# Patient Record
Sex: Female | Born: 1935 | Race: White | Hispanic: No | Smoking: Never smoker
Health system: Southern US, Community
[De-identification: ages and names within clinical notes are randomized; demographics above are authoritative.]

## PROBLEM LIST (undated history)

## (undated) DIAGNOSIS — R112 Nausea with vomiting, unspecified: Secondary | ICD-10-CM

## (undated) DIAGNOSIS — M1990 Unspecified osteoarthritis, unspecified site: Secondary | ICD-10-CM

## (undated) DIAGNOSIS — K219 Gastro-esophageal reflux disease without esophagitis: Secondary | ICD-10-CM

## (undated) DIAGNOSIS — I10 Essential (primary) hypertension: Secondary | ICD-10-CM

## (undated) DIAGNOSIS — Z8489 Family history of other specified conditions: Secondary | ICD-10-CM

## (undated) DIAGNOSIS — I739 Peripheral vascular disease, unspecified: Secondary | ICD-10-CM

## (undated) DIAGNOSIS — D649 Anemia, unspecified: Secondary | ICD-10-CM

## (undated) DIAGNOSIS — C801 Malignant (primary) neoplasm, unspecified: Secondary | ICD-10-CM

## (undated) DIAGNOSIS — Z9889 Other specified postprocedural states: Secondary | ICD-10-CM

## (undated) DIAGNOSIS — K589 Irritable bowel syndrome without diarrhea: Secondary | ICD-10-CM

## (undated) HISTORY — PX: COLONOSCOPY: SHX174

## (undated) HISTORY — PX: TONSILLECTOMY: SUR1361

## (undated) HISTORY — PX: TUMOR REMOVAL: SHX12

## (undated) HISTORY — PX: ROTATOR CUFF REPAIR: SHX139

---

## 2019-05-27 ENCOUNTER — Other Ambulatory Visit: Payer: Self-pay

## 2019-05-27 ENCOUNTER — Other Ambulatory Visit (HOSPITAL_COMMUNITY): Payer: Self-pay | Admitting: Internal Medicine

## 2019-05-27 ENCOUNTER — Encounter (HOSPITAL_BASED_OUTPATIENT_CLINIC_OR_DEPARTMENT_OTHER): Payer: Medicare Other | Attending: Internal Medicine | Admitting: Internal Medicine

## 2019-05-27 ENCOUNTER — Ambulatory Visit (HOSPITAL_COMMUNITY)
Admission: RE | Admit: 2019-05-27 | Discharge: 2019-05-27 | Disposition: A | Discharge status: Home / Self Care | Payer: Medicare Other | Source: Ambulatory Visit | Attending: Internal Medicine | Admitting: Internal Medicine

## 2019-05-27 DIAGNOSIS — E785 Hyperlipidemia, unspecified: Secondary | ICD-10-CM | POA: Diagnosis not present

## 2019-05-27 DIAGNOSIS — I10 Essential (primary) hypertension: Secondary | ICD-10-CM | POA: Diagnosis not present

## 2019-05-27 DIAGNOSIS — L97223 Non-pressure chronic ulcer of left calf with necrosis of muscle: Secondary | ICD-10-CM | POA: Insufficient documentation

## 2019-05-27 DIAGNOSIS — M1990 Unspecified osteoarthritis, unspecified site: Secondary | ICD-10-CM | POA: Diagnosis not present

## 2019-05-27 DIAGNOSIS — Z833 Family history of diabetes mellitus: Secondary | ICD-10-CM | POA: Diagnosis not present

## 2019-05-27 DIAGNOSIS — Z885 Allergy status to narcotic agent status: Secondary | ICD-10-CM | POA: Insufficient documentation

## 2019-05-27 DIAGNOSIS — Z9221 Personal history of antineoplastic chemotherapy: Secondary | ICD-10-CM | POA: Insufficient documentation

## 2019-05-27 DIAGNOSIS — Z923 Personal history of irradiation: Secondary | ICD-10-CM | POA: Diagnosis not present

## 2019-05-27 DIAGNOSIS — Z9289 Personal history of other medical treatment: Secondary | ICD-10-CM

## 2019-05-27 DIAGNOSIS — C8330 Diffuse large B-cell lymphoma, unspecified site: Secondary | ICD-10-CM | POA: Insufficient documentation

## 2019-05-27 DIAGNOSIS — G629 Polyneuropathy, unspecified: Secondary | ICD-10-CM | POA: Diagnosis not present

## 2019-05-27 DIAGNOSIS — Z86718 Personal history of other venous thrombosis and embolism: Secondary | ICD-10-CM | POA: Insufficient documentation

## 2019-05-27 DIAGNOSIS — Z888 Allergy status to other drugs, medicaments and biological substances status: Secondary | ICD-10-CM | POA: Insufficient documentation

## 2019-05-27 DIAGNOSIS — I872 Venous insufficiency (chronic) (peripheral): Secondary | ICD-10-CM | POA: Diagnosis not present

## 2019-05-27 DIAGNOSIS — Z809 Family history of malignant neoplasm, unspecified: Secondary | ICD-10-CM | POA: Insufficient documentation

## 2019-05-27 NOTE — Progress Notes (Signed)
Sara, [name] ([id number]) Visit Report for 05/27/2019 Abuse/Suicide Risk Screen Details Patient Name: Date of Service: [place], [name] 05/27/2019 9:00 A M Medical Record Number: [id number] Patient Account Number: [id number] Date of Birth/Sex: Treating RN: March 25, 1929 (86 y.o. [name] Primary Care Rhilyn Battle: Other Clinician: Referring Daisuke Bailey: Treating Avory Rahimi/Extender: [name] in Treatment: 0 Abuse/Suicide Risk Screen Items Answer ABUSE RISK SCREEN: Has anyone close to you tried to hurt or harm you recentlyo No Do you feel uncomfortable with anyone in your familyo No Has anyone forced you do things that you didnt want to doo No Electronic Signature(s) Signed: 05/27/2019 5:10:43 PM By: Baruch Gouty RN, BSN Entered By: Baruch Gouty on 05/27/2019 09:49:04 -------------------------------------------------------------------------------- Activities of Daily Living Details Patient Name: Date of Service: [place], [name] 05/27/2019 9:00 A M Medical Record Number: [id number] Patient Account Number: [id number] Date of Birth/Sex: Treating RN: [birth year] (86 y.o. [name] Primary Care Jarrod Bodkins: Other Clinician: Referring Tenia Goh: Treating Magdeline Prange/Extender: [name] in Treatment: 0 Activities of Daily Living Items Answer Activities of Daily Living (Please select one for each item) Drive Automobile Completely Able T Medications ake Completely Able Use T elephone Completely Able Care for Appearance Completely Able Use T oilet Completely Able Bath / Shower Completely Able Dress Self Completely Able Feed Self Completely Able Walk Need Assistance Get In / Out Bed Completely Able Housework Need Assistance Prepare Meals Completely [place] for Self Completely Able Notes does not have current driver's Teacher, early years/pre) Signed: 05/27/2019 5:10:43 PM By: Baruch Gouty RN,  BSN Signed: 05/27/2019 5:10:43 PM By: Baruch Gouty RN, BSN Entered By: Baruch Gouty on 05/27/2019 09:49:49 -------------------------------------------------------------------------------- Education Screening Details Patient Name: Date of Service: [name], [name] 05/27/2019 9:00 A M Medical Record Number: [id number] Patient Account Number: [id number] Date of Birth/Sex: Treating RN: 12-03-1929 (86 y.o. [name] Primary Care Bellamarie Pflug: Other Clinician: Referring Madhavi Hamblen: Treating Easter Kennebrew/Extender: [name] in Treatment: 0 Primary Learner Assessed: Patient Learning Preferences/Education Level/Primary Language Learning Preference: Explanation, Demonstration, Printed Material Highest Education Level: College or Above Preferred Language: English Cognitive Barrier Language Barrier: No Translator Needed: No Memory Deficit: No Emotional Barrier: No Cultural/Religious Beliefs Affecting Medical Care: No Physical Barrier Impaired Vision: Yes Glasses, reading Impaired Hearing: No Decreased Hand dexterity: No Knowledge/Comprehension Knowledge Level: High Comprehension Level: High Ability to understand written instructions: High Ability to understand verbal instructions: High Motivation Anxiety Level: Calm Cooperation: Cooperative Education Importance: Acknowledges Need Interest in Health Problems: Asks Questions Perception: Coherent Willingness to Engage in Self-Management High Activities: Readiness to Engage in Self-Management High Activities: Electronic Signature(s) Signed: 05/27/2019 5:10:43 PM By: Baruch Gouty RN, BSN Entered By: Baruch Gouty on 05/27/2019 09:50:27 -------------------------------------------------------------------------------- Fall Risk Assessment Details Patient Name: Date of Service: [name], [name] 05/27/2019 9:00 A M Medical Record Number: [id number] Patient Account Number: [id number] Date of  Birth/Sex: Treating RN: November 03, 1929 (86 y.o. [name] Primary Care Artavia Jeanlouis: Other Clinician: Referring Jessika Rothery: Treating Alexina Niccoli/Extender: [name] in Treatment: 0 Fall Risk Assessment Items Have you had 2 or more falls in the last 12 monthso 0 No Have you had any fall that resulted in injury in the last 12 monthso 0 No FALLS RISK SCREEN History of falling - immediate or within 3 months 0 No Secondary diagnosis (Do you have 2 or more medical diagnoseso) 0 No Ambulatory aid None/bed rest/wheelchair/nurse 0 No Crutches/cane/walker 15 Yes Furniture 0 No Intravenous therapy Access/Saline/Heparin Lock 0 No  Gait/Transferring Normal/ bed rest/ wheelchair 0 Yes Weak (short steps with or without shuffle, stooped but able to lift head while walking, may seek 0 No support from furniture) Impaired (short steps with shuffle, may have difficulty arising from chair, head down, impaired 0 No balance) Mental Status Oriented to own ability 0 Yes Electronic Signature(s) Signed: 05/27/2019 5:10:43 PM By: Baruch Gouty RN, BSN Entered By: Baruch Gouty on 05/27/2019 09:52:06 -------------------------------------------------------------------------------- Foot Assessment Details Patient Name: Date of Service: [name], [name] 05/27/2019 9:00 A M Medical Record Number: [id number] Patient Account Number: [id number] Date of Birth/Sex: Treating RN: Sep 14, 1929 (86 y.o. [name] Primary Care Tsugio Elison: Other Clinician: Referring Samy Ryner: Treating Shyan Scalisi/Extender: [name] in Treatment: 0 Foot Assessment Items Site Locations + = Sensation present, - = Sensation absent, C = Callus, U = Ulcer R = Redness, W = Warmth, M = Maceration, PU = Pre-ulcerative lesion F = Fissure, S = Swelling, D = Dryness Assessment Right: Left: Other Deformity: No No Prior Foot Ulcer: No No Prior Amputation: No No Charcot Joint: No No Ambulatory Status:  Ambulatory With Help Assistance Device: Cane Gait: Steady Electronic Signature(s) Signed: 05/27/2019 5:10:43 PM By: Baruch Gouty RN, BSN Entered By: Baruch Gouty on 05/27/2019 09:56:45 -------------------------------------------------------------------------------- Nutrition Risk Screening Details Patient Name: Date of Service: [name], [name] 05/27/2019 9:00 A M Medical Record Number: [id number] Patient Account Number: [id number] Date of Birth/Sex: Treating RN: [birth year] (86 y.o. [name] Primary Care Drewey Begue: Other Clinician: Referring Tena Linebaugh: Treating Nayla Dias/Extender: [name] in Treatment: 0 Height (in): 65 Weight (lbs): 138 Body Mass Index (BMI): 23 Nutrition Risk Screening Items Score Screening NUTRITION RISK SCREEN: I have an illness or condition that made me change the kind and/or amount of food I eat 0 No I eat fewer than two meals per day 0 No I eat few fruits and vegetables, or milk products 0 No I have three or more drinks of beer, liquor or wine almost every day 0 No I have tooth or mouth problems that make it hard for me to eat 0 No I don't always have enough money to buy the food I need 0 No I eat alone most of the time 1 Yes I take three or more different prescribed or over-the-counter drugs a day 1 Yes Without wanting to, I have lost or gained 10 pounds in the last six months 0 No I am not always physically able to shop, cook and/or feed myself 0 No Nutrition Protocols Good Risk Protocol 0 No interventions needed Moderate Risk Protocol High Risk Proctocol Risk Level: Good Risk Score: 2 Electronic Signature(s) Signed: 05/27/2019 5:10:43 PM By: Baruch Gouty RN, BSN Entered By: Baruch Gouty on 05/27/2019 09:52:39

## 2019-05-27 NOTE — Progress Notes (Signed)
[name], PAGE[place] ([id number]) Visit Report for 05/27/2019 Allergy List Details Patient Name: Date of Service: [name], [name] 05/27/2019 9:00 A M Medical Record Number: [id number] Patient Account Number: [id number] Date of Birth/Sex: Treating RN: 12-14-1929 (86 y.o. [name] Primary Care [name]: Other Clinician: Referring [name]: Treating [name]/Extender: [name] in Treatment: 0 Allergies Active Allergies apixaban Reaction: itching prednisone Reaction: lethargy hydrocodone Reaction: vomiting Allergy Notes Electronic Signature(s) Signed: 05/27/2019 5:10:43 PM By: Baruch Gouty RN, BSN Entered By: Baruch Gouty on 05/27/2019 09:34:53 -------------------------------------------------------------------------------- Arrival Information Details Patient Name: Date of Service: Sara Benne, [name] 05/27/2019 9:00 A M Medical Record Number: [id number] Patient Account Number: [id number] Date of Birth/Sex: Treating RN: [birth year] (86 y.o. [name] Primary Care [name]: Other Clinician: Referring [name]: Treating [name]/Extender: [name] in Treatment: 0 Visit Information Patient Arrived: Wheel Chair Arrival Time: 09:18 Accompanied By: friend Transfer Assistance: None Patient Identification Verified: Yes Secondary Verification Process Completed: Yes Patient Requires Transmission-Based Precautions: No Patient Has Alerts: No Electronic Signature(s) Signed: 05/27/2019 5:10:43 PM By: Baruch Gouty RN, BSN Entered By: Baruch Gouty on 05/27/2019 [id number] -------------------------------------------------------------------------------- Clinic Level of Care Assessment Details Patient Name: Date of Service: [name], [name] 05/27/2019 9:00 A M Medical Record Number: [id number] Patient Account Number: [id number] Date of Birth/Sex: Treating RN: 06-06-1929 (86 y.o. [name] Primary Care [name]: Other  Clinician: Referring [name]: Treating [name]/Extender: [name] in Treatment: 0 Clinic Level of Care Assessment Items TOOL 2 Quantity Score X- 1 0 Use when only an EandM is performed on the INITIAL visit ASSESSMENTS - Nursing Assessment / Reassessment X- 1 20 General Physical Exam (combine w/ comprehensive assessment (listed just below) when performed on new pt. evals) X- 1 25 Comprehensive Assessment (HX, ROS, Risk Assessments, Wounds Hx, etc.) ASSESSMENTS - Wound and Skin A ssessment / Reassessment X - Simple Wound Assessment / Reassessment - one wound 1 5 []  - 0 Complex Wound Assessment / Reassessment - multiple wounds []  - 0 Dermatologic / Skin Assessment (not related to wound area) ASSESSMENTS - Ostomy and/or Continence Assessment and Care []  - 0 Incontinence Assessment and Management []  - 0 Ostomy Care Assessment and Management (repouching, etc.) PROCESS - Coordination of Care X - Simple Patient / Family Education for ongoing care 1 15 []  - 0 Complex (extensive) Patient / Family Education for ongoing care X- 1 10 Staff obtains Programmer, systems, Records, T Results / Process Orders est []  - 0 Staff telephones HHA, Nursing Homes / Clarify orders / etc []  - 0 Routine Transfer to another Facility (non-emergent condition) []  - 0 Routine Hospital Admission (non-emergent condition) X- 1 15 New Admissions / Biomedical engineer / Ordering NPWT Apligraf, etc. , []  - 0 Emergency Hospital Admission (emergent condition) X- 1 10 Simple Discharge Coordination []  - 0 Complex (extensive) Discharge Coordination PROCESS - Special Needs []  - 0 Pediatric / Minor Patient Management []  - 0 Isolation Patient Management []  - 0 Hearing / Language / Visual special needs []  - 0 Assessment of Community assistance (transportation, D/C planning, etc.) []  - 0 Additional assistance / Altered mentation []  - 0 Support Surface(s) Assessment (bed, cushion, seat,  etc.) INTERVENTIONS - Wound Cleansing / Measurement X- 1 5 Wound Imaging (photographs - any number of wounds) []  - 0 Wound Tracing (instead of photographs) X- 1 5 Simple Wound Measurement - one wound []  - 0 Complex Wound Measurement - multiple wounds X- 1 5 Simple Wound Cleansing - one wound []  -  0 Complex Wound Cleansing - multiple wounds INTERVENTIONS - Wound Dressings []  - 0 Small Wound Dressing one or multiple wounds X- 1 15 Medium Wound Dressing one or multiple wounds []  - 0 Large Wound Dressing one or multiple wounds []  - 0 Application of Medications - injection INTERVENTIONS - Miscellaneous []  - 0 External ear exam []  - 0 Specimen Collection (cultures, biopsies, blood, body fluids, etc.) []  - 0 Specimen(s) / Culture(s) sent or taken to Lab for analysis []  - 0 Patient Transfer (multiple staff / Harrel Lemon Lift / Similar devices) []  - 0 Simple Staple / Suture removal (25 or less) []  - 0 Complex Staple / Suture removal (26 or more) []  - 0 Hypo / Hyperglycemic Management (close monitor of Blood Glucose) X- 1 15 Ankle / Brachial Index (ABI) - do not check if billed separately Has the patient been seen at the hospital within the last three years: Yes Total Score: 145 Level Of Care: New/Established - Level 4 Electronic Signature(s) Signed: 05/27/2019 5:53:12 PM By: Sara Coria RN Entered By: [name] on 05/27/2019 17:22:22 -------------------------------------------------------------------------------- Encounter Discharge Information Details Patient Name: Date of Service: Sara Benne, [name] 05/27/2019 9:00 A M Medical Record Number: [id number] Patient Account Number: [id number] Date of Birth/Sex: Treating RN: 1929/08/08 (86 y.o. [name] Primary Care [name]: Other Clinician: Referring [name]: Treating [name]/Extender: [name] in Treatment: 0 Encounter Discharge Information Items Discharge Condition: Stable Ambulatory Status:  Wheelchair Discharge Destination: Home Transportation: Private Auto Accompanied By: friend Schedule Follow-up Appointment: Yes Clinical Summary of Care: Patient Declined Electronic Signature(s) Signed: 05/27/2019 5:14:55 PM By: Kela Millin Entered By: Kela Millin on 05/27/2019 11:07:21 -------------------------------------------------------------------------------- Lower Extremity Assessment Details Patient Name: Date of Service: [place], [name] 05/27/2019 9:00 A M Medical Record Number: [id number] Patient Account Number: [id number] Date of Birth/Sex: Treating RN: 1929-09-26 (86 y.o. [name] Primary Care Gisele Pack: Other Clinician: Referring Morry Veiga: Treating Zidane Renner/Extender: [name] in Treatment: 0 Edema Assessment Assessed: [name]: No] [Right: No] Edema: [Left: Ye] [Right: s] Calf Left: Right: Point of Measurement: cm From Medial Instep 34.8 cm cm Ankle Left: Right: Point of Measurement: cm From Medial Instep 20.1 cm cm Vascular Assessment Pulses: Dorsalis Pedis Palpable: [Left:Yes] Notes ABIs done at [facility] 05/09/19 Electronic Signature(s) Signed: 05/27/2019 5:10:43 PM By: Baruch Gouty RN, BSN Entered By: Baruch Gouty on 05/27/2019 10:01:25 -------------------------------------------------------------------------------- Multi Wound Chart Details Patient Name: Date of Service: Sara Benne, [name] 05/27/2019 9:00 A M Medical Record Number: [id number] Patient Account Number: [id number] Date of Birth/Sex: Treating RN: 04-05-1929 (86 y.o. 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] [N/A:N/A] Primary Etiology: [1:Cataracts, Deep Vein Thrombosis,] [N/A:N/A] Comorbid History: [1:Hypertension, Osteoarthritis, Neuropathy, Received Chemotherapy, Received Radiation, Confinement Anxiety 10/17/2018] [N/A:N/A] Date Acquired: [1:0] [N/A:N/A] Weeks of Treatment: [1:Open] [N/A:N/A] Wound Status: [1:6.8x3.7x0.4] [N/A:N/A] Measurements L x W x D (cm) [1:19.761] [N/A:N/A] A (cm) : rea [1:7.904] [N/A:N/A] Volume (cm) : [1:Full Thickness With Exposed Support] [N/A:N/A] Classification: [1:Structures Medium] [N/A:N/A] Exudate Amount: [1:Purulent] [N/A:N/A] Exudate Type: [1:yellow, brown, green] [N/A:N/A] Exudate Color: [1:Flat and Intact] [N/A:N/A] Wound Margin: [1:Small (1-33%)] [N/A:N/A] Granulation Amount: [1:Pink] [N/A:N/A] Granulation Quality: [1:Large (67-100%)] [N/A:N/A] Necrotic Amount: [1:Fat Layer (Subcutaneous Tissue)] [N/A:N/A] Exposed Structures: [1:Exposed:  Yes Muscle: Yes Fascia: No Tendon: No Joint: No Bone: No None] [N/A:N/A] Treatment Notes Wound #1 (Left, Lateral Lower Leg) 1. Cleanse With Wound Cleanser 2. Periwound Care Skin Prep 3. Primary Dressing Applied [name] Other primary dressing (specifiy in notes) 4. Secondary Dressing ABD Pad Dry Gauze Roll Gauze 5. Secured With Tape Notes saline moistened gauze over santyl. netting Electronic Signature(s) Signed: 05/27/2019 5:53:12 PM By: Sara Coria RN Signed: 05/27/2019 6:13:26 PM By: Linton Ham MD Entered By: Linton Ham on 05/27/2019 13:13:22 -------------------------------------------------------------------------------- Multi-Disciplinary Care Plan Details Patient Name: Date of Service: [place], [name] 05/27/2019 9:00 A M Medical Record Number: [id number] Patient Account Number: [id number] Date of Birth/Sex: Treating RN: 1929/02/08 (86 y.o. [name] Primary Care Fleet Higham: Other  Clinician: Referring Derin Matthes: Treating Jenya Putz/Extender: [name] in Treatment: 0 Active Inactive HBO Nursing Diagnoses: Anxiety related to feelings of confinement associated with the hyperbaric oxygen chamber Anxiety related to knowledge deficit of hyperbaric oxygen therapy and treatment procedures Goals: Patient and/or family will be able to state/discuss factors appropriate to the management of their disease process during treatment Date Initiated: 05/27/2019 Target Resolution Date: 06/27/2019 Goal Status: Active Patient will tolerate the hyperbaric oxygen therapy treatment Date Initiated: 05/27/2019 T arget Resolution Date: 06/27/2019 Goal Status: Active Patient will tolerate the internal climate of the chamber Date Initiated: 05/27/2019 T arget Resolution Date: 06/27/2019 Goal Status: Active Patient/caregiver will verbalize understanding of HBO goals, rationale, procedures and potential hazards Date Initiated: 05/27/2019 T arget Resolution Date: 06/27/2019 Goal Status: Active Interventions: Administer a five (5) minute air break for patient if signs and symptoms of seizure appear and notify the hyperbaric physician Administer decongestants, per physician orders, prior to HBO2 Administer the correct therapeutic gas delivery based on the patients needs and limitations, per physician order Assess and provide for patients comfort related to the hyperbaric environment and equalization of middle ear Assess for signs and symptoms related to adverse events, including but not limited to confinement anxiety, pneumothorax, oxygen toxicity and baurotrauma Assess patient's knowledge and expectations regarding hyperbaric medicine and provide education related to the hyperbaric environment, goals of treatment and prevention of adverse events Notes: Wound/Skin Impairment Nursing Diagnoses: Knowledge deficit related to ulceration/compromised skin integrity Goals: Patient/caregiver  will verbalize understanding of skin care regimen Date Initiated: 05/27/2019 Target Resolution Date: 06/27/2019 Goal Status: Active Ulcer/skin breakdown will have a volume reduction of 30% by week 4 Date Initiated: 05/27/2019 Target Resolution Date: 06/27/2019 Goal Status: Active Interventions: Assess patient/caregiver ability to obtain necessary supplies Assess patient/caregiver ability to perform ulcer/skin care regimen upon admission and as needed Assess ulceration(s) every visit Notes: Electronic Signature(s) Signed: 05/27/2019 5:53:12 PM By: Sara Coria RN Entered By: [name] on 05/27/2019 10:41:02 -------------------------------------------------------------------------------- Pain Assessment Details Patient Name: Date of Service: [name], [name] 05/27/2019 9:00 A M Medical Record Number: [id number] Patient Account Number: [id number] Date of Birth/Sex: Treating RN: Dec 29, 1929 (86 y.o. [name] Primary Care Zahlia Deshazer: Other Clinician: Referring Zackerie Sara: Treating Kathy Wares/Extender: [name] in Treatment: 0 Active Problems Location of Pain Severity and Description of Pain Patient Has Paino Yes Site Locations Pain Location: Pain Location: Pain in Ulcers With Dressing Change: Yes Duration of the Pain. Constant / Intermittento Constant Rate the pain. Current Pain Level: 3 Worst Pain Level: 10 Least Pain Level: 2 Character of Pain Describe the Pain: Sharp, Shooting, Stabbing Pain Management and Medication Current Pain Management: Medication: Yes Is the Current Pain Management Adequate: Adequate Rest: Yes How does your wound impact  your activities of daily livingo Sleep: Yes Bathing: No Appetite: No Relationship With Others: No Bladder Continence: No Emotions: Yes Bowel Continence: No Work: No Toileting: No Drive: No Dressing: No Hobbies: No Electronic Signature(s) Signed: 05/27/2019 5:10:43 PM By: Baruch Gouty RN,  BSN Entered By: Baruch Gouty on 05/27/2019 10:09:07 -------------------------------------------------------------------------------- Patient/Caregiver Education Details Patient Name: Date of Service: Sara Benne, [name] 5/11/2021andnbsp9:00 A M Medical Record Number: [id number] Patient Account Number: [id number] Date of Birth/Gender: Treating RN: [birth year] (86 y.o. [name] Primary Care Physician: Other Clinician: Referring Physician: Treating Physician/Extender: [name] in Treatment: 0 Education Assessment Education Provided To: Patient Education Topics Provided Wound/Skin Impairment: Methods: Explain/Verbal Responses: State content correctly Electronic Signature(s) Signed: 05/27/2019 5:53:12 PM By: Sara Coria RN Entered By: [name] on 05/27/2019 10:41:13 -------------------------------------------------------------------------------- Wound Assessment Details Patient Name: Date of Service: [place], [name] 05/27/2019 9:00 A M Medical Record Number: [id number] Patient Account Number: [id number] Date of Birth/Sex: Treating RN: Sep 08, 1929 (86 y.o. 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 Location: Left, Lateral Lower Leg Wound Open Wounding Event: Bump Status: Date Acquired: 10/17/2018 Comorbid Cataracts, Deep Vein Thrombosis, Hypertension, Osteoarthritis, Weeks Of Treatment: 0 History: Neuropathy, Received Chemotherapy, Received Radiation, Clustered Wound: No Confinement Anxiety Wound Measurements Length: (cm) 6.8 Width: (cm) 3.7 Depth: (cm) 0.4 Area: (cm) 19.761 Volume: (cm) 7.904 % Reduction in Area: % Reduction in Volume: Epithelialization: None Tunneling: No Undermining: No Wound Description Classification: Full Thickness With Exposed  Support Structures Wound Margin: Flat and Intact Exudate Amount: Medium Exudate Type: Purulent Exudate Color: yellow, brown, green Foul Odor After Cleansing: No Slough/Fibrino Yes Wound Bed Granulation Amount: Small (1-33%) Exposed Structure Granulation Quality: Pink Fascia Exposed: No Necrotic Amount: Large (67-100%) Fat Layer (Subcutaneous Tissue) Exposed: Yes Necrotic Quality: Adherent Slough Tendon Exposed: No Muscle Exposed: Yes Necrosis of Muscle: No Joint Exposed: No Bone Exposed: No Treatment Notes Wound #1 (Left, Lateral Lower Leg) 1. Cleanse With Wound Cleanser 2. Periwound Care Skin Prep 3. Primary Dressing Applied [name] Other primary dressing (specifiy in notes) 4. Secondary Dressing ABD Pad Dry Gauze Roll Gauze 5. Secured With Tape Notes saline moistened gauze over santyl. netting Electronic Signature(s) Signed: 05/27/2019 5:10:43 PM By: Baruch Gouty RN, BSN Entered By: Baruch Gouty on 05/27/2019 10:07:47 -------------------------------------------------------------------------------- Vitals Details Patient Name: Date of Service: Sara Benne, [name] 05/27/2019 9:00 A M Medical Record Number: [id number] Patient Account Number: [id number] Date of Birth/Sex: Treating RN: 18-Dec-1929 (86 y.o. [name] Primary Care Arneda Sappington: Other Clinician: Referring Vaidehi Braddy: Treating Ausencio Vaden/Extender: [name] in Treatment: 0 Vital Signs Time Taken: 09:26 Temperature (F): 97.8 Height (in): 65 Pulse (bpm): 62 Source: Stated Respiratory Rate (breaths/min): 18 Weight (lbs): 138 Blood Pressure (mmHg): 115/60 Source: Stated Reference Range: 80 - 120 mg / dl Body Mass Index (BMI): 23 Electronic Signature(s) Signed: 05/27/2019 5:10:43 PM By: Baruch Gouty RN, BSN Entered By: Baruch Gouty on 05/27/2019 09:33:40

## 2019-05-27 NOTE — Progress Notes (Signed)
[name] ([id number]) Visit Report for 05/27/2019 Chief Complaint Document Details Patient Name: Date of Service: [name], [name] 05/27/2019 9:00 A M Medical Record Number: [id number] Patient Account Number: [id number] Date of Birth/Sex: Treating RN: 01/06/1930 (86 y.o. [name] Primary Care Provider: Other Clinician: Referring Provider: Treating Provider/Extender: [name] in Treatment: 0 Information Obtained from: Patient Chief Complaint 05/27/2019; patient is here for review of the wound on her left lateral lower leg Electronic Signature(s) Signed: 05/27/2019 6:13:26 PM By: Linton Ham MD Entered By: Linton Ham on 05/27/2019 13:14:12 -------------------------------------------------------------------------------- HPI Details Patient Name: Date of Service: Sara Benne, [name] 05/27/2019 9:00 A M Medical Record Number: [id number] Patient Account Number: [id number] Date of Birth/Sex: Treating RN: May 31, 1929 (86 y.o. [name] Primary Care Provider: Other Clinician: Referring Provider: Treating Provider/Extender: [name] in Treatment: 0 History of Present Illness HPI Description: ADMISSION 05/27/2019 This is an 86 year old woman who came here for consideration of hyperbaric oxygen. She has a very complicated history. She is been treated for diffuse large B-cell lymphoma since 2006. She has had 2 recurrences of this. Her most recent chemotherapy round was Treanda plus polatuzumab which ended I believe in February. She is not currently on chemotherapy. She came to see [place] with a large wound on the left lateral lower leg. Apparently this started as a lump sometime in October 2020. She received courses of antibiotics which did not help. She then had a biopsy and it actually showed lymphoma. The wound never really healed. And she developed a progressive ulcerated tumor. She underwent a course of radiation in late October into  November 2020. This resulted in some improvement in the mass but she has been left with a deep punched out wound with exposed tendon. She was seen by Dr. Jaclynn Guarneri at the wound care center in [facility]. She considered her for operative debridement and/or consideration of hyperbaric oxygen. She was also seen in April on 05/07/2019 by Dr. Zigmund Daniel at the wound care center in [facility]. Arterial and venous reflux studies were ordered but I am unable to see these results. 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 She has been using [name] for a prolonged period of time for perhaps 5 weeks with some improvement in the wound surface. She has not had the wound rebiopsied but she did have a PET scan on 03/18/2027 that did show residual infiltrating soft tissue density involving the left lower calf but without discrete measurable mass and minimal uptake. 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 At the same time there was no new areas of metabolically active lymphoma identified. She therefore has not been on any ongoing chemotherapy. She also had a CT scan of the left lower extremity on 05/21/2019 that did not show any fracture or dislocation of the left tibia or fibula large soft tissue ulcer overlying the distal tibia no evidence of bony erosion or sclerosis to suggest osteomyelitis. 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 , The patient has had prior recent arterial studies we did not do an ABI in this clinic  today. Electronic Signature(s) Signed: 05/27/2019 6:13:26 PM By: Linton Ham  MD Entered By: Linton Ham on 05/27/2019 18:04:17 -------------------------------------------------------------------------------- Physical Exam Details Patient Name: Date of Service: Sara RER, [name] 05/27/2019 9:00 A M Medical Record Number: [id number] Patient Account Number: [id number] Date of Birth/Sex: Treating RN: October 02, 1929 (86 y.o. [name] Primary Care Provider: Other Clinician: Referring Provider: Treating Provider/Extender: [name] in Treatment: 0 Constitutional Sitting or standing Blood Pressure is within target range for patient.. Pulse regular and within target range for patient.Marland Kitchen Respirations regular, non-labored and within target range.. Temperature is normal and within the target range for the patient.Marland Kitchen Appears in no distress. Respiratory work of breathing is normal. Cardiovascular Harsh systolic murmur 3 out of 6 radiating into the left greater than right carotid. Popliteal pulses palpable.. Dorsalis pedis and posterior tibial pulses are palpable. Integumentary (Hair, Skin) No erythema around the wound. Psychiatric appears at normal baseline. Notes Wound exam; the patient has a large punched-out wound on the left lateral calf. Mid aspect of this has an exposed tendon. 100% of this is covered in tightly adherent yellow debris. This did not wash off with wound cleanser and gauze however elected not to go ahead and do mechanical debridement on this at this point. There is no undermining areas. Electronic Signature(s) Signed: 05/27/2019 6:13:26 PM By: Linton Ham MD Entered By: Linton Ham on 05/27/2019 18:05:52 -------------------------------------------------------------------------------- Physician Orders Details Patient Name: Date of Service: Sara RER, [name] 05/27/2019 9:00 A M Medical Record Number: [id number] Patient Account Number: [id number] Date of Birth/Sex: Treating RN: [birth year] (86 y.o. [name] Primary  Care Provider: Other Clinician: Referring Provider: Treating Provider/Extender: [name] in Treatment: 0 Verbal / Phone Orders: No Diagnosis Coding Follow-up Appointments Return Appointment in 1 week. Dressing Change Frequency Change dressing every day. Wound Cleansing Wound #1 Left,Lateral Lower Leg May shower and wash wound with soap and water. Primary Wound Dressing [name] Ointment Secondary Dressing Kerlix/Rolled Gauze - secure with tape Dry Gauze Hyperbaric Oxygen Therapy Indication: - soft tissue radiation necrosis If appropriate for treatment, begin HBOT per protocol: 2.0 ATA for 90 Minutes without A Breaks ir Total Number of Treatments: - 40 treatments One treatments per day (delivered Monday through Friday unless otherwise specified in Special Instructions below): A ntihistamine 30 minutes prior to HBO Treatment, difficulty clearing ears. Radiology X-ray, Chest - pre HBO [employer] EKG - pre HBO Electronic Signature(s) Signed: 05/27/2019 6:13:26 PM By: Linton Ham MD Previous Signature: 05/27/2019 5:53:12 PM Version By: Carlene Coria RN Entered By: Linton Ham on 05/27/2019 18:05:19 Prescription 05/27/2019 -------------------------------------------------------------------------------- Burback, Deasha A. Linton Ham MD Patient Name: Provider: May 04, 1929 [id number] Date of Birth: NPI#: [id number] Sex: DEA #: [phone] [id number] Phone #: License #: [place] Patient Address: [address] [place], [place] [address] [place], [place] [address] [phone] Allergies Name Reaction Severity apixaban itching prednisone lethargy hydrocodone vomiting Provider's Orders EKG - pre HBO Hand Signature: Date(s): Prescription 05/27/2019 Creque, Jen A. Linton Ham MD Patient Name: Provider: 1929-08-10 [id number] Date of Birth: NPI#: [id number]  [id number] Sex: DEA #: [phone] [id number] Phone #: License #: [place] Patient Address: [address] [place], [place] [address] [place],  [address] [phone] Allergies Name Name Reaction Severity apixaban itching prednisone lethargy hydrocodone vomiting Provider's Orders X-ray, Chest - pre HBO Hand Signature: Date(s): Electronic Signature(s)  Signed: 05/27/2019 6:13:26 PM By: Linton Ham MD Previous Signature: 05/27/2019 5:53:12 PM Version By: Carlene Coria RN Entered By: Linton Ham on 05/27/2019 18:05:20 -------------------------------------------------------------------------------- Problem List Details Patient Name: Date of Service: [facility], [name] 05/27/2019 9:00 A M Medical Record Number: [id number] Patient Account Number: [id number] Date of Birth/Sex: Treating RN: 10/19/1929 (86 y.o. 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 05/27/2019 9:00 A M Medical Record Number: [id number] Patient Account Number: [id number] Date of Birth/Sex: Treating RN: [birth year] (86 y.o. [name] Primary Care Provider: Other  Clinician: Referring Provider: Treating Provider/Extender: [name] in Treatment: 0 Subjective Chief Complaint Information obtained from Patient 05/27/2019; patient is here for review of the wound on her left lateral lower leg History of Present Illness (HPI) ADMISSION 05/27/2019 This is an 86 year old woman who came here for consideration of hyperbaric oxygen. She has a very complicated history. She is been treated for diffuse large B-cell lymphoma since 2006. She has had 2 recurrences of this. Her most recent chemotherapy round was Treanda plus polatuzumab which ended I believe in February. She is not currently on chemotherapy. She came to see [place] with a large wound on the left lateral lower leg. Apparently this started as a lump sometime in October 2020. She received courses of antibiotics which did not help. She then had a biopsy and it actually showed lymphoma. The wound never really healed. And she developed a progressive ulcerated tumor. She underwent a course of radiation in late October into November 2020. This resulted in some improvement in the mass but she has been left with a deep punched out wound with exposed tendon. She was seen by Dr. Jaclynn Guarneri at the wound care center in [facility]. She considered her for operative debridement and/or consideration of hyperbaric oxygen. She was also seen in April on 05/07/2019 by Dr. Zigmund Daniel at the wound care center in [facility]. Arterial and venous reflux studies were ordered but I am unable to see these results. 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 She has been using [name] for a prolonged period of time for perhaps 5 weeks with some improvement in the wound surface. She has not had the wound  rebiopsied but she did have a PET scan on 03/18/2027 that did show residual infiltrating soft tissue density involving the left lower calf but without discrete measurable mass and minimal uptake. 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 At the same time there was no new areas of metabolically active lymphoma identified. She therefore has not been on any ongoing chemotherapy. She also had a CT scan of the left lower extremity on 05/21/2019 that did not show  any fracture or dislocation of the left tibia or fibula large soft tissue ulcer overlying the distal tibia no evidence of bony erosion or sclerosis to suggest osteomyelitis. 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 , The patient has had prior recent arterial studies we did not do an ABI in this clinic today. Patient History Information obtained from Patient. Allergies apixaban (Reaction: itching), prednisone (Reaction: lethargy), hydrocodone (Reaction: vomiting) Family History Cancer - Mother, Diabetes - Child, No family history of Heart Disease, Hereditary Spherocytosis, Hypertension, Kidney Disease, Lung Disease, Seizures, Stroke, Thyroid Problems, Tuberculosis. Social History Never smoker, Marital Status - Widowed, Alcohol Use - Daily - 1 cocktail per night, Drug Use - No History, Caffeine Use - Daily - coffee. 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 Medical A Surgical History Notes nd Constitutional Symptoms (General Health) pain affecting ADLs Cardiovascular hyperlipidemia Gastrointestinal GERD, constipatioin Oncologic large cell malignant lymphoma, nonhodgkin's lymphoma Review of Systems (ROS) Constitutional Symptoms (General Health) Complains or has symptoms of Fatigue. Eyes Complains or has symptoms of Glasses / Contacts - reading. Ear/Nose/Mouth/Throat Denies complaints or symptoms of Chronic sinus problems or rhinitis. Respiratory Denies complaints or symptoms of Chronic or frequent coughs, Shortness of Breath. Gastrointestinal Complains or has symptoms of Nausea - due to pain meds. Endocrine Denies complaints or symptoms of Heat/cold intolerance. Genitourinary Denies complaints or symptoms of Frequent urination. Integumentary (Skin) Complains or has symptoms of Wounds - left lower leg. Musculoskeletal Denies complaints or symptoms of Muscle Pain, Muscle Weakness. Neurologic Denies complaints or symptoms of Numbness/parasthesias. Psychiatric Denies complaints or symptoms of Claustrophobia, Suicidal. Objective Constitutional Sitting or standing Blood Pressure is within target range for patient.. Pulse regular and within target range for patient.Marland Kitchen Respirations regular, non-labored and within target range.. Temperature is normal and within the target range for the patient.Marland Kitchen Appears in no distress. Vitals Time Taken: 9:26 AM, Height: 65 in, Source: Stated, Weight: 138 lbs, Source: Stated, BMI: 23, Temperature: 97.8 F, Pulse: 62 bpm, Respiratory Rate: 18 breaths/min, Blood Pressure: 115/60 mmHg. Respiratory work of breathing is normal. Cardiovascular Harsh systolic  murmur 3 out of 6 radiating into the left greater than right carotid. Popliteal pulses palpable.. Dorsalis pedis and posterior tibial pulses are palpable. Psychiatric appears at normal baseline. General Notes: Wound exam; the patient has a large punched-out wound on the left lateral calf. Mid aspect of this has an exposed tendon. 100% of this is covered in tightly adherent yellow debris. This did not wash off with wound cleanser and gauze however elected not to go ahead and do mechanical debridement on this at this point. There is no undermining areas. Integumentary (Hair, Skin) No erythema around the wound. Wound #1 status is Open. Original cause of wound was Bump. The wound is located on the Left,Lateral Lower Leg. The wound measures 6.8cm length x 3.7cm width x 0.4cm depth; 19.761cm^2 area and 7.904cm^3 volume. There  is muscle and Fat Layer (Subcutaneous Tissue) Exposed exposed. There is no tunneling or undermining noted. There is a medium amount of purulent drainage noted. The wound margin is flat and intact. There is small (1-33%) pink granulation within the wound bed. There is a large (67-100%) amount of necrotic tissue within the wound bed including Adherent Slough. Assessment Active Problems ICD-10 Non-pressure chronic ulcer of left calf with necrosis of muscle Disorder of the skin and subcutaneous tissue related to radiation, unspecified Diffuse large B-cell lymphoma, unspecified site Plan Follow-up Appointments: Return Appointment in 1 week. Dressing Change Frequency: Change dressing every day. Wound Cleansing: Wound #1 Left,Lateral Lower Leg: May shower and wash wound with soap and water. Primary Wound Dressing: [name] Ointment Secondary Dressing: Kerlix/Rolled Gauze - secure with tape Dry Gauze Hyperbaric Oxygen Therapy: Indication: - soft tissue radiation necrosis If appropriate for treatment, begin HBOT per protocol: 2.0 ATA for 90 Minutes without Air Breaks T Number  of Treatments: - 40 treatments otal One treatments per day (delivered Monday through Friday unless otherwise specified in Special Instructions below): Antihistamine 30 minutes prior to HBO Treatment, difficulty clearing ears. ordered were: EKG - pre HBO Radiology ordered were: X-ray, Chest - pre HBO 1. I would continue the [name] as she is doing. The surface of the wound is certainly nonviable however apparently there has been some improvement 2. She has some degree of chronic venous insufficiency but does not tolerate compression. 3. She has had arterial and venous studies done ordered by Dr. Zigmund Daniel at [facility]. We cannot access these studies and I would like to see these 4. I think the patient p has radiation soft tissue damage as a cause for this worsening nonhealing wound. As such she is eligible for hyperbaric treatment. I would use 2 atm without air breaks for 40 treatments 5. I do not think that operative debridement is the correct course at this point I would like to treat her with hyperbarics first before considering any debridement. She has apparently also on anticoagulation according to her records although I did not see this on her medication list today. I will need to clarify this with her next visit 6. She has findings on exam compatible with aortic stenosis. I will need to research this further before committing her to hyperbarics. We will need to see if she has had an echocardiogram I spent 55 minutes in review of this patient's past medical history, face-to-face evaluation and preparation of this record Electronic Signature(s) Signed: 05/27/2019 6:13:26 PM By: Linton Ham MD Entered By: Linton Ham on 05/27/2019 18:12:23 -------------------------------------------------------------------------------- HxROS Details Patient Name: Date of Service: Sara Benne, [name] 05/27/2019 9:00 A M Medical Record Number: [id number] Patient Account Number: [id number] Date of  Birth/Sex: Treating RN: Oct 11, 1929 (86 y.o. [name] Primary Care Provider: Other Clinician: Referring Provider: Treating Provider/Extender: [name] in Treatment: 0 Information Obtained From Patient Constitutional Symptoms ([place]) Complaints and Symptoms: Positive for: Fatigue Medical History: Past Medical History Notes: pain affecting ADLs Eyes Complaints and Symptoms: Positive for: Glasses / Contacts - reading Medical History: Positive for: Cataracts - bil removed Negative for: Glaucoma; Optic Neuritis Ear/Nose/Mouth/Throat Complaints and Symptoms: Negative for: Chronic sinus problems or rhinitis Respiratory Complaints and Symptoms: Negative for: Chronic or frequent coughs; Shortness of Breath Gastrointestinal Complaints and Symptoms: Positive for: Nausea - due to pain meds Medical History: Past Medical History Notes: GERD, constipatioin Endocrine Complaints and Symptoms: Negative for: Heat/cold intolerance Genitourinary Complaints and Symptoms: Negative for: Frequent urination Medical  History: Negative for: End Stage Renal Disease Integumentary (Skin) Complaints and Symptoms: Positive for: Wounds - left lower leg Medical History: Negative for: History of Burn Musculoskeletal Complaints and Symptoms: Negative for: Muscle Pain; Muscle Weakness Medical History: Positive for: Osteoarthritis Negative for: Gout; Osteomyelitis Neurologic Complaints and Symptoms: Negative for: Numbness/parasthesias Medical History: Positive for: Neuropathy - right leg secondary to chemo Psychiatric Complaints and Symptoms: Negative for: Claustrophobia; Suicidal Medical History: Positive for: Confinement Anxiety - mild Negative for: Anorexia/bulimia Hematologic/Lymphatic Cardiovascular Medical History: Positive for: Deep Vein Thrombosis - left leg; Hypertension Past Medical History Notes: hyperlipidemia Immunological Oncologic Medical  History: Positive for: Received Chemotherapy; Received Radiation Past Medical History Notes: large cell malignant lymphoma, nonhodgkin's lymphoma HBO Extended History Items Eyes: Cataracts Immunizations Pneumococcal Vaccine: Received Pneumococcal Vaccination: Yes Implantable Devices Yes Hospitalization / Surgery History Type of Hospitalization/Surgery portacath insertion Family and Social History Cancer: Yes - Mother; Diabetes: Yes - Child; Heart Disease: No; Hereditary Spherocytosis: No; Hypertension: No; Kidney Disease: No; Lung Disease: No; Seizures: No; Stroke: No; Thyroid Problems: No; Tuberculosis: No; Never smoker; Marital Status - Widowed; Alcohol Use: Daily - 1 cocktail per night; Drug Use: No History; Caffeine Use: Daily - coffee; Financial Concerns: No; Food, Clothing or Shelter Needs: No; Support System Lacking: No; Transportation Concerns: Yes Electronic Signature(s) Signed: 05/27/2019 5:10:43 PM By: Baruch Gouty RN, BSN Signed: 05/27/2019 6:13:26 PM By: Linton Ham MD Entered By: Baruch Gouty on 05/27/2019 09:48:49 -------------------------------------------------------------------------------- SuperBill Details Patient Name: Date of Service: [name] 05/27/2019 Medical Record Number: [id number] Patient Account Number: [id number] Date of Birth/Sex: Treating RN: Sep 27, 1929 (86 y.o. [name] Primary Care Provider: Other Clinician: Referring Provider: Treating Provider/Extender: [name] in Treatment: 0 Diagnosis Coding ICD-10 Codes Code Description [phone] Non-pressure chronic ulcer of left calf with necrosis of muscle L59.9 Disorder of the skin and subcutaneous tissue related to radiation, unspecified C83.30 Diffuse large B-cell lymphoma, unspecified site Facility Procedures CPT4 Code: [id number] Description: 47158 - WOUND CARE VISIT-LEV 4 EST PT Modifier: Quantity: 1 Physician Procedures : CPT4 Code Description  Modifier [id number] - WC PHYS LEVEL 4 - NEW PT ICD-10 Diagnosis Description 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

## 2019-06-03 ENCOUNTER — Other Ambulatory Visit: Payer: Self-pay

## 2019-06-03 ENCOUNTER — Encounter (HOSPITAL_BASED_OUTPATIENT_CLINIC_OR_DEPARTMENT_OTHER): Payer: Medicare Other | Admitting: Internal Medicine

## 2019-06-03 ENCOUNTER — Ambulatory Visit (INDEPENDENT_AMBULATORY_CARE_PROVIDER_SITE_OTHER): Payer: Medicare Other | Admitting: Cardiovascular Disease

## 2019-06-03 ENCOUNTER — Encounter: Payer: Self-pay | Admitting: Cardiovascular Disease

## 2019-06-03 VITALS — BP 124/70 | Temp 97.3°F | Ht 65.0 in | Wt 140.6 lb

## 2019-06-03 DIAGNOSIS — R011 Cardiac murmur, unspecified: Secondary | ICD-10-CM | POA: Insufficient documentation

## 2019-06-03 DIAGNOSIS — R0989 Other specified symptoms and signs involving the circulatory and respiratory systems: Secondary | ICD-10-CM | POA: Diagnosis not present

## 2019-06-03 NOTE — Progress Notes (Signed)
06/03/2019 Bobbye Riggs   04-10-1929  [id number]  Primary Physician Patient, No Pcp Per Primary Cardiologist: Lorretta Harp MD Lupe Carney, [place]  HPI:  [name] is a 86 y.o. thin appearing widowed Caucasian female mother of 2 daughters, grandmother of 5 grandchildren referred by Dr. Dellia Nims for cardiovascular dilation because of an auscultated murmur.  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  She has no cardiac risk factors.  She drinks 1 shot of Shearon Stalls nightly.  There is no family history of heart disease.  She is never had heart attack or stroke.  She has chronic mild dyspnea but denies chest pain.  She has had non-Hodgkin's lymphoma since 2006 and has been undergoing chemotherapy.  She had a lesion on her leg and underwent radiation therapy and developed a radiation-induced wound and apparently needs hyperbaric oxygen.  A murmur was auscultated and she was referred here for further evaluation.   Current Meds  Medication Sig  . citalopram (CELEXA) 20 MG tablet TAKE 1 TABLET DAILY  . collagenase ([name]) ointment   . diclofenac Sodium (VOLTAREN) 1 % GEL Apply topically twice a day as needed  . lidocaine-prilocaine (EMLA) cream Apply to wound daily  . omeprazole (PRILOSEC) 20 MG capsule TAKE 1 CAPSULE DAILY  . ondansetron (ZOFRAN) 8 MG tablet Take by mouth.     Allergies  Allergen Reactions  . Prednisone Other (See Comments)  . Apixaban Itching    Rash, muscle pain, and and weakness also presented  . Hydrocodone-Acetaminophen Nausea And Vomiting    Social History   Socioeconomic History  . Marital status: Widowed    Spouse name: Not on file  . Number of children: Not on file  . Years of education: Not on file  . Highest education level: Not on file  Occupational History  . Not on file  Tobacco Use  . Smoking status: Not on file  Substance and Sexual Activity  . Alcohol use: Not on file  . Drug use: Not on file  . Sexual  activity: Not on file  Other Topics Concern  . Not on file  Social History Narrative  . Not on file   Social Determinants of Health   Financial Resource Strain:   . Difficulty of Paying Living Expenses:   Food Insecurity:   . Worried About Charity fundraiser in the Last Year:   . Arboriculturist in the Last Year:   Transportation Needs:   . Film/video editor (Medical):   Marland Kitchen Lack of Transportation (Non-Medical):   Physical Activity:   . Days of Exercise per Week:   . Minutes of Exercise per Session:   Stress:   . Feeling of Stress :   Social Connections:   . Frequency of Communication with Friends and Family:   . Frequency of Social Gatherings with Friends and Family:   . Attends Religious Services:   . Active Member of Clubs or Organizations:   . Attends Archivist Meetings:   Marland Kitchen Marital Status:   Intimate Partner Violence:   . Fear of Current or Ex-Partner:   . Emotionally Abused:   Marland Kitchen Physically Abused:   . Sexually Abused:      Review of Systems: General: negative for chills, fever, night sweats or weight changes.  Cardiovascular: negative for chest pain, dyspnea on exertion, edema, orthopnea, palpitations, paroxysmal nocturnal dyspnea or shortness of breath Dermatological: negative for rash Respiratory: negative  for cough or wheezing Urologic: negative for hematuria Abdominal: negative for nausea, vomiting, diarrhea, bright red blood per rectum, melena, or hematemesis Neurologic: negative for visual changes, syncope, or dizziness All other systems reviewed and are otherwise negative except as noted above.    Blood pressure 124/70, temperature (!) 97.3 F (36.3 C), height 5\' 5"  (1.651 m), weight 140 lb 9.6 oz (63.8 kg), SpO2 96 %.  General appearance: alert and no distress Neck: no adenopathy, no JVD, supple, symmetrical, trachea midline, thyroid not enlarged, symmetric, no tenderness/mass/nodules and Bilateral carotid bruits Lungs: clear to  auscultation bilaterally Heart: 2/6 high-pitched outflow tract murmur consistent with aortic stenosis Extremities: extremities normal, atraumatic, no cyanosis or edema Pulses: 2+ and symmetric Skin: Skin color, texture, turgor normal. No rashes or lesions Neurologic: Alert and oriented X 3, normal strength and tone. Normal symmetric reflexes. Normal coordination and gait  EKG sinus rhythm at 73 with low bundle branch block.  I personally reviewed this EKG.  ASSESSMENT AND PLAN:   Cardiac murmur [name] was referred to me by Dr. Dellia Nims for evaluation of a cardiac murmur.  She has a history of mild aortic stenosis by 2D echo performed 06/06/2016.  She has chronic mild dyspnea on exertion.  She apparently has a radiation-induced wound on her leg and needs hyperbaric oxygen as part of her wound care.  She was referred by Dr. Dellia Nims to evaluate the memory and to rule out critical aortic stenosis which would be a contraindication.      Lorretta Harp MD FACP,FACC,FAHA, [facility] 06/03/2019 2:57 PM

## 2019-06-03 NOTE — Patient Instructions (Signed)
Medication Instructions:  Your physician recommends that you continue on your current medications as directed. Please refer to the Current Medication list given to you today.  *If you need a refill on your cardiac medications before your next appointment, please call your pharmacy*   Lab Work: NONE ordered at this time of appointment   If you have labs (blood work) drawn today and your tests are completely normal, you will receive your results only by: Marland Kitchen MyChart Message (if you have MyChart) OR . A paper copy in the mail If you have any lab test that is abnormal or we need to change your treatment, we will call you to review the results.  Testing/Procedures: Your physician has requested that you have an echocardiogram. Echocardiography is a painless test that uses sound waves to create images of your heart. It provides your doctor with information about the size and shape of your heart and how well your heart's chambers and valves are working. This procedure takes approximately one hour. There are no restrictions for this procedure.   Please schedule for 1-2 weeks   Your physician has requested that you have a carotid duplex. This test is an ultrasound of the carotid arteries in your neck. It looks at blood flow through these arteries that supply the brain with blood. Allow one hour for this exam. There are no restrictions or special instructions.    Please schedule for 1-2 weeks    Follow-Up: At [facility], you and your health needs are our priority.  As part of our continuing mission to provide you with exceptional heart care, we have created designated Provider Care Teams.  These Care Teams include your primary Cardiologist (physician) and Advanced Practice Providers (APPs -  Physician Assistants and Nurse Practitioners) who all work together to provide you with the care you need, when you need it.  We recommend signing up for the patient portal called "MyChart".  Sign up  information is provided on this After Visit Summary.  MyChart is used to connect with patients for Virtual Visits (Telemedicine).  Patients are able to view lab/test results, encounter notes, upcoming appointments, etc.  Non-urgent messages can be sent to your provider as well.   To learn more about what you can do with MyChart, go to [email or web address].    Your next appointment:    AS NEEDED   The format for your next appointment:   In Person  Provider:   Quay Burow, MD  Other Instructions

## 2019-06-03 NOTE — Assessment & Plan Note (Signed)
[name] was referred to me by Dr. Dellia Nims for evaluation of a cardiac murmur.  She has a history of mild aortic stenosis by 2D echo performed 06/06/2016.  She has chronic mild dyspnea on exertion.  She apparently has a radiation-induced wound on her leg and needs hyperbaric oxygen as part of her wound care.  She was referred by Dr. Dellia Nims to evaluate the memory and to rule out critical aortic stenosis which would be a contraindication.

## 2019-06-05 NOTE — Progress Notes (Signed)
[name], [name] ([id number]) Visit Report for 06/03/2019 Arrival Information Details Patient Name: Date of Service: [name], [name] 06/03/2019 12:45 PM Medical Record Number: [id number] Patient Account Number: [id number] Date of Birth/Sex: Treating RN: 28-May-1929 (86 y.o. [name] Primary Care [name]: Other Clinician: Referring [name]: Treating [name]/Extender: [name] in Treatment: 1 Visit Information History Since Last Visit All ordered tests and consults were completed: Yes Patient Arrived: [name] Added or deleted any medications: No Arrival Time: 13:10 Any new allergies or adverse reactions: No Accompanied By: daughter Had a fall or experienced change in No Transfer Assistance: None activities of daily living that may affect Patient Identification Verified: Yes risk of falls: Secondary Verification Process Completed: Yes Signs or symptoms of abuse/neglect since last visito No Patient Requires Transmission-Based Precautions: No Hospitalized since last visit: No Patient Has Alerts: No Implantable device outside of the clinic excluding No cellular tissue based products placed in the center since last visit: Has Dressing in Place as Prescribed: Yes Pain Present Now: No Electronic Signature(s) Signed: 06/03/2019 5:47:21 PM By: Kela Millin Entered By: Kela Millin on 06/03/2019 13:11:14 -------------------------------------------------------------------------------- Encounter Discharge Information Details Patient Name: Date of Service: Sara Benne, [name] 06/03/2019 12:45 PM Medical Record Number: [id number] Patient Account Number: [id number] Date of Birth/Sex: Treating RN: [birth year] (86 y.o. [name] Primary Care [name]: Other Clinician: Referring [name]: Treating [name]/Extender: [name] in Treatment: 1 Encounter Discharge Information Items Discharge Condition: Stable Ambulatory Status:  [place] Discharge Destination: Home Transportation: Private Auto Accompanied By: daughter Schedule Follow-up Appointment: Yes Clinical Summary of Care: Patient Declined Electronic Signature(s) Signed: 06/03/2019 5:47:21 PM By: Kela Millin Entered By: Kela Millin on 06/03/2019 14:03:36 -------------------------------------------------------------------------------- Lower Extremity Assessment Details Patient Name: Date of Service: [name], [name] Sara A. 06/03/2019 12:45 PM Medical Record Number: [id number] Patient Account Number: [id number] Date of Birth/Sex: Treating RN: 05-30-1929 (86 y.o. [name] Primary Care [name]: Other Clinician: Referring [name]: Treating [name]/Extender: [name] in Treatment: 1 Edema Assessment Assessed: [name]: No] [Right: No] Edema: [Left: Ye] [Right: s] Calf Left: Right: Point of Measurement: cm From Medial Instep 36 cm cm Ankle Left: Right: Point of Measurement: cm From Medial Instep 20 cm cm Vascular Assessment Pulses: Dorsalis Pedis Palpable: [Left:Yes] Electronic Signature(s) Signed: 06/03/2019 5:47:21 PM By: Kela Millin Entered By: Kela Millin on 06/03/2019 13:12:30 -------------------------------------------------------------------------------- Multi Wound Chart Details Patient Name: Date of Service: Sara Benne, [name] 06/03/2019 12:45 PM Medical Record Number: [id number] Patient Account Number: [id number] Date of Birth/Sex: Treating RN: Oct 08, 1929 (86 y.o. 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] [N/A:N/A] Primary Etiology: [1:Cataracts, Deep Vein Thrombosis,] [N/A:N/A] Comorbid History: [1:Hypertension, Osteoarthritis, Neuropathy, Received Chemotherapy, Received Radiation, Confinement Anxiety 10/17/2018] [N/A:N/A] Date Acquired: [1:1] [N/A:N/A] Weeks of Treatment: [1:Open] [N/A:N/A] Wound Status: [1:6.8x3.6x1] [N/A:N/A] Measurements L x W x D (cm) [1:19.227] [N/A:N/A] A (cm) : rea [1:19.227] [N/A:N/A] Volume (cm) : [1:2.70%] [N/A:N/A] % Reduction in A rea: [1:-143.30%] [N/A:N/A] % Reduction in Volume: [1:11] Starting Position 1 (o'clock): [1:3] Ending Position 1 (o'clock): [1:0.3] Maximum Distance 1 (cm): [1:Yes] [N/A:N/A] Undermining: [1:Full Thickness With Exposed Support] [N/A:N/A] Classification: [1:Structures Medium] [N/A:N/A] Exudate Amount: [1:Serous] [N/A:N/A] Exudate Type: [1:amber] [N/A:N/A] Exudate Color: [1:Flat and  Intact] [N/A:N/A] Wound Margin: [1:Small (1-33%)] [N/A:N/A] Granulation Amount: [1:Pink] [N/A:N/A] Granulation Quality: [1:Large (67-100%)] [N/A:N/A] Necrotic Amount: [1:Fat Layer (Subcutaneous Tissue)] [N/A:N/A] Exposed Structures: [1:Exposed: Yes Tendon: Yes Muscle: Yes Fascia: No Joint: No Bone: No None] [N/A:N/A] Treatment Notes Electronic Signature(s) Signed: 06/03/2019 5:13:38 PM By: Sara Coria RN Signed: 06/05/2019 12:52:55 PM By: Sara Ham MD Entered By: [name] on 06/03/2019 13:59:26 -------------------------------------------------------------------------------- Multi-Disciplinary Care Plan Details Patient Name: Date of Service: [name], [name] Sara A. 06/03/2019 12:45 PM Medical Record Number: [id number] Patient Account Number: [id number] Date of Birth/Sex: Treating RN: [birth year] (86 y.o. [name] Primary Care [name]: Other Clinician: Referring Jonnette Nuon: Treating Sonu Kruckenberg/Extender: [name] in Treatment: 1 Active Inactive HBO Nursing Diagnoses: Anxiety related to  feelings of confinement associated with the hyperbaric oxygen chamber Anxiety related to knowledge deficit of hyperbaric oxygen therapy and treatment procedures Goals: Patient and/or family will be able to state/discuss factors appropriate to the management of their disease process during treatment Date Initiated: 05/27/2019 T arget Resolution Date: 06/27/2019 Goal Status: Active Patient will tolerate the hyperbaric oxygen therapy treatment Date Initiated: 05/27/2019 T arget Resolution Date: 06/27/2019 Goal Status: Active Patient will tolerate the internal climate of the chamber Date Initiated: 05/27/2019 T arget Resolution Date: 06/27/2019 Goal Status: Active Patient/caregiver will verbalize understanding of HBO goals, rationale, procedures and potential hazards Date Initiated: 05/27/2019 T arget Resolution Date: 06/27/2019 Goal Status: Active Interventions: Administer a five (5) minute air break for patient if signs and symptoms of seizure appear and notify the hyperbaric physician Administer decongestants, per physician orders, prior to HBO2 Administer the correct therapeutic gas delivery based on the patients needs and limitations, per physician order Assess and provide for patients comfort related to the hyperbaric environment and equalization of middle ear Assess for signs and symptoms related to adverse events, including but not limited to confinement anxiety, pneumothorax, oxygen toxicity and baurotrauma Assess patient's knowledge and expectations regarding hyperbaric medicine and provide education related to the hyperbaric environment, goals of treatment and prevention of adverse events Notes: Wound/Skin Impairment Nursing Diagnoses: Knowledge deficit related to ulceration/compromised skin integrity Goals: Patient/caregiver will verbalize understanding of skin care regimen Date Initiated: 05/27/2019 Target Resolution Date: 06/27/2019 Goal Status: Active Ulcer/skin breakdown will  have a volume reduction of 30% by week 4 Date Initiated: 05/27/2019 Target Resolution Date: 06/27/2019 Goal Status: Active Interventions: Assess patient/caregiver ability to obtain necessary supplies Assess patient/caregiver ability to perform ulcer/skin care regimen upon admission and as needed Assess ulceration(s) every visit Notes: Electronic Signature(s) Signed: 06/03/2019 5:13:38 PM By: Sara Coria RN Entered By: [name] on 06/03/2019 12:57:57 -------------------------------------------------------------------------------- Pain Assessment Details Patient Name: Date of Service: [name], [name] Sara A. 06/03/2019 12:45 PM Medical Record Number: [id number] Patient Account Number: [id number] Date of Birth/Sex: Treating RN: 05-May-1929 (86 y.o. [name] Primary Care Jaymarie Yeakel: Other Clinician: Referring Tommy Goostree: Treating Pearlie Nies/Extender: [name] in Treatment: 1 Active Problems Location of Pain Severity and Description of Pain Patient Has Paino No Site Locations Pain Management and Medication Current Pain Management: Electronic Signature(s) Signed: 06/03/2019 5:47:21 PM By: Kela Millin Entered By: Kela Millin on 06/03/2019 13:12:06 -------------------------------------------------------------------------------- Patient/Caregiver Education Details Patient Name: Date of Service: Sara Benne, PA Sara Loni Muse 5/18/2021andnbsp12:45 PM Medical Record Number: [id number] Patient Account Number: [id number] Date of Birth/Gender: Treating RN: [birth year] (86 y.o. [name] Primary Care Physician: Other Clinician: Referring Physician: Treating Physician/Extender: [name] in Treatment: 1 Education Assessment Education Provided To: Patient Education Topics Provided Wound/Skin Impairment: Methods: Explain/Verbal Responses: State  content correctly Electronic Signature(s) Signed: 06/03/2019 5:13:38 PM By: Sara Coria RN Entered  By: [name] on 06/03/2019 12:58:11 -------------------------------------------------------------------------------- Wound Assessment Details Patient Name: Date of Service: [place], [name] Sara A. 06/03/2019 12:45 PM Medical Record Number: [id number] Patient Account Number: [id number] Date of Birth/Sex: Treating RN: 14-May-1929 (86 y.o. [name] Primary Care Drevion Offord: Other Clinician: Referring Jordynne Mccown: Treating Yatzari Jonsson/Extender: [name] in Treatment: 1 Wound Status Wound Number: 1 Primary Soft Tissue Radionecrosis Etiology: Wound Location: Left, Lateral Lower Leg Wound Open Wounding Event: Bump Status: Date Acquired: 10/17/2018 Comorbid Cataracts, Deep Vein Thrombosis, Hypertension, Osteoarthritis, Weeks Of Treatment: 1 History: Neuropathy, Received Chemotherapy, Received Radiation, Clustered Wound: No Confinement Anxiety Wound Measurements Length: (cm) 6.8 Width: (cm) 3.6 Depth: (cm) 1 Area: (cm) 19.227 Volume: (cm) 19.227 Wound Description Classification: Full Thickness With Exposed Support Structures Wound Margin: Flat and Intact Exudate Amount: Medium Exudate Type: Serous Exudate Color: amber Foul Odor After Cleansing: No Slough/Fibrino Ye % Reduction in Area: 2.7% % Reduction in Volume: -143.3% Epithelialization: None Tunneling: No Undermining: Yes Starting Position (o'clock): 11 Ending Position (o'clock): 3 Maximum Distance: (cm) 0.3 s Wound Bed Granulation Amount: Small (1-33%) Exposed Structure Granulation Quality: Pink Fascia Exposed: No Necrotic Amount: Large (67-100%) Fat Layer (Subcutaneous Tissue) Exposed: Yes Necrotic Quality: Adherent Slough Tendon Exposed: Yes Muscle Exposed: Yes Necrosis of Muscle: No Joint Exposed: No Bone Exposed: No Treatment Notes Wound #1 (Left, Lateral Lower Leg) 1. Cleanse With Wound Cleanser 2. Periwound Care Skin Prep 3. Primary Dressing Applied [name] Other primary dressing  (specifiy in notes) 4. Secondary Dressing Roll Gauze Kerramax/Xtrasorb 5. Secured With Tape Notes saline moistened gauze over santyl. netting Electronic Signature(s) Signed: 06/03/2019 5:47:21 PM By: Kela Millin Entered By: Kela Millin on 06/03/2019 13:33:03 -------------------------------------------------------------------------------- Vitals Details Patient Name: Date of Service: [name] 06/03/2019 12:45 PM Medical Record Number: [id number] Patient Account Number: [id number] Date of Birth/Sex: Treating RN: 1929/11/21 (86 y.o. [name] Primary Care Adonnis Salceda: Other Clinician: Referring Jashawn Floyd: Treating Corene Resnick/Extender: [name] in Treatment: 1 Vital Signs Time Taken: 13:00 Temperature (F): 97.6 Height (in): 65 Pulse (bpm): 65 Weight (lbs): 138 Respiratory Rate (breaths/min): 18 Body Mass Index (BMI): 23 Blood Pressure (mmHg): 124/63 Reference Range: 80 - 120 mg / dl Electronic Signature(s) Signed: 06/03/2019 5:47:21 PM By: Kela Millin Entered By: Kela Millin on 06/03/2019 13:11:46

## 2019-06-05 NOTE — Progress Notes (Addendum)
CHEMERE, DONEY ([id number]) Visit Report for 06/03/2019 HPI Details Patient Name: Date of Service: [place], [name] 06/03/2019 12:45 PM Medical Record Number: [id number] Patient Account Number: [id number] Date of Birth/Sex: Treating RN: Jul 06, 1929 (86 y.o. [name] Primary Care Provider: Other Clinician: Referring Provider: Treating Provider/Extender: [name] in Treatment: 1 History of Present Illness HPI Description: ADMISSION 05/27/2019 This is an 86 year old woman who came here for consideration of hyperbaric oxygen. She has a very complicated history. She is been treated for diffuse large B-cell lymphoma since 2006. She has had 2 recurrences of this. Her most recent chemotherapy round was Treanda plus polatuzumab which ended I believe in February. She is not currently on chemotherapy. She came to see [place] with a large wound on the left lateral lower leg. Apparently this started as a lump sometime in October 2020. She received courses of antibiotics which did not help. She then had a biopsy and it actually showed lymphoma. The wound never really healed. And she developed a progressive ulcerated tumor. She underwent a course of radiation in late October into November 2020. This resulted in some improvement in the mass but she has been left with a deep punched out wound with exposed tendon. She was seen by Dr. Jaclynn Guarneri at the wound care center in [facility]. She considered her for operative debridement and/or consideration of hyperbaric oxygen. She was also seen in April on 05/07/2019 by Dr. Zigmund Daniel at the wound care center in [facility]. Arterial and venous reflux studies were ordered but I am unable to see these results. 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 She has been using [name] for a prolonged period of time for perhaps 5 weeks with some improvement in the wound surface. She has not had the wound rebiopsied but she did have a PET scan on 03/18/2027 that did show residual infiltrating soft tissue density involving the left lower calf but without discrete measurable mass and minimal uptake. 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 At the same time there was no new areas of metabolically active lymphoma identified. She therefore has not been on any ongoing chemotherapy. She also had a CT scan of the left lower extremity on 05/21/2019 that did not show any fracture or dislocation of the left tibia or fibula large soft tissue ulcer overlying the distal tibia no evidence of bony erosion or sclerosis to suggest osteomyelitis. 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 , The patient has had prior recent arterial studies we did not do an ABI in this clinic today. 5/18; wound looks about the same. Certainly no improvement in condition of the wound bed. She has been using [name] change daily. Deep necrotic wound on the left lateral lower calf Electronic Signature(s) Signed: 06/05/2019 12:52:55 PM By: Linton Ham MD Entered By: Linton Ham on 06/03/2019 14:00:07 -------------------------------------------------------------------------------- Physical Exam Details Patient Name: Date of Service: [name], [name] 06/03/2019 12:45 PM Medical Record Number: [id number] Patient Account Number: [id number] Date of Birth/Sex: Treating RN: [birth year] (86 y.o. [name] Primary Care Provider: Other Clinician: Referring Provider:  Treating Provider/Extender: [name] in Treatment: 1 Cardiovascular Harsh 3 out of 6 systolic  ejection murmur maximal at the aortic area radiating into the left carotid. Notes Wound exam; the patient has a large punched-out of area on the left mid calf exposed tendon. Still 100% of this covered in tightly adherent debris or peripheral pulses are palpable. Electronic Signature(s) Signed: 06/05/2019 12:52:55 PM By: Linton Ham MD Entered By: Linton Ham on 06/03/2019 14:08:59 -------------------------------------------------------------------------------- Physician Orders Details Patient Name: Date of Service: [facility], [name] 06/03/2019 12:45 PM Medical Record Number: [id number] Patient Account Number: [id number] Date of Birth/Sex: Treating RN: 04-Jul-1929 (86 y.o. [name] Primary Care Provider: Other Clinician: Referring Provider: Treating Provider/Extender: [name] in Treatment: 1 Verbal / Phone Orders: No Diagnosis Coding ICD-10 Coding Code Description [phone] Non-pressure chronic ulcer of left calf with necrosis of muscle L59.9 Disorder of the skin and subcutaneous tissue related to radiation, unspecified C83.30 Diffuse large B-cell lymphoma, unspecified site Follow-up Appointments Return Appointment in 2 weeks. Dressing Change Frequency Change dressing every day. Wound Cleansing Wound #1 Left,Lateral Lower Leg May shower and wash wound with soap and water. Primary Wound Dressing [name] Ointment Secondary Dressing Kerlix/Rolled Gauze - secure with tape Dry Gauze Hyperbaric Oxygen Therapy Indication: - soft tissue radiation necrosis If appropriate for treatment, begin HBOT per protocol: 2.0 ATA for 90 Minutes without A Breaks ir Total Number of Treatments: - 40 treatments One treatments per day (delivered Monday through Friday unless otherwise specified in Special Instructions below): Patient Medications llergies: apixaban, prednisone, hydrocodone A Notifications Medication Indication Start End [name] DOSE topical 250  unit/gram ointment - ointment topical Electronic Signature(s) Signed: 06/10/2019 7:20:14 AM By: Tobi Bastos MD, MBA Signed: 06/11/2019 7:26:18 AM By: Linton Ham MD Previous Signature: 06/03/2019 5:13:38 PM Version By: Carlene Coria RN Previous Signature: 06/05/2019 12:52:55 PM Version By: Linton Ham MD Entered By: Tobi Bastos on 06/10/2019 07:20:13 -------------------------------------------------------------------------------- Problem List Details Patient Name: Date of Service: [place], [name] 06/03/2019 12:45 PM Medical Record Number: [id number] Patient Account Number: [id number] Date of Birth/Sex: Treating RN: October 19, 1929 (86 y.o. [name] Primary Care Provider: Other Clinician: Referring Provider: Treating Provider/Extender: [name] in Treatment: 1 Active Problems ICD-10 Encounter Code Description Active Date MDM Diagnosis L97.223 Non-pressure chronic ulcer of left calf with necrosis of muscle 05/27/2019 No Yes L59.9 Disorder of the skin and subcutaneous tissue related to radiation, unspecified 05/27/2019 No Yes C83.30 Diffuse large B-cell lymphoma, unspecified site 05/27/2019 No Yes Inactive Problems Resolved Problems Electronic Signature(s) Signed: 06/05/2019 12:52:55 PM By: Linton Ham MD Entered By: Linton Ham on 06/03/2019 13:59:19 -------------------------------------------------------------------------------- Progress Note Details Patient Name: Date of Service: [name], [name] 06/03/2019 12:45 PM Medical Record Number: [id number] Patient Account Number: [id number] Date of Birth/Sex: Treating RN: 1929-12-06 (86 y.o. [name] Primary Care Provider: Other Clinician: Referring Provider: Treating Provider/Extender: [name] in Treatment: 1 Subjective History of Present Illness (HPI) ADMISSION 05/27/2019 This is an 86 year old woman who came here for consideration of hyperbaric oxygen. She has a very  complicated history. She is been treated for diffuse large B-cell lymphoma since 2006. She has had 2 recurrences of this. Her most recent chemotherapy round was Treanda plus polatuzumab which ended I believe in February. She is not currently on chemotherapy. She came to see [place] with a large wound on the left lateral lower leg. Apparently this started as a lump sometime in October 2020. She received courses of antibiotics which  did not help. She then had a biopsy and it actually showed lymphoma. The wound never really healed. And she developed a progressive ulcerated tumor. She underwent a course of radiation in late October into November 2020. This resulted in some improvement in the mass but she has been left with a deep punched out wound with exposed tendon. She was seen by Dr. Jaclynn Guarneri at the wound care center in [facility]. She considered her for operative debridement and/or consideration of hyperbaric oxygen. She was also seen in April on 05/07/2019 by Dr. Zigmund Daniel at the wound care center in [facility]. Arterial and venous reflux studies were ordered but I am unable to see these results. 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 She has been using [name] for a prolonged period of time for perhaps 5 weeks with some improvement in the wound surface. She has not had the wound rebiopsied but she did have a PET scan on 03/18/2027 that did show residual infiltrating soft tissue density involving the left lower calf but without discrete measurable mass and minimal uptake. 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 At the same time there was no new areas of metabolically active lymphoma identified. She therefore  has not been on any ongoing chemotherapy. She also had a CT scan of the left lower extremity on 05/21/2019 that did not show any fracture or dislocation of the left tibia or fibula large soft tissue ulcer overlying the distal tibia no evidence of bony erosion or sclerosis to suggest osteomyelitis. 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 , The patient has had prior recent arterial studies we did not do an ABI in this clinic today. 5/18; wound looks about the same. Certainly no improvement in condition of the wound bed. She has been using [name] change daily. Deep necrotic wound on the left lateral lower calf Objective Constitutional Vitals Time Taken: 1:00 PM, Height: 65 in, Weight: 138 lbs, BMI: 23, Temperature: 97.6 F, Pulse: 65 bpm, Respiratory Rate: 18 breaths/min, Blood Pressure: 124/63 mmHg. Cardiovascular Harsh 3 out of 6 systolic ejection murmur maximal at the aortic area radiating into the left carotid. General Notes: Wound exam; the patient has a large punched-out of area on the left mid calf exposed tendon. Still 100% of this covered in tightly adherent debris or peripheral pulses are palpable. Integumentary (Hair, Skin) Wound #1 status is Open. Original cause of wound was Bump. The wound is located on the Left,Lateral Lower Leg. The wound measures 6.8cm length x 3.6cm width x 1cm depth; 19.227cm^2 area and 19.227cm^3 volume. There is muscle, tendon, and Fat Layer (Subcutaneous Tissue) Exposed exposed. There is no tunneling noted, however, there is undermining starting at 11:00 and ending at 3:00 with a maximum distance of 0.3cm. There is a medium amount of serous drainage noted. The wound margin is flat and intact. There is small (1-33%) pink granulation within the wound bed. There is a large (67-100%) amount of necrotic tissue within the wound bed including Adherent  Slough. Assessment Active Problems ICD-10 Non-pressure chronic ulcer of left calf with necrosis of muscle Disorder of the skin  and subcutaneous tissue related to radiation, unspecified Diffuse large B-cell lymphoma, unspecified site Plan Follow-up Appointments: Return Appointment in 2 weeks. Dressing Change Frequency: Change dressing every day. Wound Cleansing: Wound #1 Left,Lateral Lower Leg: May shower and wash wound with soap and water. Primary Wound Dressing: [name] Ointment Secondary Dressing: Kerlix/Rolled Gauze - secure with tape Dry Gauze Hyperbaric Oxygen Therapy: Indication: - soft tissue radiation necrosis If appropriate for treatment, begin HBOT per protocol: 2.0 ATA for 90 Minutes without Air Breaks T Number of Treatments: - 40 treatments otal One treatments per day (delivered Monday through Friday unless otherwise specified in Special Instructions below): Antihistamine 30 minutes prior to HBO Treatment, difficulty clearing ears. 1. I spoke to Dr. Gwenlyn Found this morning 2. Looking for cardiac clearance for hyperbarics. I suspect she will need a follow-up echocardiogram 3. I will continue with [name] on the wound. I would like to have probably 2 weeks of hyperbarics before I consider any debridement of this irradiated area Electronic Signature(s) Signed: 06/05/2019 12:52:55 PM By: Linton Ham MD Entered By: Linton Ham on 06/03/2019 14:10:00 -------------------------------------------------------------------------------- SuperBill Details Patient Name: Date of Service: [name], [name] 06/03/2019 Medical Record Number: [id number] Patient Account Number: [id number] Date of Birth/Sex: Treating RN: 10-29-1929 (86 y.o. [name] Primary Care Provider: Other Clinician: Referring Provider: Treating Provider/Extender: [name] in Treatment: 1 Diagnosis Coding ICD-10 Codes Code Description [phone] Non-pressure chronic ulcer of left calf  with necrosis of muscle L59.9 Disorder of the skin and subcutaneous tissue related to radiation, unspecified C83.30 Diffuse large B-cell lymphoma, unspecified site Physician Procedures : CPT4 Code Description Modifier [id number] - WC PHYS LEVEL 3 - EST PT ICD-10 Diagnosis Description L97.223 Non-pressure chronic ulcer of left calf with necrosis of muscle L59.9 Disorder of the skin and subcutaneous tissue related to radiation,  unspecified C83.30 Diffuse large B-cell lymphoma, unspecified site Quantity: 1 Electronic Signature(s) Signed: 06/05/2019 12:52:55 PM By: Linton Ham MD Entered By: Linton Ham on 06/03/2019 14:10:38

## 2019-06-06 ENCOUNTER — Ambulatory Visit (HOSPITAL_COMMUNITY)
Admission: RE | Admit: 2019-06-06 | Discharge: 2019-06-06 | Disposition: A | Discharge status: Home / Self Care | Payer: Medicare Other | Source: Ambulatory Visit | Attending: Internal Medicine | Admitting: Internal Medicine

## 2019-06-06 ENCOUNTER — Other Ambulatory Visit: Payer: Self-pay

## 2019-06-06 DIAGNOSIS — R0989 Other specified symptoms and signs involving the circulatory and respiratory systems: Secondary | ICD-10-CM | POA: Diagnosis not present

## 2019-06-10 ENCOUNTER — Ambulatory Visit (HOSPITAL_COMMUNITY)
Admission: RE | Admit: 2019-06-10 | Discharge: 2019-06-10 | Disposition: A | Discharge status: Home / Self Care | Payer: Medicare Other | Source: Ambulatory Visit | Attending: Cardiovascular Disease | Admitting: Cardiovascular Disease

## 2019-06-10 ENCOUNTER — Other Ambulatory Visit: Payer: Self-pay

## 2019-06-10 DIAGNOSIS — I082 Rheumatic disorders of both aortic and tricuspid valves: Secondary | ICD-10-CM | POA: Diagnosis present

## 2019-06-10 DIAGNOSIS — R011 Cardiac murmur, unspecified: Secondary | ICD-10-CM | POA: Diagnosis not present

## 2019-06-10 NOTE — Progress Notes (Signed)
  Echocardiogram 2D Echocardiogram has been performed.  [name] 06/10/2019, 4:15 PM

## 2019-06-13 ENCOUNTER — Telehealth: Payer: Self-pay

## 2019-06-13 DIAGNOSIS — R011 Cardiac murmur, unspecified: Secondary | ICD-10-CM

## 2019-06-13 DIAGNOSIS — I350 Nonrheumatic aortic (valve) stenosis: Secondary | ICD-10-CM

## 2019-06-13 NOTE — Telephone Encounter (Signed)
Spoke to patient Dr.Berry's advice given. 

## 2019-06-13 NOTE — Telephone Encounter (Signed)
Spoke to patient echo results given.She wanted to ask Dr.Berry if ok for her to have hyperbaric O2 treatment at wound center.Message sent to [facility] for advice.

## 2019-06-13 NOTE — Telephone Encounter (Signed)
Mild to mod AS. OK to proceed with hyperbaric O2

## 2019-06-17 ENCOUNTER — Encounter (HOSPITAL_BASED_OUTPATIENT_CLINIC_OR_DEPARTMENT_OTHER): Payer: Medicare Other | Attending: Internal Medicine | Admitting: Internal Medicine

## 2019-06-17 DIAGNOSIS — Y842 Radiological procedure and radiotherapy as the cause of abnormal reaction of the patient, or of later complication, without mention of misadventure at the time of the procedure: Secondary | ICD-10-CM | POA: Diagnosis not present

## 2019-06-17 DIAGNOSIS — C8330 Diffuse large B-cell lymphoma, unspecified site: Secondary | ICD-10-CM | POA: Diagnosis not present

## 2019-06-17 DIAGNOSIS — L598 Other specified disorders of the skin and subcutaneous tissue related to radiation: Secondary | ICD-10-CM | POA: Insufficient documentation

## 2019-06-17 DIAGNOSIS — L97223 Non-pressure chronic ulcer of left calf with necrosis of muscle: Secondary | ICD-10-CM | POA: Insufficient documentation

## 2019-06-18 NOTE — Progress Notes (Signed)
LADINA, WALMSLEY ([id number]) Visit Report for 06/17/2019 Arrival Information Details Patient Name: Date of Service: [place], [name] 06/17/2019 2:15 PM Medical Record Number: [id number] Patient Account Number: [id number] Date of Birth/Sex: Treating RN: 1929/04/06 (86 y.o. [name], [name] Primary Care Kayler Buckholtz: PA [name], [name] Other Clinician: Referring Armelia Penton: Treating Orma Cheetham/Extender: [name] in Treatment: 3 Visit Information History Since Last Visit Added or deleted any medications: No Patient Arrived: Cane Any new allergies or adverse reactions: No Arrival Time: 14:26 Had a fall or experienced change in No Accompanied By: self activities of daily living that may affect Transfer Assistance: None risk of falls: Patient Identification Verified: Yes Signs or symptoms of abuse/neglect since last visito No Secondary Verification Process Completed: Yes Hospitalized since last visit: No Patient Requires Transmission-Based Precautions: No Implantable device outside of the clinic excluding No Patient Has Alerts: No cellular tissue based products placed in the center since last visit: Has Dressing in Place as Prescribed: Yes Pain Present Now: Yes Electronic Signature(s) Signed: 06/17/2019 5:54:40 PM By: Baruch Gouty RN, BSN Entered By: Baruch Gouty on 06/17/2019 14:28:53 -------------------------------------------------------------------------------- Encounter Discharge Information Details Patient Name: Date of Service: [name], [name] 06/17/2019 2:15 PM Medical Record Number: [id number] Patient Account Number: [id number] Date of Birth/Sex: Treating RN: [birth year] (86 y.o. [name] Primary Care Daizy Outen: PA [name], [name] Other Clinician: Referring Wenda Vanschaick: Treating Racine Erby/Extender: [name] in Treatment: 3 Encounter Discharge Information Items Post Procedure Vitals Discharge Condition: Stable Temperature (F):  97.8 Ambulatory Status: Cane Pulse (bpm): 74 Discharge Destination: Home Respiratory Rate (breaths/min): 18 Transportation: Private Auto Blood Pressure (mmHg): 128/70 Accompanied By: self Schedule Follow-up Appointment: Yes Clinical Summary of Care: Patient Declined Electronic Signature(s) Signed: 06/18/2019 7:13:26 AM By: Kela Millin Entered By: Kela Millin on 06/17/2019 15:50:22 -------------------------------------------------------------------------------- Lower Extremity Assessment Details Patient Name: Date of Service: [place], [name] 06/17/2019 2:15 PM Medical Record Number: [id number] Patient Account Number: [id number] Date of Birth/Sex: Treating RN: 1929-11-02 (86 y.o. [name] Primary Care Nereyda Bowler: PA [name], [name] Other Clinician: Referring Loman Logan: Treating Carlas Vandyne/Extender: [name] in Treatment: 3 Edema Assessment Assessed: [Left: No] [Right: No] Edema: [Left: Ye] [Right: s] Calf Left: Right: Point of Measurement: cm From Medial Instep 34.5 cm cm Ankle Left: Right: Point of Measurement: cm From Medial Instep 20.5 cm cm Vascular Assessment Pulses: Dorsalis Pedis Palpable: [Left:Yes] Electronic Signature(s) Signed: 06/17/2019 5:54:40 PM By: Baruch Gouty RN, BSN Entered By: Baruch Gouty on 06/17/2019 14:31:40 -------------------------------------------------------------------------------- Multi-Disciplinary Care Plan Details Patient Name: Date of Service: [place], [name] 06/17/2019 2:15 PM Medical Record Number: [id number] Patient Account Number: [id number] Date of Birth/Sex: Treating RN: 1929-11-28 (86 y.o. [name] Primary Care Aaiden Depoy: PA [name], [name] Other Clinician: Referring Ilya Neely: Treating Rainn Zupko/Extender: [name] in Treatment: 3 Active Inactive HBO Nursing Diagnoses: Anxiety related to feelings of confinement associated with the hyperbaric oxygen chamber Anxiety related to  knowledge deficit of hyperbaric oxygen therapy and treatment procedures Goals: Patient and/or family will be able to state/discuss factors appropriate to the management of their disease process during treatment Date Initiated: 05/27/2019 Target Resolution Date: 06/27/2019 Goal Status: Active Patient will tolerate the hyperbaric oxygen therapy treatment Date Initiated: 05/27/2019 Target Resolution Date: 06/27/2019 Goal Status: Active Patient will tolerate the internal climate of the chamber Date Initiated: 05/27/2019 Target Resolution Date: 06/27/2019 Goal Status: Active Patient/caregiver will verbalize understanding of HBO goals, rationale, procedures and potential hazards Date Initiated: 05/27/2019 Target Resolution Date: 06/27/2019 Goal Status: Active  Interventions: Administer a five (5) minute air break for patient if signs and symptoms of seizure appear and notify the hyperbaric physician Administer decongestants, per physician orders, prior to HBO2 Administer the correct therapeutic gas delivery based on the patients needs and limitations, per physician order Assess and provide for patients comfort related to the hyperbaric environment and equalization of middle ear Assess for signs and symptoms related to adverse events, including but not limited to confinement anxiety, pneumothorax, oxygen toxicity and baurotrauma Assess patient's knowledge and expectations regarding hyperbaric medicine and provide education related to the hyperbaric environment, goals of treatment and prevention of adverse events Notes: Wound/Skin Impairment Nursing Diagnoses: Knowledge deficit related to ulceration/compromised skin integrity Goals: Patient/caregiver will verbalize understanding of skin care regimen Date Initiated: 05/27/2019 Target Resolution Date: 06/27/2019 Goal Status: Active Ulcer/skin breakdown will have a volume reduction of 30% by week 4 Date Initiated: 05/27/2019 Target Resolution Date:  06/27/2019 Goal Status: Active Interventions: Assess patient/caregiver ability to obtain necessary supplies Assess patient/caregiver ability to perform ulcer/skin care regimen upon admission and as needed Assess ulceration(s) every visit Notes: Electronic Signature(s) Signed: 06/18/2019 4:36:43 PM By: Carlene Coria RN Entered By: Carlene Coria on 06/17/2019 14:35:23 -------------------------------------------------------------------------------- Pain Assessment Details Patient Name: Date of Service: [place], [name] 06/17/2019 2:15 PM Medical Record Number: [id number] Patient Account Number: [id number] Date of Birth/Sex: Treating RN: 10-05-1929 (86 y.o. [name] Primary Care Quentavious Rittenhouse: PA [name], [name] Other Clinician: Referring Sumeya Yontz: Treating Kohan Azizi/Extender: [name] in Treatment: 3 Active Problems Location of Pain Severity and Description of Pain Patient Has Paino Yes Site Locations Pain Location: Pain Location: Pain in Ulcers With Dressing Change: Yes Duration of the Pain. Constant / Intermittento Intermittent Rate the pain. Current Pain Level: 1 Worst Pain Level: 10 Least Pain Level: 0 Character of Pain Describe the Pain: Aching, Sharp, Shooting Pain Management and Medication Current Pain Management: Medication: Yes Is the Current Pain Management Adequate: Adequate Activity: Yes How does your wound impact your activities of daily livingo Sleep: Yes Bathing: No Appetite: No Relationship With Others: No Bladder Continence: No Emotions: No Bowel Continence: No Work: No Toileting: No Drive: No Dressing: No Hobbies: No Electronic Signature(s) Signed: 06/17/2019 5:54:40 PM By: Baruch Gouty RN, BSN Entered By: Baruch Gouty on 06/17/2019 14:30:49 -------------------------------------------------------------------------------- Patient/Caregiver Education Details Patient Name: Date of Service: [name], [name]  6/1/2021andnbsp2:15 PM Medical Record Number: [id number] Patient Account Number: [id number] Date of Birth/Gender: Treating RN: 12/26/1929 (86 y.o. [name] Primary Care Physician: PA [name], [name] Other Clinician: Referring Physician: Treating Physician/Extender: [name] in Treatment: 3 Education Assessment Education Provided To: Patient Education Topics Provided Wound/Skin Impairment: Methods: Explain/Verbal Responses: State content correctly Electronic Signature(s) Signed: 06/18/2019 4:36:43 PM By: Carlene Coria RN Entered By: Carlene Coria on 06/17/2019 14:35:46 -------------------------------------------------------------------------------- Wound Assessment Details Patient Name: Date of Service: [place], [name] 06/17/2019 2:15 PM Medical Record Number: [id number] Patient Account Number: [id number] Date of Birth/Sex: Treating RN: [birth year] (86 y.o. [name] Primary Care Uzma Hellmer: PA [name], [name] Other Clinician: Referring Allexus Ovens: Treating Nymir Ringler/Extender: [name] in Treatment: 3 Wound Status Wound Number: 1 Primary Soft Tissue Radionecrosis Etiology: Wound Location: Left, Lateral Lower Leg Wound Open Wounding Event: Bump Status: Date Acquired: 10/17/2018 Comorbid Cataracts, Deep Vein Thrombosis, Hypertension, Osteoarthritis, Weeks Of Treatment: 3 History: Neuropathy, Received Chemotherapy, Received Radiation, Clustered Wound: No Confinement Anxiety Photos Photo Uploaded By: Mikeal Hawthorne on 06/18/2019 10:31:27 Wound Measurements Length: (cm) 7.1 Width: (cm) 4 Depth: (cm) 0.8  Area: (cm) 22.305 Volume: (cm) 17.844 % Reduction in Area: -12.9% % Reduction in Volume: -125.8% Epithelialization: None Tunneling: No Undermining: No Wound Description Classification: Full Thickness With Exposed Support Struct Wound Margin: Distinct, outline attached Exudate Amount: Medium Exudate Type: Purulent Exudate Color: yellow,  brown, green ures Foul Odor After Cleansing: No Slough/Fibrino Yes Wound Bed Granulation Amount: Small (1-33%) Exposed Structure Granulation Quality: Pink Fascia Exposed: No Necrotic Amount: Large (67-100%) Fat Layer (Subcutaneous Tissue) Exposed: Yes Necrotic Quality: Adherent Slough Tendon Exposed: Yes Muscle Exposed: Yes Necrosis of Muscle: No Joint Exposed: No Bone Exposed: No Treatment Notes Wound #1 (Left, Lateral Lower Leg) 1. Cleanse With Wound Cleanser 2. Periwound Care Skin Prep 3. Primary Dressing Applied [name] 4. Secondary Dressing Dry Gauze Roll Gauze 5. Secured With Tape Notes saline moistened gauze over santyl. netting Electronic Signature(s) Signed: 06/17/2019 5:54:40 PM By: Baruch Gouty RN, BSN Entered By: Baruch Gouty on 06/17/2019 14:33:52 -------------------------------------------------------------------------------- Vitals Details Patient Name: Date of Service: [name], [name] 06/17/2019 2:15 PM Medical Record Number: [id number] Patient Account Number: [id number] Date of Birth/Sex: Treating RN: November 21, 1929 (86 y.o. [name] Primary Care Sunshine Mackowski: PA [name], [name] Other Clinician: Referring Paislei Dorval: Treating Emran Molzahn/Extender: [name] in Treatment: 3 Vital Signs Time Taken: 14:28 Temperature (F): 97.8 Height (in): 65 Pulse (bpm): 74 Source: Stated Respiratory Rate (breaths/min): 18 Weight (lbs): 138 Blood Pressure (mmHg): 128/70 Source: Stated Reference Range: 80 - 120 mg / dl Body Mass Index (BMI): 23 Electronic Signature(s) Signed: 06/17/2019 5:54:40 PM By: Baruch Gouty RN, BSN Entered By: Baruch Gouty on 06/17/2019 14:29:36

## 2019-06-18 NOTE — Progress Notes (Addendum)
[name], [name] ([id number]) Visit Report for 06/17/2019 Debridement Details Patient Name: Date of Service: [place], [name] 06/17/2019 2:15 PM Medical Record Number: [id number] Patient Account Number: [id number] Date of Birth/Sex: Treating RN: [birth year] (86 y.o. [name] Primary Care Provider: PA [name], [name] Other Clinician: Referring Provider: Treating Provider/Extender: [name] in Treatment: 3 Debridement Performed for Assessment: Wound #1 Left,Lateral Lower Leg Performed By: Physician Ricard Dillon., MD Debridement Type: Chemical/Enzymatic/Mechanical Agent Used: [name] Level of Consciousness (Pre-procedure): Awake and Alert Pre-procedure Verification/Time Out Yes - 15:40 Taken: Start Time: 15:40 Pain Control: Lidocaine 5% topical ointment Bleeding: None End Time: 15:42 Procedural Pain: 0 Post Procedural Pain: 0 Response to Treatment: Procedure was tolerated well Level of Consciousness (Post- Awake and Alert procedure): Post Debridement Measurements of Total Wound Length: (cm) 7.1 Width: (cm) 4 Depth: (cm) 0.8 Volume: (cm) 17.844 Character of Wound/Ulcer Post Debridement: Improved Post Procedure Diagnosis Same as Pre-procedure Electronic Signature(s) Signed: 06/17/2019 5:52:05 PM By: Linton Ham MD Signed: 06/18/2019 4:36:43 PM By: Carlene Coria RN Entered By: Carlene Coria on 06/17/2019 15:47:22 -------------------------------------------------------------------------------- HPI Details Patient Name: Date of Service: Sara Benne, [name] 06/17/2019 2:15 PM Medical Record Number: [id number] Patient Account Number: [id number] Date of Birth/Sex: Treating RN: 1929/05/13 (86 y.o. [name] Primary Care Provider: PA [name], [name] Other Clinician: Referring Provider: Treating Provider/Extender: [name] in Treatment: 3 History of Present Illness HPI Description: ADMISSION 05/27/2019 This is an 86 year old woman who came here  for consideration of hyperbaric oxygen. She has a very complicated history. She is been treated for diffuse large B-cell lymphoma since 2006. She has had 2 recurrences of this. Her most recent chemotherapy round was Treanda plus polatuzumab which ended I believe in February. She is not currently on chemotherapy. She came to see [place] with a large wound on the left lateral lower leg. Apparently this started as a lump sometime in October 2020. She received courses of antibiotics which did not help. She then had a biopsy and it actually showed lymphoma. The wound never really healed. And she developed a progressive ulcerated tumor. She underwent a course of radiation in late October into November 2020. This resulted in some improvement in the mass but she has been left with a deep punched out wound with exposed tendon. She was seen by Dr. Jaclynn Guarneri at the wound care center in [facility]. She considered her for operative debridement and/or consideration of hyperbaric oxygen. She was also seen in April on 05/07/2019 by Dr. Zigmund Daniel at the wound care center in [facility]. Arterial and venous reflux studies were ordered but I am unable to see these results. 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 She has been using [name] for a prolonged period of time for perhaps 5 weeks with some improvement in the wound surface. She has not had the wound rebiopsied but she did have a PET scan on 03/18/2027 that did show residual infiltrating soft tissue density involving the left lower calf but without discrete measurable mass and minimal uptake. 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 At the same time there was no new areas of  metabolically active lymphoma identified. She therefore has not been on  any ongoing chemotherapy. She also had a CT scan of the left lower extremity on 05/21/2019 that did not show any fracture or dislocation of the left tibia or fibula large soft tissue ulcer overlying the distal tibia no evidence of bony erosion or sclerosis to suggest osteomyelitis. 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 , The patient has had prior recent arterial studies we did not do an ABI in this clinic today. 5/18; wound looks about the same. Certainly no improvement in condition of the wound bed. She has been using [name] change daily. Deep necrotic wound on the left lateral lower calf 6/1; absolutely no change in this wound. Completely nonviable surface with tendon in the middle. She ran out of [name] last week in my absence and has been using what sounds like Vache solution. I will refax the [name] to [facility] I have her echocardiogram report. Her valve area is 1.25 mild to moderate AS. I think this is slightly worse than 3 years ago but this should not preclude her treatment with hyperbarics. I managed to creep in Dr. Kennon Holter interaction with his own nurse with regards to this question and I think he is approved that. I will text him just to make sure Electronic Signature(s) Signed: 06/17/2019 5:52:05 PM By: Linton Ham MD Entered By: Linton Ham on 06/17/2019 15:52:41 -------------------------------------------------------------------------------- Physical Exam Details Patient Name: Date of Service: Sara RER, [name] 06/17/2019 2:15 PM Medical Record Number: [id number] Patient Account Number: [id number] Date of Birth/Sex: Treating RN: Nov 04, 1929 (86 y.o. [name] Primary Care Provider: PA [name], [name] Other Clinician: Referring Provider: Treating Provider/Extender: [name] in Treatment: 3 Notes Wound exam; large punched-out area in the left mid calf with exposed tendon. 100% covered in a tightly adherent debris. She does not even tolerate wound cleanser with gauze. There is no evidence of surrounding infection her peripheral pulses are palpable Electronic Signature(s) Signed: 06/17/2019 5:52:05 PM By: Linton Ham MD Entered By: Linton Ham on 06/17/2019 15:53:23 -------------------------------------------------------------------------------- Physician Orders Details Patient Name: Date of Service: Sara RER, [name] 06/17/2019 2:15 PM Medical Record Number: [id number] Patient Account Number: [id number] Date of Birth/Sex: Treating RN: [birth year] (86 y.o. [name] Primary Care Provider: PA TIENT, [name] Other Clinician: Referring Provider: Treating Provider/Extender: [name] in Treatment: 3 Verbal / Phone Orders: No Diagnosis Coding ICD-10 Coding Code Description [phone] Non-pressure chronic ulcer of left calf with necrosis of muscle L59.9 Disorder of the skin and subcutaneous tissue related to radiation, unspecified C83.30 Diffuse large B-cell lymphoma, unspecified site Follow-up Appointments Return Appointment in 2 weeks. Dressing Change Frequency Change dressing every day. Wound Cleansing Wound #1 Left,Lateral Lower Leg May shower and wash wound with soap and water. Primary Wound Dressing [name] Ointment Secondary Dressing Kerlix/Rolled Gauze - secure with tape Dry Gauze Hyperbaric Oxygen Therapy Indication: - soft tissue radiation necrosis If appropriate for treatment, begin HBOT per protocol: 2.0 ATA for 90 Minutes without A Breaks ir Total Number of Treatments: - 40 treatments One treatments per day (delivered Monday through Friday unless otherwise specified in Special Instructions below): Patient Medications llergies: apixaban, prednisone, hydrocodone A Notifications Medication Indication Start  End 06/17/2019 [name] DOSE topical 250 unit/gram ointment - ointment topical to wound left leg change daily (7.1x4.0x0.8). 90 grams (3x30g) Electronic Signature(s) Signed: 06/17/2019 3:49:37 PM By: Linton Ham MD Entered By: Linton Ham on 06/17/2019 15:49:36 --------------------------------------------------------------------------------  Problem List Details Patient Name: Date of Service: [place], [name] 06/17/2019 2:15 PM Medical Record Number: [id number] Patient Account Number: [id number] Date of Birth/Sex: Treating RN: 1929-08-17 (86 y.o. [name] Primary Care Provider: PA [name], [name] Other Clinician: Referring Provider: Treating Provider/Extender: [name] in Treatment: 3 Active Problems ICD-10 Encounter Code Description Active Date MDM Diagnosis L97.223 Non-pressure chronic ulcer of left calf with necrosis of muscle 05/27/2019 No Yes L59.9 Disorder of the skin and subcutaneous tissue related to radiation, unspecified 05/27/2019 No Yes C83.30 Diffuse large B-cell lymphoma, unspecified site 05/27/2019 No Yes Inactive Problems Resolved Problems Electronic Signature(s) Signed: 06/17/2019 5:52:05 PM By: Linton Ham MD Signed: 06/18/2019 4:36:43 PM By: Carlene Coria RN Entered By: Carlene Coria on 06/17/2019 14:34:42 -------------------------------------------------------------------------------- Progress Note Details Patient Name: Date of Service: Sara Benne, [name] 06/17/2019 2:15 PM Medical Record Number: [id number] Patient Account Number: [id number] Date of Birth/Sex: Treating RN: Oct 29, 1929 (86 y.o. [name] Primary Care Provider: PA [name], [name] Other Clinician: Referring Provider: Treating Provider/Extender: [name] in Treatment: 3 Subjective History of Present Illness (HPI) ADMISSION 05/27/2019 This is an 85 year old woman who came here for consideration of hyperbaric oxygen. She has a very complicated history. She is been  treated for diffuse large B-cell lymphoma since 2006. She has had 2 recurrences of this. Her most recent chemotherapy round was Treanda plus polatuzumab which ended I believe in February. She is not currently on chemotherapy. She came to see [place] with a large wound on the left lateral lower leg. Apparently this started as a lump sometime in October 2020. She received courses of antibiotics which did not help. She then had a biopsy and it actually showed lymphoma. The wound never really healed. And she developed a progressive ulcerated tumor. She underwent a course of radiation in late October into November 2020. This resulted in some improvement in the mass but she has been left with a deep punched out wound with exposed tendon. She was seen by Dr. Jaclynn Guarneri at the wound care center in [facility]. She considered her for operative debridement and/or consideration of hyperbaric oxygen. She was also seen in April on 05/07/2019 by Dr. Zigmund Daniel at the wound care center in [facility]. Arterial and venous reflux studies were ordered but I am unable to see these results. I think preparations were being made for hyperbaric oxygen therapy but the patient lives in [place] and so she arrives here for consideration of hyperbaric oxygen for soft tissue radiation injury Other factors in this is that she does not tolerate compression because of pain. She has been using [name] for a prolonged period of time for perhaps 5 weeks with some improvement in the wound surface. She has not had the wound rebiopsied but she did have a PET scan on 03/18/2027 that did show residual infiltrating soft tissue density involving the left lower calf but without discrete measurable mass and minimal uptake. 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 At the same time there was no new areas of metabolically active lymphoma identified. She therefore has not been on any ongoing  chemotherapy. She also had a CT scan of the left lower extremity on 05/21/2019 that did not show any fracture or dislocation of the left tibia or fibula large soft tissue ulcer overlying the distal tibia no evidence of bony erosion or sclerosis to suggest osteomyelitis. Consider MRI Past medical history includes diffuse  large cell B lymphoma as described, history of a DVT also a history of livedoid vasculopathy many years ago. , The patient has had prior recent arterial studies we did not do an ABI in this clinic today. 5/18; wound looks about the same. Certainly no improvement in condition of the wound bed. She has been using [name] change daily. Deep necrotic wound on the left lateral lower calf 6/1; absolutely no change in this wound. Completely nonviable surface with tendon in the middle. She ran out of [name] last week in my absence and has been using what sounds like Vache solution. I will refax the [name] to [facility] I have her echocardiogram report. Her valve area is 1.25 mild to moderate AS. I think this is slightly worse than 3 years ago but this should not preclude her treatment with hyperbarics. I managed to creep in Dr. Kennon Holter interaction with his own nurse with regards to this question and I think he is approved that. I will text him just to make sure Objective Constitutional Vitals Time Taken: 2:28 PM, Height: 65 in, Source: Stated, Weight: 138 lbs, Source: Stated, BMI: 23, Temperature: 97.8 F, Pulse: 74 bpm, Respiratory Rate: 18 breaths/min, Blood Pressure: 128/70 mmHg. Integumentary (Hair, Skin) Wound #1 status is Open. Original cause of wound was Bump. The wound is located on the Left,Lateral Lower Leg. The wound measures 7.1cm length x 4cm width x 0.8cm depth; 22.305cm^2 area and 17.844cm^3 volume. There is muscle, tendon, and Fat Layer (Subcutaneous Tissue) Exposed exposed. There is no tunneling or undermining noted. There is a medium amount of purulent drainage noted. The  wound margin is distinct with the outline attached to the wound base. There is small (1-33%) pink granulation within the wound bed. There is a large (67-100%) amount of necrotic tissue within the wound bed including Adherent Slough. Assessment Active Problems ICD-10 Non-pressure chronic ulcer of left calf with necrosis of muscle Disorder of the skin and subcutaneous tissue related to radiation, unspecified Diffuse large B-cell lymphoma, unspecified site HBO Evaluation Hx of prior radiation The patient had radiation at 62 GY for a total of 20 fractions in October to November 2020 for lymphoma of the left lower extremity. Location of STRN Left lower leg Description of symptoms Large, painful nonhealing wound on the left lateral lower leg. CT scan results Showed no fracture or dislocation of the left tibia or fibula. Large soft tissue ulceration overlying the distal left fibular metadiaphysis. No evidence of bony erosion to suggest osteomyelitis X-Ray results No fracture or dislocation PET scan PET scan showed excellent and complete metabolic response. Resolution of the lower extremity lesions. No measurable mass Other tests Arterial studies did not suggest a significant component of arterial insufficiency contributing to the nonhealing status of the wound. Plan of care/Summary I think the patient is a good candidate for hyperbaric oxygen. The preponderance of evidence suggests that this wound is secondary to radiation initially for biopsy-proven lymphoma. Follow-up evaluation does not suggest any ongoing malignancy in this area. We are going to use 2 ata for 90-minute per day 40 treatments. She will continue to follow actively with medical oncology. At some point she is probably going to require an operative debridement perhaps with a advanced tissue product at that time however I would not go ahead with this until we have had a chance to improve her underlying tissue with  hyperbaric oxygen. 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 There was a Chemical/Enzymatic/Mechanical debridement performed by Ricard Dillon., MD. after achieving pain control using Lidocaine 5% topical ointment. Agent used was [employer]. A time out was conducted at 15:40, prior to the start of the procedure. There was no bleeding. The procedure was tolerated well with a pain level of 0 throughout and a pain level of 0 following the procedure. Post Debridement Measurements: 7.1cm length x 4cm width x 0.8cm depth; 17.844cm^3 volume. Character of Wound/Ulcer Post Debridement is improved. Post procedure Diagnosis Wound #1: Same as Pre-Procedure Plan Follow-up Appointments: Return Appointment in 2 weeks. Dressing Change Frequency: Change dressing every day. Wound Cleansing: Wound #1 Left,Lateral Lower Leg: May shower and wash wound with soap and water. Primary Wound Dressing: [name] Ointment Secondary Dressing: Kerlix/Rolled Gauze - secure with tape Dry Gauze Hyperbaric Oxygen Therapy: Indication: - soft tissue radiation necrosis If appropriate for treatment, begin HBOT per protocol: 2.0 ATA for 90 Minutes without Air Breaks T Number of Treatments: - 40 treatments otal One treatments per day (delivered Monday through Friday unless otherwise specified in Special Instructions below): The following medication(s) was prescribed: [name] topical 250 unit/gram ointment ointment topical to wound left leg change daily (7.1x4.0x0.8). 90 grams (3x30g) starting 06/17/2019 1. We have approval for hyperbarics from cardiology 2. We will put the orders through now 3. We will need to put this through our preauthorization review 4. I do not know that  she is going to tolerate mechanical debridement on this. May need to consider a plastic surgery referral at some point but no matter who does operative intervention on this likely going to require at least several weeks of HBO Addendum 06/19/2019: 1)I have been in communication with Dr. Gwenlyn Found of cardiology. She was felt to have mild to moderate aortic stenosis with a valve area of 1.25 cm. There are no concerns with regards to initiation of HBO. 2.) I reviewed the patient's arterial studies from 05/14/2019 on the left side this showed an ABI of 1.18 with triphasic waveforms. Slightly reduced TBI at 0.59 however I do not think there is any evidence of arterial insufficiency should preclude healing of the wound on the left lateral lower leg 3.) Although the patient does have venous reflux she does not have acute DVT. The overwhelming consensus opinion on this is that this represents soft tissue radionecrosis. Imaging studies do not suggest underlying osteomyelitis. PET scan did not suggest active lymphoma. She has slight improvement with conventional dressings including most recently [name]. As such she is an appropriate candidate for hyperbaric oxygen. Given her cardiac status will use 2 atm without air breaks for 90 minutes x 40 treatments. 4. At some point this patient is going to need surgical debridement of this wound this cannot be done in this clinic, nor any outpatient area. She will need an operative debridement perhaps with placement of ACell or Integra. However I would only do this after we have had overall tissue improvement with hyperbaric oxygen. We will need to involve plastic surgery at some point but I think that will probably be in about a month. In the meantime she continues to follow actively with hematology and oncology. Electronic Signature(s) Signed: 06/20/2019 3:17:58 PM By: Linton Ham MD Previous Signature: 06/20/2019 1:17:24 PM Version By: Linton Ham MD Previous  Signature: 06/19/2019 7:31:08 AM Version By: Linton Ham MD Previous Signature: 06/17/2019  5:52:05 PM Version By: Linton Ham MD Entered By: Linton Ham on 06/20/2019 15:17:58 -------------------------------------------------------------------------------- SuperBill Details Patient Name: Date of Service: Sara Benne, [name] 06/17/2019 Medical Record Number: [id number] Patient Account Number: [id number] Date of Birth/Sex: Treating RN: 04-Dec-1929 (86 y.o. [name] Primary Care Provider: PA [name], [name] Other Clinician: Referring Provider: Treating Provider/Extender: [name] in Treatment: 3 Diagnosis Coding ICD-10 Codes Code Description [phone] Non-pressure chronic ulcer of left calf with necrosis of muscle L59.9 Disorder of the skin and subcutaneous tissue related to radiation, unspecified C83.30 Diffuse large B-cell lymphoma, unspecified site Facility Procedures CPT4 Code: [id number] Description: [phone] - DEBRIDE W/O ANES NON SELECT Modifier: Quantity: 1 Physician Procedures : CPT4 Code Description Modifier [id number] - WC PHYS LEVEL 4 - EST PT ICD-10 Diagnosis Description L97.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

## 2019-07-01 ENCOUNTER — Encounter (HOSPITAL_BASED_OUTPATIENT_CLINIC_OR_DEPARTMENT_OTHER): Payer: Medicare Other | Admitting: Internal Medicine

## 2019-07-01 ENCOUNTER — Other Ambulatory Visit: Payer: Self-pay

## 2019-07-01 DIAGNOSIS — L598 Other specified disorders of the skin and subcutaneous tissue related to radiation: Secondary | ICD-10-CM | POA: Diagnosis not present

## 2019-07-02 ENCOUNTER — Encounter (HOSPITAL_BASED_OUTPATIENT_CLINIC_OR_DEPARTMENT_OTHER): Payer: Medicare Other | Admitting: Physician Assistant

## 2019-07-02 ENCOUNTER — Other Ambulatory Visit: Payer: Self-pay

## 2019-07-02 DIAGNOSIS — L598 Other specified disorders of the skin and subcutaneous tissue related to radiation: Secondary | ICD-10-CM | POA: Diagnosis not present

## 2019-07-02 NOTE — Progress Notes (Signed)
Sara, [name] ([id number]) Visit Report for 07/01/2019 HPI Details Patient Name: Date of Service: [name], [name] 07/01/2019 3:00 PM Medical Record Number: [id number] Patient Account Number: [id number] Date of Birth/Sex: Treating RN: [birth year] (86 y.o. [name] Primary Care Provider: PA [name], [name] Other Clinician: Referring Provider: Treating Provider/Extender: [name] in Treatment: 5 History of Present Illness HPI Description: ADMISSION 05/27/2019 This is an 86 year old woman who came here for consideration of hyperbaric oxygen. She has a very complicated history. She is been treated for diffuse large B-cell lymphoma since 2006. She has had 2 recurrences of this. Her most recent chemotherapy round was Treanda plus polatuzumab which ended I believe in February. She is not currently on chemotherapy. She came to see [place] with a large wound on the left lateral lower leg. Apparently this started as a lump sometime in October 2020. She received courses of antibiotics which did not help. She then had a biopsy and it actually showed lymphoma. The wound never really healed. And she developed a progressive ulcerated tumor. She underwent a course of radiation in late October into November 2020. This resulted in some improvement in the mass but she has been left with a deep punched out wound with exposed tendon. She was seen by Dr. Jaclynn [name] at the wound care center in [facility]. She considered her for operative debridement and/or consideration of hyperbaric oxygen. She was also seen in April on 05/07/2019 by Dr. Zigmund [name] at the wound care center in [facility]. Arterial and venous reflux studies were ordered but I am unable to see these results. 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 She has been using [name] for a prolonged period of time for perhaps 5 weeks with some improvement in the wound surface. She has not had the wound rebiopsied but she did have a PET scan on 03/18/2027 that did show residual infiltrating soft tissue density involving the left lower calf but without discrete measurable mass and minimal uptake. 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 At the same time there was no new areas of metabolically active lymphoma identified. She therefore has not been on any ongoing chemotherapy. She also had a CT scan of the left lower extremity on 05/21/2019 that did not show any fracture or dislocation of the left tibia or fibula large soft tissue ulcer overlying the distal tibia no evidence of bony erosion or sclerosis to suggest osteomyelitis. 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 , The patient has had prior recent arterial studies we did not do an ABI in this clinic today. 5/18; wound looks about the same. Certainly no improvement in condition of the wound bed. She has been using [name] change daily. Deep necrotic wound on the left lateral lower calf 6/1; absolutely no change in this wound. Completely nonviable surface with tendon in the middle. She ran out of [name] last week in my absence and has been using what sounds like Vache solution. I will refax the [name] to [facility] I have her echocardiogram report. Her valve area is 1.25 mild to moderate AS. I think  this is slightly worse than 3 years ago but this should not preclude her treatment with hyperbarics. I managed to creep in Dr. Kennon [name] interaction with his own nurse with regards to this question and I think he is approved that. I will text him just to make sure 6/15; some improvement in  the wound surface using [name] with daily dressing changes. She is washing this off daily. Patient started hyperbarics today. Electronic Signature(s) Signed: 07/01/2019 5:55:30 PM By: Sara Ham MD Entered By: [name] on 07/01/2019 17:28:29 -------------------------------------------------------------------------------- Physical Exam Details Patient Name: Date of Service: Sara RER, Sara Sara A. 07/01/2019 3:00 PM Medical Record Number: [id number] Patient Account Number: [id number] Date of Birth/Sex: Treating RN: 10-12-1929 (86 y.o. [name] Primary Care Provider: PA [name], [name] Other Clinician: Referring Provider: Treating Provider/Extender: [name] in Treatment: 5 Notes Wound exam; large punched-out area in the left mid calf with exposed tendon. Some improvement in that there is now of some exposed granulation. She she has not tolerated mechanical debridement to date I did not reattempt this but may attempt this in 2 or 3 weeks giving the [name] some time to work on the surface. She has no surrounding infection no palpable tenderness Electronic Signature(s) Signed: 07/01/2019 5:55:30 PM By: Sara Ham MD Entered By: [name] on 07/01/2019 17:29:26 -------------------------------------------------------------------------------- Physician Orders Details Patient Name: Date of Service: Sara RER, Sara Sara A. 07/01/2019 3:00 PM Medical Record Number: [id number] Patient Account Number: [id number] Date of Birth/Sex: Treating RN: 1929/04/22 (86 y.o. [name] Primary Care Provider: PA [name], [name] Other Clinician: Referring Provider: Treating Provider/Extender: [name] in Treatment: 5 Verbal / Phone Orders: No Diagnosis Coding ICD-10 Coding Code Description [phone] Non-pressure chronic ulcer of left calf with necrosis of muscle L59.9 Disorder of the skin and subcutaneous tissue related to radiation, unspecified C83.30 Diffuse  large B-cell lymphoma, unspecified site Follow-up Appointments Return Appointment in 2 weeks. Dressing Change Frequency Change dressing every day. Wound Cleansing Wound #1 Left,Lateral Lower Leg May shower and wash wound with soap and water. Primary Wound Dressing [name] Ointment Secondary Dressing Kerlix/Rolled Gauze - secure with tape Dry Gauze Hyperbaric Oxygen Therapy Indication: - soft tissue radiation necrosis If appropriate for treatment, begin HBOT per protocol: 2.0 ATA for 90 Minutes without A Breaks ir Total Number of Treatments: - 40 treatments One treatments per day (delivered Monday through Friday unless otherwise specified in Special Instructions below): Electronic Signature(s) Signed: 07/01/2019 5:55:30 PM By: Sara Ham MD Signed: 07/02/2019 9:54:21 AM By: Carlene Coria RN Entered By: Carlene Coria on 07/01/2019 14:54:01 -------------------------------------------------------------------------------- Problem List Details Patient Name: Date of Service: [facility], Sara Sara A. 07/01/2019 3:00 PM Medical Record Number: [id number] Patient Account Number: [id number] Date of Birth/Sex: Treating RN: 02/22/1929 (86 y.o. [name] Primary Care Provider: PA [name], [name] Other Clinician: Referring Provider: Treating Provider/Extender: [name] in Treatment: 5 Active Problems ICD-10 Encounter Code Description Active Date MDM Diagnosis L97.223 Non-pressure chronic ulcer of left calf with necrosis of muscle 05/27/2019 No Yes L59.9 Disorder of the skin and subcutaneous tissue related to radiation, unspecified 05/27/2019 No Yes C83.30 Diffuse large B-cell lymphoma, unspecified site 05/27/2019 No Yes Inactive Problems Resolved Problems Electronic Signature(s) Signed: 07/01/2019 5:55:30 PM By: Sara Ham MD Entered By: [name] on 07/01/2019 16:58:20 -------------------------------------------------------------------------------- Progress Note  Details Patient Name: Date of Service: Sara Benne, Sara Sara A. 07/01/2019 3:00 PM Medical Record Number: [id number] Patient Account Number: [id number] Date of Birth/Sex: Treating RN: 27-Nov-1929 (86 y.o. F)  Carlene Coria Primary Care Provider: PA [name], [name] Other Clinician: Referring Provider: Treating Provider/Extender: [name] in Treatment: 5 Subjective History of Present Illness (HPI) ADMISSION 05/27/2019 This is an 86 year old woman who came here for consideration of hyperbaric oxygen. She has a very complicated history. She is been treated for diffuse large B-cell lymphoma since 2006. She has had 2 recurrences of this. Her most recent chemotherapy round was Treanda plus polatuzumab which ended I believe in February. She is not currently on chemotherapy. She came to see [place] with a large wound on the left lateral lower leg. Apparently this started as a lump sometime in October 2020. She received courses of antibiotics which did not help. She then had a biopsy and it actually showed lymphoma. The wound never really healed. And she developed a progressive ulcerated tumor. She underwent a course of radiation in late October into November 2020. This resulted in some improvement in the mass but she has been left with a deep punched out wound with exposed tendon. She was seen by Dr. Jaclynn [name] at the wound care center in [facility]. She considered her for operative debridement and/or consideration of hyperbaric oxygen. She was also seen in April on 05/07/2019 by Dr. Zigmund [name] at the wound care center in [facility]. Arterial and venous reflux studies were ordered but I am unable to see these results. 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 She has been using  [name] for a prolonged period of time for perhaps 5 weeks with some improvement in the wound surface. She has not had the wound rebiopsied but she did have a PET scan on 03/18/2027 that did show residual infiltrating soft tissue density involving the left lower calf but without discrete measurable mass and minimal uptake. 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 At the same time there was no new areas of metabolically active lymphoma identified. She therefore has not been on any ongoing chemotherapy. She also had a CT scan of the left lower extremity on 05/21/2019 that did not show any fracture or dislocation of the left tibia or fibula large soft tissue ulcer overlying the distal tibia no evidence of bony erosion or sclerosis to suggest osteomyelitis. 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 , The patient has had prior recent arterial studies we did not do an ABI in this clinic today. 5/18; wound looks about the same. Certainly no improvement in condition of the wound bed. She has been using [name] change daily. Deep necrotic wound on the left lateral lower calf 6/1; absolutely no change in this wound. Completely nonviable surface with tendon in the middle. She ran out of [name] last week in my absence and has been using what sounds like Vache solution. I will refax the [name] to [facility] I have her echocardiogram report. Her valve area is 1.25 mild to moderate AS. I think this is slightly worse than 3 years ago but this should not preclude her treatment with hyperbarics. I managed to creep in Dr. Kennon [name] interaction with his own nurse with regards to this question and I think he is approved  that. I will text him just to make sure 6/15; some improvement in the wound surface using [name] with daily dressing changes. She is  washing this off daily. Patient started hyperbarics today. Objective Constitutional Vitals Time Taken: 3:22 PM, Height: 65 in, Weight: 138 lbs, BMI: 23, Temperature: 98.2 F, Pulse: 78 bpm, Respiratory Rate: 15 breaths/min, Blood Pressure: 112/62 mmHg. Integumentary (Hair, Skin) Wound #1 status is Open. Original cause of wound was Bump. The wound is located on the Left,Lateral Lower Leg. The wound measures 7cm length x 3.7cm width x 0.6cm depth; 20.342cm^2 area and 12.205cm^3 volume. There is muscle, tendon, and Fat Layer (Subcutaneous Tissue) Exposed exposed. There is no tunneling or undermining noted. There is a medium amount of purulent drainage noted. The wound margin is distinct with the outline attached to the wound base. There is small (1-33%) pink granulation within the wound bed. There is a large (67-100%) amount of necrotic tissue within the wound bed including Adherent Slough. Assessment Active Problems ICD-10 Non-pressure chronic ulcer of left calf with necrosis of muscle Disorder of the skin and subcutaneous tissue related to radiation, unspecified Diffuse large B-cell lymphoma, unspecified site Plan Follow-up Appointments: Return Appointment in 2 weeks. Dressing Change Frequency: Change dressing every day. Wound Cleansing: Wound #1 Left,Lateral Lower Leg: May shower and wash wound with soap and water. Primary Wound Dressing: [name] Ointment Secondary Dressing: Kerlix/Rolled Gauze - secure with tape Dry Gauze Hyperbaric Oxygen Therapy: Indication: - soft tissue radiation necrosis If appropriate for treatment, begin HBOT per protocol: 2.0 ATA for 90 Minutes without Air Breaks T Number of Treatments: - 40 treatments otal One treatments per day (delivered Monday through Friday unless otherwise specified in Special Instructions below): 1. We continue with [name] daily dressing changes 2. She had her first treatment of hyperbaric oxygen therapy for soft tissue  radionecrosis. She tolerated this exceptionally well Electronic Signature(s) Signed: 07/01/2019 5:55:30 PM By: Sara Ham MD Entered By: [name] on 07/01/2019 17:30:03 -------------------------------------------------------------------------------- SuperBill Details Patient Name: Date of Service: [facility], Sara Sara A. 07/01/2019 Medical Record Number: [id number] Patient Account Number: [id number] Date of Birth/Sex: Treating RN: 1929-10-08 (86 y.o. [name] Primary Care Provider: PA [name], [name] Other Clinician: Referring Provider: Treating Provider/Extender: [name] in Treatment: 5 Diagnosis Coding ICD-10 Codes Code Description [phone] Non-pressure chronic ulcer of left calf with necrosis of muscle L59.9 Disorder of the skin and subcutaneous tissue related to radiation, unspecified C83.30 Diffuse large B-cell lymphoma, unspecified site Facility Procedures CPT4 Code: [id number] Description: 62563 - WOUND CARE VISIT-LEV 3 EST PT Modifier: Quantity: 1 Electronic Signature(s) Signed: 07/01/2019 5:55:30 PM By: Sara Ham MD Entered By: [name] on 07/01/2019 17:30:14

## 2019-07-03 ENCOUNTER — Encounter (HOSPITAL_BASED_OUTPATIENT_CLINIC_OR_DEPARTMENT_OTHER): Payer: Medicare Other | Admitting: Internal Medicine

## 2019-07-03 ENCOUNTER — Other Ambulatory Visit: Payer: Self-pay

## 2019-07-03 DIAGNOSIS — L598 Other specified disorders of the skin and subcutaneous tissue related to radiation: Secondary | ICD-10-CM | POA: Diagnosis not present

## 2019-07-03 NOTE — Progress Notes (Signed)
[name], [name] ([id number]) Visit Report for 07/01/2019 Arrival Information Details Patient Name: Date of Service: [place], [name] 07/01/2019 3:00 PM Medical Record Number: [id number] Patient Account Number: [id number] Date of Birth/Sex: Treating RN: 01/30/1929 (86 y.o. [name] Primary Care Morayma Godown: PA [name], [name] Other Clinician: Referring Italia Wolfert: Treating Tine Mabee/Extender: [name] in Treatment: 5 Visit Information History Since Last Visit Added or deleted any medications: No Patient Arrived: Walker Any new allergies or adverse reactions: No Arrival Time: 15:22 Had a fall or experienced change in No Accompanied By: daughter activities of daily living that may affect Transfer Assistance: None risk of falls: Patient Identification Verified: Yes Signs or symptoms of abuse/neglect since last visito No Secondary Verification Process Completed: Yes Hospitalized since last visit: No Patient Requires Transmission-Based Precautions: No Implantable device outside of the clinic excluding No Patient Has Alerts: No cellular tissue based products placed in the center since last visit: Has Dressing in Place as Prescribed: Yes Pain Present Now: No Electronic Signature(s) Signed: 07/03/2019 12:45:44 PM By: [name] Entered By: [name] on 07/01/2019 15:22:35 -------------------------------------------------------------------------------- Clinic Level of Care Assessment Details Patient Name: Date of Service: [place], [name] 07/01/2019 3:00 PM Medical Record Number: [id number] Patient Account Number: [id number] Date of Birth/Sex: Treating RN: 1929/10/29 (86 y.o. [name] Primary Care Malak Orantes: PA [name], [name] Other Clinician: Referring Avenly Roberge: Treating Detavious Rinn/Extender: [name] in Treatment: 5 Clinic Level of Care Assessment Items TOOL 4 Quantity Score X- 1 0 Use when only an EandM is performed on FOLLOW-UP  visit ASSESSMENTS - Nursing Assessment / Reassessment X- 1 10 Reassessment of Co-morbidities (includes updates in patient status) X- 1 5 Reassessment of Adherence to Treatment Plan ASSESSMENTS - Wound and Skin A ssessment / Reassessment X - Simple Wound Assessment / Reassessment - one wound 1 5 []  - 0 Complex Wound Assessment / Reassessment - multiple wounds []  - 0 Dermatologic / Skin Assessment (not related to wound area) ASSESSMENTS - Focused Assessment []  - 0 Circumferential Edema Measurements - multi extremities []  - 0 Nutritional Assessment / Counseling / Intervention []  - 0 Lower Extremity Assessment (monofilament, tuning fork, pulses) []  - 0 Peripheral Arterial Disease Assessment (using hand held doppler) ASSESSMENTS - Ostomy and/or Continence Assessment and Care []  - 0 Incontinence Assessment and Management []  - 0 Ostomy Care Assessment and Management (repouching, etc.) PROCESS - Coordination of Care X - Simple Patient / Family Education for ongoing care 1 15 []  - 0 Complex (extensive) Patient / Family Education for ongoing care X- 1 10 Staff obtains Programmer, systems, Records, T Results / Process Orders est []  - 0 Staff telephones HHA, Nursing Homes / Clarify orders / etc []  - 0 Routine Transfer to another Facility (non-emergent condition) []  - 0 Routine Hospital Admission (non-emergent condition) []  - 0 New Admissions / Biomedical engineer / Ordering NPWT Apligraf, etc. , []  - 0 Emergency Hospital Admission (emergent condition) X- 1 10 Simple Discharge Coordination []  - 0 Complex (extensive) Discharge Coordination PROCESS - Special Needs []  - 0 Pediatric / Minor Patient Management []  - 0 Isolation Patient Management []  - 0 Hearing / Language / Visual special needs []  - 0 Assessment of Community assistance (transportation, D/C planning, etc.) []  - 0 Additional assistance / Altered mentation []  - 0 Support Surface(s) Assessment (bed, cushion, seat,  etc.) INTERVENTIONS - Wound Cleansing / Measurement X - Simple Wound Cleansing - one wound 1 5 []  - 0 Complex Wound Cleansing - multiple wounds X- 1  5 Wound Imaging (photographs - any number of wounds) []  - 0 Wound Tracing (instead of photographs) X- 1 5 Simple Wound Measurement - one wound []  - 0 Complex Wound Measurement - multiple wounds INTERVENTIONS - Wound Dressings []  - 0 Small Wound Dressing one or multiple wounds X- 1 15 Medium Wound Dressing one or multiple wounds []  - 0 Large Wound Dressing one or multiple wounds X- 1 5 Application of Medications - topical []  - 0 Application of Medications - injection INTERVENTIONS - Miscellaneous []  - 0 External ear exam []  - 0 Specimen Collection (cultures, biopsies, blood, body fluids, etc.) []  - 0 Specimen(s) / Culture(s) sent or taken to Lab for analysis []  - 0 Patient Transfer (multiple staff / Civil Service fast streamer / Similar devices) []  - 0 Simple Staple / Suture removal (25 or less) []  - 0 Complex Staple / Suture removal (26 or more) []  - 0 Hypo / Hyperglycemic Management (close monitor of Blood Glucose) []  - 0 Ankle / Brachial Index (ABI) - do not check if billed separately X- 1 5 Vital Signs Has the patient been seen at the hospital within the last three years: Yes Total Score: 95 Level Of Care: New/Established - Level 3 Electronic Signature(s) Signed: 07/02/2019 9:54:21 AM By: Sara Coria RN Entered By: [name] on 07/01/2019 16:28:21 -------------------------------------------------------------------------------- Encounter Discharge Information Details Patient Name: Date of Service: [name], [name] 07/01/2019 3:00 PM Medical Record Number: [id number] Patient Account Number: [id number] Date of Birth/Sex: Treating RN: 1929-03-20 (86 y.o. [name] Primary Care Hartman Minahan: PA [name], [name] Other Clinician: Referring Kevin Mario: Treating Hazelee Harbold/Extender: [name] in Treatment: 5 Encounter  Discharge Information Items Discharge Condition: Stable Ambulatory Status: Walker Discharge Destination: Home Transportation: Private Auto Accompanied By: daughter Schedule Follow-up Appointment: Yes Clinical Summary of Care: Electronic Signature(s) Signed: 07/01/2019 5:41:07 PM By: [name] Entered By: [name] on 07/01/2019 16:39:42 -------------------------------------------------------------------------------- Lower Extremity Assessment Details Patient Name: Date of Service: [place], [name] 07/01/2019 3:00 PM Medical Record Number: [id number] Patient Account Number: [id number] Date of Birth/Sex: Treating RN: 20-May-1929 (86 y.o. [name] Primary Care Margeart Allender: PA [name], [name] Other Clinician: Referring Bradie Lacock: Treating Caddie Randle/Extender: [name] in Treatment: 5 Edema Assessment Assessed: [Left: Yes] [Right: No] Edema: [Left: Ye] [Right: s] Calf Left: Right: Point of Measurement: cm From Medial Instep 33 cm cm Ankle Left: Right: Point of Measurement: cm From Medial Instep 19 cm cm Vascular Assessment Pulses: Dorsalis Pedis Palpable: [Left:Yes] Electronic Signature(s) Signed: 07/01/2019 5:41:07 PM By: [name] Entered By: [name] on 07/01/2019 15:42:59 -------------------------------------------------------------------------------- [place] Details Patient Name: Date of Service: [facility], [name] 07/01/2019 3:00 PM Medical Record Number: [id number] Patient Account Number: [id number] Date of Birth/Sex: Treating RN: 05/28/1929 (86 y.o. [name] Primary Care Quanta Roher: PA [name], [name] Other Clinician: Referring Jireh Vinas: Treating Nadea Kirkland/Extender: [name] in Treatment: 5 Active Inactive HBO Nursing Diagnoses: Anxiety related to feelings of confinement associated with the hyperbaric oxygen chamber Anxiety related to knowledge deficit of hyperbaric oxygen therapy and treatment  procedures Goals: Patient and/or family will be able to state/discuss factors appropriate to the management of their disease process during treatment Date Initiated: 05/27/2019 Target Resolution Date: 06/27/2019 Goal Status: Active Patient will tolerate the hyperbaric oxygen therapy treatment Date Initiated: 05/27/2019 Target Resolution Date: 06/27/2019 Goal Status: Active Patient will tolerate the internal climate of the chamber Date Initiated: 05/27/2019 Target Resolution Date: 06/27/2019 Goal Status: Active Patient/caregiver will verbalize understanding of HBO goals,  rationale, procedures and potential hazards Date Initiated: 05/27/2019 Target Resolution Date: 06/27/2019 Goal Status: Active Interventions: Administer a five (5) minute air break for patient if signs and symptoms of seizure appear and notify the hyperbaric physician Administer decongestants, per physician orders, prior to HBO2 Administer the correct therapeutic gas delivery based on the patients needs and limitations, per physician order Assess and provide for patients comfort related to the hyperbaric environment and equalization of middle ear Assess for signs and symptoms related to adverse events, including but not limited to confinement anxiety, pneumothorax, oxygen toxicity and baurotrauma Assess patient's knowledge and expectations regarding hyperbaric medicine and provide education related to the hyperbaric environment, goals of treatment and prevention of adverse events Notes: Wound/Skin Impairment Nursing Diagnoses: Knowledge deficit related to ulceration/compromised skin integrity Goals: Patient/caregiver will verbalize understanding of skin care regimen Date Initiated: 05/27/2019 Target Resolution Date: 07/31/2019 Goal Status: Active Ulcer/skin breakdown will have a volume reduction of 30% by week 4 Date Initiated: 05/27/2019 Date Inactivated: 07/01/2019 Target Resolution Date: 06/27/2019 Goal Status:  Unmet Unmet Reason: comorbities Ulcer/skin breakdown will have a volume reduction of 50% by week 8 Date Initiated: 07/01/2019 Target Resolution Date: 07/31/2019 Goal Status: Active Interventions: Assess patient/caregiver ability to obtain necessary supplies Assess patient/caregiver ability to perform ulcer/skin care regimen upon admission and as needed Assess ulceration(s) every visit Notes: Electronic Signature(s) Signed: 07/02/2019 9:54:21 AM By: Sara Coria RN Entered By: [name] on 07/01/2019 16:04:02 -------------------------------------------------------------------------------- Pain Assessment Details Patient Name: Date of Service: [name], [name] 07/01/2019 3:00 PM Medical Record Number: [id number] Patient Account Number: [id number] Date of Birth/Sex: Treating RN: May 13, 1929 (86 y.o. [name] Primary Care Drexler Maland: PA [name], [name] Other Clinician: Referring Blue Ruggerio: Treating Roselyn Doby/Extender: [name] in Treatment: 5 Active Problems Location of Pain Severity and Description of Pain Patient Has Paino No Site Locations Pain Management and Medication Current Pain Management: Electronic Signature(s) Signed: 07/02/2019 9:54:21 AM By: Sara Coria RN Signed: 07/03/2019 12:45:44 PM By: [name] Entered By: [name] on 07/01/2019 15:23:16 -------------------------------------------------------------------------------- Patient/Caregiver Education Details Patient Name: Date of Service: [name], [name] 6/15/2021andnbsp3:00 PM Medical Record Number: [id number] Patient Account Number: [id number] Date of Birth/Gender: Treating RN: [birth year] (86 y.o. [name] Primary Care Physician: PA [name], [name] Other Clinician: Referring Physician: Treating Physician/Extender: [name] in Treatment: 5 Education Assessment Education Provided To: Patient Education Topics Provided Wound/Skin Impairment: Methods:  Explain/Verbal Responses: State content correctly Electronic Signature(s) Signed: 07/02/2019 9:54:21 AM By: Sara Coria RN Entered By: [name] on 07/01/2019 15:00:12 -------------------------------------------------------------------------------- Wound Assessment Details Patient Name: Date of Service: [place], [name] 07/01/2019 3:00 PM Medical Record Number: [id number] Patient Account Number: [id number] Date of Birth/Sex: Treating RN: 12/13/1929 (86 y.o. 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, Lateral Lower Leg Wound Open Wounding Event: Bump Status: Date Acquired: 10/17/2018 Comorbid Cataracts, Deep Vein Thrombosis, Hypertension, Osteoarthritis, Weeks Of Treatment: 5 History: Neuropathy, Received Chemotherapy, Received Radiation, Clustered Wound: No Confinement Anxiety Photos Photo Uploaded By: [name] on 07/02/2019 15:59:07 Wound Measurements Length: (cm) 7 Width: (cm) 3.7 Depth: (cm) 0.6 Area: (cm) 20.342 Volume: (cm) 12.205 % Reduction in Area: -2.9% % Reduction in Volume: -54.4% Epithelialization: Small (1-33%) Tunneling: No Undermining: No Wound Description Classification: Full Thickness With Exposed Support Structures Wound Margin: Distinct, outline attached Exudate Amount: Medium Exudate Type: Purulent  Exudate Color: yellow, brown, green Wound Bed Granulation Amount: Small (1-33%) Granulation Quality: Pink Necrotic Amount: Large (67-100%) Necrotic Quality: Adherent Slough Foul Odor After Cleansing: No Slough/Fibrino Yes Exposed Structure Fascia Exposed: No Fat Layer (Subcutaneous Tissue) Exposed: Yes Tendon Exposed: Yes Muscle Exposed: Yes Necrosis of Muscle: No Joint Exposed: No Bone Exposed: No Treatment Notes Wound #1  (Left, Lateral Lower Leg) 1. Cleanse With Wound Cleanser Soap and water 2. Periwound Care Skin Prep 3. Primary Dressing Applied [name] 4. Secondary Dressing Foam Border Dressing 5. Secured With Office manager) Signed: 07/01/2019 5:41:07 PM By: [name] Signed: 07/02/2019 9:54:21 AM By: Sara Coria RN Entered By: [name] on 07/01/2019 15:43:21 -------------------------------------------------------------------------------- Vitals Details Patient Name: Date of Service: [name], [name] 07/01/2019 3:00 PM Medical Record Number: [id number] Patient Account Number: [id number] Date of Birth/Sex: Treating RN: 01/31/1929 (86 y.o. [name] Primary Care Tayona Sarnowski: PA [name], [name] Other Clinician: Referring Javontay Vandam: Treating Fareeha Evon/Extender: [name] in Treatment: 5 Vital Signs Time Taken: 15:22 Temperature (F): 98.2 Height (in): 65 Pulse (bpm): 78 Weight (lbs): 138 Respiratory Rate (breaths/min): 15 Body Mass Index (BMI): 23 Blood Pressure (mmHg): 112/62 Reference Range: 80 - 120 mg / dl Electronic Signature(s) Signed: 07/03/2019 12:45:44 PM By: [name] Entered By: [name] on 07/01/2019 15:23:11

## 2019-07-04 ENCOUNTER — Encounter (HOSPITAL_BASED_OUTPATIENT_CLINIC_OR_DEPARTMENT_OTHER): Payer: Medicare Other | Admitting: Internal Medicine

## 2019-07-04 ENCOUNTER — Other Ambulatory Visit: Payer: Self-pay

## 2019-07-04 DIAGNOSIS — L598 Other specified disorders of the skin and subcutaneous tissue related to radiation: Secondary | ICD-10-CM | POA: Diagnosis not present

## 2019-07-04 NOTE — Progress Notes (Signed)
[name], [name] ([id number]) Visit Report for 07/02/2019 Arrival Information Details Patient Name: Date of Service: [place], [name] 07/02/2019 3:00 PM Medical Record Number: [id number] Patient Account Number: [id number] Date of Birth/Sex: Treating RN: [birth year] (86 y.o. [name], [name] Primary Care Magin Balbi: [name], [name] Other Clinician: [name] Referring [name]: Treating [name]/Extender: [name] in Treatment: 5 Visit Information History Since Last Visit Added or deleted any medications: No Patient Arrived: [name] Any new allergies or adverse reactions: No Arrival Time: 14:45 Had a fall or experienced change in No Accompanied By: self activities of daily living that may affect Transfer Assistance: None risk of falls: Patient Identification Verified: Yes Signs or symptoms of abuse/neglect since last visito No Secondary Verification Process Completed: Yes Hospitalized since last visit: No Patient Requires Transmission-Based Precautions: No Implantable device outside of the clinic excluding No Patient Has Alerts: No cellular tissue based products placed in the center since last visit: Pain Present Now: No Electronic Signature(s) Signed: 07/04/2019 8:50:29 AM By: [name] Entered By: [name] on 07/02/2019 14:53:08 -------------------------------------------------------------------------------- Encounter Discharge Information Details Patient Name: Date of Service: Sara RER, Sara [name] 07/02/2019 3:00 PM Medical Record Number: [id number] Patient Account Number: [id number] Date of Birth/Sex: Treating RN: 02/04/1929 (86 y.o. [name] Primary Care Rion Catala: [name], [name] Other Clinician: [name] Referring [name]: Treating [name]/Extender: [name] in Treatment: 5 Encounter Discharge Information Items Discharge Condition: Stable Ambulatory Status: Ambulatory Discharge Destination:  Home Transportation: Private Auto Accompanied By: self Schedule Follow-up Appointment: Yes Clinical Summary of Care: Patient Declined Electronic Signature(s) Signed: 07/04/2019 8:50:29 AM By: [name] Entered By: [name] on 07/02/2019 16:55:35 -------------------------------------------------------------------------------- Patient/Caregiver Education Details Patient Name: Date of Service: Sara Benne, [name] 6/16/2021andnbsp3:00 PM Medical Record Number: [id number] Patient Account Number: [id number] Date of Birth/Gender: Treating RN: 1929-06-02 (86 y.o. [name] Primary Care Physician: [name], [name] Other Clinician: [name] Referring Physician: Treating Physician/Extender: [name] in Treatment: 5 Education Assessment Education Provided To: Patient Education Topics Provided Hyperbaric Oxygenation: Methods: Explain/Verbal Responses: State content correctly Electronic Signature(s) Signed: 07/04/2019 8:50:29 AM By: [name] Entered By: [name] on 07/02/2019 16:55:18 -------------------------------------------------------------------------------- Vitals Details Patient Name: Date of Service: Sara RER, Sara [name] 07/02/2019 3:00 PM Medical Record Number: [id number] Patient Account Number: [id number] Date of Birth/Sex: Treating RN: 05-27-1929 (86 y.o. [name] Primary Care Jesus Nevills: [name], [name] Other Clinician: [name] Referring Shamirah Ivan: Treating Nilaya Bouie/Extender: [name] in Treatment: 5 Vital Signs Time Taken: 14:47 Temperature (F): 97.9 Height (in): 65 Pulse (bpm): 73 Weight (lbs): 138 Respiratory Rate (breaths/min): 16 Body Mass Index (BMI): 23 Blood Pressure (mmHg): 112/68 Reference Range: 80 - 120 mg / dl Electronic Signature(s) Signed: 07/04/2019 8:50:29 AM By: [name] Entered By: [name] on 07/02/2019 14:53:51

## 2019-07-04 NOTE — Progress Notes (Signed)
Sara, [name] ([id number]) Visit Report for 07/03/2019 HBO Details Patient Name: Date of Service: [place], [name] 07/03/2019 3:00 PM Medical Record Number: [id number] Patient Account Number: [id number] Date of Birth/Sex: Treating RN: July 18, 1929 (86 y.o. 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 HBO Treatment Details Treatment Number: 3 Patient Type: Outpatient Chamber Type: Monoplace Chamber Serial #: [id number] Treatment Protocol: 2.0 ATA with 90 minutes oxygen, and no air breaks Treatment Details Compression Rate Down: 1.0 psi / minute De-Compression Rate Up: 2.0 psi / minute Air breaks and breathing Decompress Decompress Compress Tx Pressure Begins Reached periods Begins Ends (leave unused spaces blank) Chamber Pressure (ATA 1 2 ------2 1 ) Clock Time (24 hr) 14:53 15:05 - - - - - - 16:35 16:41 Treatment Length: 108 (minutes) Treatment Segments: 4 Vital Signs Capillary Blood Glucose Reference Range: 80 - 120 mg / dl HBO Diabetic Blood Glucose Intervention Range: <131 mg/dl or >249 mg/dl Time Vitals Blood Respiratory Capillary Blood Glucose Pulse Action Type: Pulse: Temperature: Taken: Pressure: Rate: Glucose (mg/dl): Meter #: Oximetry (%) Taken: Pre 14:39 105/61 68 14 97.6 Post 16:43 129/61 61 14 97.7 Treatment Response Treatment Toleration: Well Treatment Completion Status: Treatment Completed without Adverse Event Daylah Sayavong Notes No concerns with treatment given Physician HBO Attestation: I certify that I supervised this HBO treatment in accordance with Medicare guidelines. A trained emergency  response team is readily available per Yes hospital policies and procedures. Continue HBOT as ordered. Yes Electronic Signature(s) Signed: 07/04/2019 7:45:08 AM By: Linton Ham MD Entered By: Linton Ham on 07/03/2019 17:57:36 -------------------------------------------------------------------------------- HBO Safety Checklist Details Patient Name: Date of Service: [facility], [name] 07/03/2019 3:00 PM Medical Record Number: [id number] Patient Account Number: [id number] Date of Birth/Sex: Treating RN: 1929-05-13 (86 y.o. [name], Meta.Reding Primary Care Caleyah Jr: PA [place], [place] Other Clinician: Referring Ivanka Kirshner: Treating Jorene Kaylor/Extender: [name] in Treatment: 5 HBO Safety Checklist Items Safety Checklist Consent Form Signed Patient voided / foley secured and emptied When did you last eato N/A Last dose of injectable or oral agent Ostomy pouch emptied and vented if applicable NA All implantable devices assessed, documented and approved NA Intravenous access site secured and place NA Valuables secured Linens and cotton and cotton/polyester blend (less than 51% polyester) Personal oil-based products / skin lotions / body lotions removed Wigs or hairpieces removed Smoking or tobacco materials removed NA Books / newspapers / magazines / loose paper removed Cologne, aftershave, perfume and deodorant removed Jewelry removed (may wrap wedding band) Make-up removed Hair care products removed Battery operated devices (external) removed NA Heating patches and chemical warmers removed NA Titanium eyewear removed NA Nail polish cured greater than 10 hours NA Casting material cured greater than 10 hours NA Hearing aids removed NA Loose dentures or partials removed NA Prosthetics have been removed NA Patient demonstrates correct use of air break device (if applicable) Patient concerns have been addressed Patient grounding bracelet on and cord  attached to chamber Specifics for Inpatients (complete in addition to above) Medication sheet sent with patient Intravenous medications needed or due during therapy sent with patient Drainage tubes (e.g. nasogastric tube or chest tube secured and vented) Endotracheal or Tracheotomy tube secured Cuff deflated of air and inflated  with saline Airway suctioned Electronic Signature(s) Signed: 07/04/2019 8:50:29 AM By: [name] Entered By: [name] on 07/03/2019 15:29:56

## 2019-07-04 NOTE — Progress Notes (Signed)
Sara, [name] ([id number]) Visit Report for 07/01/2019 Arrival Information Details Patient Name: Date of Service: [place], [name] 07/01/2019 1:00 PM Medical Record Number: [id number] Patient Account Number: [id number] Date of Birth/Sex: Treating RN: 1929/12/01 (86 y.o. F) Primary Care Maylee Bare: PA TIENT, NO Other Clinician: [name] Referring Sunya Humbarger: Treating Rollyn Scialdone/Extender: [name] in Treatment: 5 Visit Information History Since Last Visit Added or deleted any medications: No Patient Arrived: [name] Any new allergies or adverse reactions: No Arrival Time: 12:50 Had a fall or experienced change in No Accompanied By: self activities of daily living that may affect Transfer Assistance: None risk of falls: Patient Identification Verified: Yes Signs or symptoms of abuse/neglect since last visito No Secondary Verification Process Completed: Yes Hospitalized since last visit: No Patient Requires Transmission-Based Precautions: No Implantable device outside of the clinic excluding No Patient Has Alerts: No cellular tissue based products placed in the center since last visit: Pain Present Now: No Electronic Signature(s) Signed: 07/04/2019 8:50:29 AM By: [name] Entered By: [name] on 07/01/2019 13:05:44 -------------------------------------------------------------------------------- Encounter Discharge Information Details Patient Name: Date of Service: Sara RER, [name] 07/01/2019 1:00 PM Medical Record Number: [id number] Patient Account Number: [id number] Date of Birth/Sex: Treating RN: 1929-03-31 (86 y.o. F) Primary Care Keirra Zeimet: PA [name], NO Other Clinician: [name] Referring Gigi Onstad: Treating Leeya Rusconi/Extender: [name] in Treatment: 5 Encounter Discharge Information Items Discharge Condition: Stable Ambulatory Status: Ambulatory Discharge Destination: Home Transportation: Private Auto Accompanied By:  daughter Schedule Follow-up Appointment: Yes Clinical Summary of Care: Patient Declined Electronic Signature(s) Signed: 07/04/2019 8:50:29 AM By: [name] Entered By: [name] on 07/01/2019 15:22:33 -------------------------------------------------------------------------------- Patient/Caregiver Education Details Patient Name: Date of Service: Sara Benne, [name] 6/15/2021andnbsp1:00 PM Medical Record Number: [id number] Patient Account Number: [id number] Date of Birth/Gender: Treating RN: April 05, 1929 (86 y.o. F) Primary Care Physician: PA [name], NO Other Clinician: [name] Referring Physician: Treating Physician/Extender: [name] in Treatment: 5 Education Assessment Education Provided To: Patient Education Topics Provided Hyperbaric Oxygenation: Methods: Explain/Verbal Responses: State content correctly Electronic Signature(s) Signed: 07/04/2019 8:50:29 AM By: [name] Entered By: [name] on 07/01/2019 15:21:11 -------------------------------------------------------------------------------- Vitals Details Patient Name: Date of Service: Sara Benne, [name] 07/01/2019 1:00 PM Medical Record Number: [id number] Patient Account Number: [id number] Date of Birth/Sex: Treating RN: 1929/01/24 (86 y.o. F) Primary Care Fumio Vandam: PA TIENT, NO Other Clinician: [name] Referring Leif Loflin: Treating Camella Seim/Extender: [name] in Treatment: 5 Vital Signs Time Taken: 12:53 Temperature (F): 98.2 Height (in): 65 Pulse (bpm): 78 Weight (lbs): 138 Respiratory Rate (breaths/min): 15 Body Mass Index (BMI): 23 Blood Pressure (mmHg): 112/62 Reference Range: 80 - 120 mg / dl Electronic Signature(s) Signed: 07/04/2019 8:50:29 AM By: [name] Entered By: [name] on 07/01/2019 13:06:16

## 2019-07-04 NOTE — Progress Notes (Signed)
[name], [name] ([id number]) Visit Report for 07/01/2019 SuperBill Details Patient Name: Date of Service: [name], Sara [name] 07/01/2019 Medical Record Number: [id number] Patient Account Number: [id number] Date of Birth/Sex: Treating RN: 18-Sep-1929 (86 y.o. 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

## 2019-07-04 NOTE — Progress Notes (Signed)
[name], [name] ([id number]) Visit Report for 07/03/2019 Arrival Information Details Patient Name: Date of Service: [name], Sara A. 07/03/2019 3:00 PM Medical Record Number: [id number] Patient Account Number: [id number] Date of Birth/Sex: Treating RN: 08-26-1929 (86 y.o. Helene Shoe, Meta.Reding Primary Care Deyanira Fesler: PA Haig [name], Sara Other Clinician: [name] Referring Kathyleen Radice: Treating Maryori Weide/Extender: [name] in Treatment: [name] Visit Information History Since Last Visit Added or deleted any medications: No Patient Arrived: [name] Any new allergies or adverse reactions: No Arrival Time: 14:37 Had a fall or experienced change in No Accompanied By: self activities of daily living that may affect Transfer Assistance: None risk of falls: Patient Identification Verified: Yes Signs or symptoms of abuse/neglect since last visito No Secondary Verification Process Completed: Yes Hospitalized since last visit: No Patient Requires Transmission-Based Precautions: No Implantable device outside of the clinic excluding No Patient Has Alerts: No cellular tissue based products placed in the center since last visit: Pain Present Now: No Electronic Signature(s) Signed: 07/04/2019 8:50:29 AM By: [name] Entered By: [name] on 07/03/2019 14:43:52 -------------------------------------------------------------------------------- Encounter Discharge Information Details Patient Name: Date of Service: [name], [name] 07/03/2019 3:00 PM Medical Record Number: [id number] Patient Account Number: [id number] Date of Birth/Sex: Treating RN: [birth year] (86 y.o. [name] Primary Care Keiera Strathman: PA Haig [name], Sara Other Clinician: [name] Referring Yarima Penman: Treating Calton Harshfield/Extender: [name] in Treatment: [name] Encounter Discharge Information Items Discharge Condition: Stable Ambulatory Status: [place] Discharge Destination: Home Transportation:  Private Auto Accompanied By: self Schedule Follow-up Appointment: Yes Clinical Summary of Care: Patient Declined Electronic Signature(s) Signed: 07/04/2019 8:50:29 AM By: [name] Entered By: [name] on 07/03/2019 16:49:22 -------------------------------------------------------------------------------- Patient/Caregiver Education Details Patient Name: Date of Service: [name], [name] 6/17/2021andnbsp3:00 PM Medical Record Number: [id number] Patient Account Number: [id number] Date of Birth/Gender: Treating RN: 1929/07/15 (86 y.o. [name], [name] Education Assessment Education Provided To: Patient Education Topics Provided Hyperbaric Oxygenation: Methods: Explain/Verbal Responses: State content correctly Electronic Signature(s) Signed: 07/04/2019 8:50:29 AM By: [name] Entered By: [name] on 07/03/2019 16:49:01 -------------------------------------------------------------------------------- Vitals Details Patient Name: Date of Service: [name], [name] 07/03/2019 3:00 PM Medical Record Number: [id number] Patient Account Number: [id number] Date of Birth/Sex: Treating RN: [birth year] (86 y.o. [name] Primary Care Julion Gatt: PA Haig [name], Sara Other Clinician: [name] Referring Kayman Snuffer: Treating Laytoya Ion/Extender: [name] in Treatment: [name] Vital Signs Time Taken: 14:39 Temperature (F): 97.6 Height (in): 65 Pulse (bpm): 68 Weight (lbs): 138 Respiratory Rate (breaths/min): 14 Body Mass Index (BMI): 23 Blood Pressure (mmHg): 105/61 Reference Range: 80 - 120 mg / dl Electronic Signature(s) Signed: 07/04/2019 8:50:29 AM By: [name] Entered By: [name] on 07/03/2019 14:44:15

## 2019-07-04 NOTE — Progress Notes (Signed)
DAPHNA, LAFUENTE ([id number]) Visit Report for 07/02/2019 SuperBill Details Patient Name: Date of Service: [place], [name] 07/02/2019 Medical Record Number: [id number] Patient Account Number: [id number] Date of Birth/Sex: Treating RN: 12-28-1929 (86 y.o. 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

## 2019-07-04 NOTE — Progress Notes (Signed)
OMEKA, HOLBEN ([id number]) Visit Report for 07/03/2019 SuperBill Details Patient Name: Date of Service: [place], [name] 07/03/2019 Medical Record Number: [id number] Patient Account Number: [id number] Date of Birth/Sex: Treating RN: 12-Jul-1929 (86 y.o. 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

## 2019-07-04 NOTE — Progress Notes (Signed)
LIA, VIGILANTE ([id number]) Visit Report for 07/04/2019 SuperBill Details Patient Name: Date of Service: [place], [name] 07/04/2019 Medical Record Number: [id number] Patient Account Number: [id number] Date of Birth/Sex: Treating RN: 03-Jan-1930 (86 y.o. 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

## 2019-07-04 NOTE — Progress Notes (Signed)
[name], [name] ([id number]) Visit Report for 07/02/2019 HBO Details Patient Name: Date of Service: [name], [name] Sara A. 07/02/2019 3:00 PM Medical Record Number: [id number] Patient Account Number: [id number] Date of Birth/Sex: Treating RN: 13-Jul-1929 (86 y.o. 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 HBO Treatment Details Treatment Number: 2 Patient Type: Outpatient Chamber Type: Monoplace Chamber Serial #: [id number] Treatment Protocol: 2.0 ATA with 90 minutes oxygen, and no air breaks Treatment Details Compression Rate Down: 1.0 psi / minute De-Compression Rate Up: 2.5 psi / minute Air breaks and breathing Decompress Decompress Compress Tx Pressure Begins Reached periods Begins Ends (leave unused spaces blank) Chamber Pressure (ATA 1 2 ------2 1 ) Clock Time (24 hr) 14:58 15:12 - - - - - - 16:42 16:50 Treatment Length: 112 (minutes) Treatment Segments: 4 Vital Signs Capillary Blood Glucose Reference Range: 80 - 120 mg / dl HBO Diabetic Blood Glucose Intervention Range: <131 mg/dl or >249 mg/dl Time Vitals Blood Respiratory Capillary Blood Glucose Pulse Action Type: Pulse: Temperature: Taken: Pressure: Rate: Glucose (mg/dl): Meter #: Oximetry (%) Taken: Pre 14:47 112/68 73 16 97.9 Post 16:52 130/69 65 14 98.1 Treatment Response Treatment Toleration: Well Treatment Completion Status: Treatment Completed without Adverse Event Electronic Signature(s) Signed: 07/03/2019 1:39:37 PM By: Worthy Keeler PA-C Signed: 07/04/2019 8:50:29 AM By: [name] Entered By: [name] on 07/02/2019  16:54:58 -------------------------------------------------------------------------------- HBO Safety Checklist Details Patient Name: Date of Service: [name], [name] 07/02/2019 3:00 PM Medical Record Number: [id number] Patient Account Number: [id number] Date of Birth/Sex: Treating RN: [birth year] (86 y.o. [name] Primary Care Virgen Belland: PA [name], [name] Other Clinician: [name] Referring Xitlaly Ault: Treating Taji Barretto/Extender: [name] in Treatment: 5 HBO Safety Checklist Items Safety Checklist Consent Form Signed Patient voided / foley secured and emptied When did you last eato N/A Last dose of injectable or oral agent Ostomy pouch emptied and vented if applicable NA All implantable devices assessed, documented and approved NA Intravenous access site secured and place NA Valuables secured Linens and cotton and cotton/polyester blend (less than 51% polyester) Personal oil-based products / skin lotions / body lotions removed Wigs or hairpieces removed Smoking or tobacco materials removed NA Books / newspapers / magazines / loose paper removed Cologne, aftershave, perfume and deodorant removed Jewelry removed (may wrap wedding band) Make-up removed Hair care products removed Battery operated devices (external) removed NA Heating patches and chemical warmers removed NA Titanium eyewear removed NA Nail polish cured greater than 10 hours NA Casting material cured greater than 10 hours NA Hearing aids removed NA Loose dentures or partials removed NA Prosthetics have been removed NA Patient demonstrates correct use of air break device (if applicable) Patient concerns have been addressed Patient grounding bracelet on and cord attached to chamber Specifics for Inpatients (complete in addition to above) Medication sheet sent with patient Intravenous medications needed or due during therapy sent with patient Drainage tubes (e.g. nasogastric  tube or chest tube secured and vented) Endotracheal or Tracheotomy tube secured Cuff deflated of air and inflated with saline Airway suctioned Electronic Signature(s) Signed: 07/04/2019 8:50:29 AM By: [name] Entered By: [name] on 07/02/2019 16:54:09

## 2019-07-04 NOTE — Progress Notes (Signed)
[name], [name] ([id number]) Visit Report for 07/01/2019 HBO Details Patient Name: Date of Service: [place], [name] 07/01/2019 1:00 PM Medical Record Number: [id number] Patient Account Number: [id number] Date of Birth/Sex: Treating RN: [birth year] (86 y.o. 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 HBO Treatment Details Treatment Number: 1 Patient Type: Outpatient Chamber Type: Monoplace Chamber Serial #: [id number] Treatment Protocol: 2.0 ATA with 90 minutes oxygen, and no air breaks Treatment Details Compression Rate Down: 1.0 psi / minute De-Compression Rate Up: 2.0 psi / minute Air breaks and breathing Decompress Decompress Compress Tx Pressure Begins Reached periods Begins Ends (leave unused spaces blank) Chamber Pressure (ATA 1 2 ------2 1 ) Clock Time (24 hr) 13:16 13:28 - - - - - - 14:58 15:03 Treatment Length: 107 (minutes) Treatment Segments: 4 Vital Signs Capillary Blood Glucose Reference Range: 80 - 120 mg / dl HBO Diabetic Blood Glucose Intervention Range: <131 mg/dl or >249 mg/dl Time Vitals Blood Respiratory Capillary Blood Glucose Pulse Action Type: Pulse: Temperature: Taken: Pressure: Rate: Glucose (mg/dl): Meter #: Oximetry (%) Taken: Pre 12:53 112/62 78 15 98.2 Post 15:05 124/62 58 16 97.8 Treatment Response Treatment Toleration: Well Treatment Completion Status: Treatment Completed without Adverse Event [name] Notes No concerns with first treatment given. Tympanic membranes were normal. Respiratory examination clear. Cardiac exam showed the previously noted systolic ejection murmur compatible with  her known aortic stenosis but no signs of congestive heart failure. Patient was also seen for wound care evaluation. Physician HBO Attestation: I certify that I supervised this HBO treatment in accordance with Medicare guidelines. A trained emergency response team is readily available per Yes hospital policies and procedures. Continue HBOT as ordered. Yes Electronic Signature(s) Signed: 07/01/2019 5:55:30 PM By: Sara Ham MD Entered By: [name] on 07/01/2019 17:53:04 -------------------------------------------------------------------------------- HBO Safety Checklist Details Patient Name: Date of Service: [facility], [name] 07/01/2019 1:00 PM Medical Record Number: [id number] Patient Account Number: [id number] Date of Birth/Sex: Treating RN: 1929-09-19 (86 y.o. F) Primary Care [name]: PA [name], NO Other Clinician: Minerva [name] Referring [name]: Treating [name]/Extender: [name] in Treatment: 5 HBO Safety Checklist Items Safety Checklist Consent Form Signed Patient voided / foley secured and emptied When did you last eato N/A Last dose of injectable or oral agent Ostomy pouch emptied and vented if applicable NA All implantable devices assessed, documented and approved NA Intravenous access site secured and place NA Valuables secured Linens and cotton and cotton/polyester blend (less than 51% polyester) Personal oil-based products / skin lotions / body lotions removed Wigs or hairpieces removed Smoking or tobacco materials removed NA Books / newspapers / magazines / loose paper removed Cologne, aftershave, perfume and deodorant removed Jewelry removed (may wrap wedding band) Make-up removed Hair care products removed Battery operated devices (external) removed NA Heating patches and chemical warmers removed NA Titanium eyewear removed NA Nail polish cured greater than 10 hours NA Casting material cured greater than 10  hours NA Hearing aids removed NA Loose dentures or partials removed NA Prosthetics have been removed NA Patient demonstrates correct use of air break device (if applicable) Patient concerns have been addressed Patient grounding bracelet on and cord attached to chamber Specifics for Inpatients (complete in addition to above) Medication  sheet sent with patient Intravenous medications needed or due during therapy sent with patient Drainage tubes (e.g. nasogastric tube or chest tube secured and vented) Endotracheal or Tracheotomy tube secured Cuff deflated of air and inflated with saline Airway suctioned Electronic Signature(s) Signed: 07/04/2019 8:50:29 AM By: [name] Entered By: [name] on 07/01/2019 13:09:03

## 2019-07-07 ENCOUNTER — Encounter (HOSPITAL_BASED_OUTPATIENT_CLINIC_OR_DEPARTMENT_OTHER): Payer: Medicare Other | Admitting: Internal Medicine

## 2019-07-07 DIAGNOSIS — L598 Other specified disorders of the skin and subcutaneous tissue related to radiation: Secondary | ICD-10-CM | POA: Diagnosis not present

## 2019-07-07 NOTE — Progress Notes (Signed)
[name], [name] ([id number]) Visit Report for 07/07/2019 SuperBill Details Patient Name: Date of Service: [place], [name] 07/07/2019 Medical Record Number: [id number] Patient Account Number: [id number] Date of Birth/Sex: Treating RN: 1929-02-26 (86 y.o. 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

## 2019-07-07 NOTE — Progress Notes (Signed)
[name], [name] ([id number]) Visit Report for 07/07/2019 Arrival Information Details Patient Name: Date of Service: [place], [name] 07/07/2019 3:00 PM Medical Record Number: [id number] Patient Account Number: [id number] Date of Birth/Sex: [name]: 12/16/1929 (86 y.o. [name] Primary Care [name]: Sara [name], NO Other Clinician: Minerva [name] Referring [place] Sorn: Sara [name]/Extender: [name] in Treatment: 5 Visit Information History Since Last Visit Added or deleted any medications: No Patient Arrived: [name] Any new allergies or adverse reactions: No Arrival Time: 14:40 Had a fall or experienced change in No Accompanied By: self activities of daily living that may affect Transfer Assistance: None risk of falls: Patient Identification Verified: Yes Signs or symptoms of abuse/neglect since last visito No Secondary Verification Process Completed: Yes Hospitalized since last visit: No Patient Requires Transmission-Based Precautions: No Implantable device outside of the clinic excluding No Patient Has Alerts: No cellular tissue based products placed in the center since last visit: Pain Present Now: No Electronic Signature(s) Signed: 07/07/2019 5:11:39 PM By: [name] Entered By: [name] on 07/07/2019 15:55:49 -------------------------------------------------------------------------------- Encounter Discharge Information Details Patient Name: Date of Service: [name], [name] A. 07/07/2019 3:00 PM Medical Record Number: [id number] Patient Account Number: [id number] Date of Birth/Sex: [name]: 02-23-1929 (86 y.o. [name] Primary Care [name]: Sara [name] Other Clinician: Minerva [name] Referring Camron Essman: Sara Lively Haberman/Extender: [name] in Treatment: 5 Encounter Discharge Information Items Discharge Condition: Stable Ambulatory Status: Walker Discharge Destination: Home Transportation:  Private Auto Accompanied By: self Schedule Follow-up Appointment: Yes Clinical Summary of Care: Patient Declined Electronic Signature(s) Signed: 07/07/2019 5:11:39 PM By: [name] Entered By: [name] on 07/07/2019 17:01:49 -------------------------------------------------------------------------------- Patient/Caregiver Education Details Patient Name: Date of Service: [name], [name] [name] 6/21/2021andnbsp3:00 PM Medical Record Number: [id number] Patient Account Number: [id number] Date of Birth/Gender: [name]: [birth year] (86 y.o. [name] Primary Care Physician: Sara [name], [name] Other Clinician: Minerva [name] Referring Physician: Treating Physician/Extender: [name] in Treatment: 5 Education Assessment Education Provided To: Patient Education Topics Provided Hyperbaric Oxygenation: Methods: Explain/Verbal Responses: State content correctly Electronic Signature(s) Signed: 07/07/2019 5:11:39 PM By: [name] Entered By: [name] on 07/07/2019 17:01:29 -------------------------------------------------------------------------------- Vitals Details Patient Name: Date of Service: [name], [name] A. 07/07/2019 3:00 PM Medical Record Number: [id number] Patient Account Number: [id number] Date of Birth/Sex: [name]: 02-11-1929 (86 y.o. [name] Primary Care [name]: Sara [name], NO Other Clinician: Minerva [name] Referring [name]: Sara [name]/Extender: [name] in Treatment: 5 Vital Signs Time Taken: 14:43 Temperature (F): 98.1 Height (in): 65 Pulse (bpm): 70 Weight (lbs): 138 Respiratory Rate (breaths/min): 16 Body Mass Index (BMI): 23 Blood Pressure (mmHg): 104/65 Reference Range: 80 - 120 mg / dl Electronic Signature(s) Signed: 07/07/2019 5:11:39 PM By: [name] Entered By: [name] on 07/07/2019 15:56:09

## 2019-07-07 NOTE — Progress Notes (Signed)
LANDREY, MAHURIN ([id number]) Visit Report for 07/04/2019 Arrival Information Details Patient Name: Date of Service: [place], [name] 07/04/2019 3:00 PM Medical Record Number: [id number] Patient Account Number: [id number] Date of Birth/Sex: Treating RN: Nov 30, 1929 (86 y.o. Hollie Salk, Larene [place] Primary Care Aizah Gehlhausen: PA [name], [name] Other Clinician: [name] Referring Jaidyn Usery: Treating Juel Ripley/Extender: [name] in Treatment: 5 Visit Information History Since Last Visit Added or deleted any medications: No Patient Arrived: [name] Any new allergies or adverse reactions: No Arrival Time: 15:02 Had a fall or experienced change in No Accompanied By: self activities of daily living that may affect Transfer Assistance: None risk of falls: Patient Identification Verified: Yes Signs or symptoms of abuse/neglect since last visito No Secondary Verification Process Completed: Yes Hospitalized since last visit: No Patient Requires Transmission-Based Precautions: No Implantable device outside of the clinic excluding No Patient Has Alerts: No cellular tissue based products placed in the center since last visit: Pain Present Now: No Electronic Signature(s) Signed: 07/07/2019 5:11:39 PM By: [name] Entered By: [name] on 07/04/2019 15:02:48 -------------------------------------------------------------------------------- Encounter Discharge Information Details Patient Name: Date of Service: [name], [name] 07/04/2019 3:00 PM Medical Record Number: [id number] Patient Account Number: [id number] Date of Birth/Sex: Treating RN: 1929/10/18 (86 y.o. [name] Primary Care Kimbree Casanas: PA [name], [name] Other Clinician: Mikeal Hawthorne Referring Ronique Simerly: Treating Pawan Knechtel/Extender: [name] in Treatment: 5 Encounter Discharge Information Items Discharge Condition: Stable Ambulatory Status: Walker Discharge Destination:  Home Transportation: Private Auto Accompanied By: self Schedule Follow-up Appointment: Yes Clinical Summary of Care: Patient Declined Electronic Signature(s) Signed: 07/04/2019 5:17:26 PM By: Mikeal Hawthorne EMT/HBOT/SD Entered By: Mikeal Hawthorne on 07/04/2019 17:16:43 -------------------------------------------------------------------------------- Patient/Caregiver Education Details Patient Name: Date of Service: [name], [name] 6/18/2021andnbsp3:00 PM Medical Record Number: [id number] Patient Account Number: [id number] Date of Birth/Gender: Treating RN: 31-May-1929 (86 y.o. [name] Primary Care Physician: PA [name], [name] Other Clinician: Mikeal Hawthorne Referring Physician: Treating Physician/Extender: [name] in Treatment: 5 Education Assessment Education Provided To: Patient Education Topics Provided Hyperbaric Oxygenation: Methods: Explain/Verbal Responses: State content correctly Electronic Signature(s) Signed: 07/04/2019 5:17:26 PM By: Mikeal Hawthorne EMT/HBOT/SD Entered By: Mikeal Hawthorne on 07/04/2019 17:16:28 -------------------------------------------------------------------------------- Vitals Details Patient Name: Date of Service: [name], [name] 07/04/2019 3:00 PM Medical Record Number: [id number] Patient Account Number: [id number] Date of Birth/Sex: Treating RN: 05/31/1929 (86 y.o. [name] Primary Care Latrish Mogel: PA [name], [name] Other Clinician: [name] Referring Abrie Egloff: Treating Aleisa Howk/Extender: [name] in Treatment: 5 Vital Signs Time Taken: 14:57 Temperature (F): 98.2 Height (in): 65 Pulse (bpm): 66 Weight (lbs): 138 Respiratory Rate (breaths/min): 14 Body Mass Index (BMI): 23 Blood Pressure (mmHg): 101/59 Reference Range: 80 - 120 mg / dl Electronic Signature(s) Signed: 07/07/2019 5:11:39 PM By: [name] Entered By: [name] on 07/04/2019 15:03:15

## 2019-07-07 NOTE — Progress Notes (Signed)
RILIE, GLANZ ([id number]) Visit Report for 07/07/2019 HBO Details Patient Name: Date of Service: [place], [name] 07/07/2019 3:00 PM Medical Record Number: [id number] Patient Account Number: [id number] Date of Birth/Sex: Treating RN: August 28, 1929 (86 y.o. 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 HBO Treatment Details Treatment Number: 5 Patient Type: Outpatient Chamber Type: Monoplace Chamber Serial #: [id number] Treatment Protocol: 2.0 ATA with 90 minutes oxygen, and no air breaks Treatment Details Compression Rate Down: 1.0 psi / minute De-Compression Rate Up: 2.0 psi / minute Air breaks and breathing Decompress Decompress Compress Tx Pressure Begins Reached periods Begins Ends (leave unused spaces blank) Chamber Pressure (ATA 1 2 ------2 1 ) Clock Time (24 hr) 15:02 15:14 - - - - - - 16:44 16:50 Treatment Length: 108 (minutes) Treatment Segments: 4 Vital Signs Capillary Blood Glucose Reference Range: 80 - 120 mg / dl HBO Diabetic Blood Glucose Intervention Range: <131 mg/dl or >249 mg/dl Time Vitals Blood Respiratory Capillary Blood Glucose Pulse Action Type: Pulse: Temperature: Taken: Pressure: Rate: Glucose (mg/dl): Meter #: Oximetry (%) Taken: Pre 14:43 104/65 70 16 98.1 Post 16:48 137/77 56 14 98 Treatment Response Treatment Toleration: Well Treatment Completion Status: Treatment Completed without Adverse Event Jameison Haji Notes No concerns with treatment given Physician HBO Attestation: I certify that I supervised this HBO treatment in accordance with Medicare guidelines. A trained emergency  response team is readily available per Yes hospital policies and procedures. Continue HBOT as ordered. Yes Electronic Signature(s) Signed: 07/07/2019 6:03:41 PM By: Linton Ham MD Previous Signature: 07/07/2019 5:11:39 PM Version By: [name] Entered By: Linton Ham on 07/07/2019 18:01:30 -------------------------------------------------------------------------------- HBO Safety Checklist Details Patient Name: Date of Service: [name], [name] 07/07/2019 3:00 PM Medical Record Number: [id number] Patient Account Number: [id number] Date of Birth/Sex: Treating RN: March 10, 1929 (86 y.o. [name] Primary Care Severino Paolo: PA [name], [place] Other Clinician: [name] Referring Trishna Cwik: Treating Azlynn Mitnick/Extender: [name] in Treatment: 5 HBO Safety Checklist Items Safety Checklist Consent Form Signed Patient voided / foley secured and emptied When did you last eato N/A Last dose of injectable or oral agent Ostomy pouch emptied and vented if applicable NA All implantable devices assessed, documented and approved NA Intravenous access site secured and place NA Valuables secured Linens and cotton and cotton/polyester blend (less than 51% polyester) Personal oil-based products / skin lotions / body lotions removed Wigs or hairpieces removed Smoking or tobacco materials removed NA Books / newspapers / magazines / loose paper removed Cologne, aftershave, perfume and deodorant removed Jewelry removed (may wrap wedding band) Make-up removed Hair care products removed Battery operated devices (external) removed NA Heating patches and chemical warmers removed NA Titanium eyewear removed NA Nail polish cured greater than 10 hours NA Casting material cured greater than 10 hours NA Hearing aids removed NA Loose dentures or partials removed NA Prosthetics have been removed NA Patient demonstrates correct use of air break device (if  applicable) Patient concerns have been addressed Patient grounding bracelet on and cord attached to chamber Specifics for Inpatients (complete in addition to above) Medication sheet sent with patient Intravenous medications needed or due during therapy sent with patient Drainage tubes (e.g. nasogastric tube or chest tube secured and vented)  Endotracheal or Tracheotomy tube secured Cuff deflated of air and inflated with saline Airway suctioned Electronic Signature(s) Signed: 07/07/2019 5:11:39 PM By: [name] Entered By: [name] on 07/07/2019 15:56:55

## 2019-07-07 NOTE — Progress Notes (Signed)
[name], [name] ([id number]) Visit Report for 07/04/2019 HBO Details Patient Name: Date of Service: [name], [name] 07/04/2019 3:00 PM Medical Record Number: [id number] Patient Account Number: [id number] Date of Birth/Sex: Treating RN: Feb 11, 1929 (86 y.o. 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 HBO Treatment Details Treatment Number: 4 Patient Type: Outpatient Chamber Type: Monoplace Chamber Serial #: [id number] Treatment Protocol: 2.0 ATA with 90 minutes oxygen, and no air breaks Treatment Details Compression Rate Down: 1.0 psi / minute De-Compression Rate Up: 2.0 psi / minute Air breaks and breathing Decompress Decompress Compress Tx Pressure Begins Reached periods Begins Ends (leave unused spaces blank) Chamber Pressure (ATA 1 2 ------2 1 ) Clock Time (24 hr) 15:12 15:27 - - - - - - 16:57 17:03 Treatment Length: 111 (minutes) Treatment Segments: 4 Vital Signs Capillary Blood Glucose Reference Range: 80 - 120 mg / dl HBO Diabetic Blood Glucose Intervention Range: <131 mg/dl or >249 mg/dl Time Vitals Blood Respiratory Capillary Blood Glucose Pulse Action Type: Pulse: Temperature: Taken: Pressure: Rate: Glucose (mg/dl): Meter #: Oximetry (%) Taken: Pre 14:57 101/59 66 14 98.2 Post 17:05 116/81 53 16 97.9 Treatment Response Treatment Toleration: Well Treatment Completion Status: Treatment Completed without Adverse Event Makyle Eslick Notes No concerns with treatment given Physician HBO Attestation: I certify that I supervised this HBO treatment in accordance with Medicare guidelines. A trained emergency  response team is readily available per Yes hospital policies and procedures. Continue HBOT as ordered. Yes Electronic Signature(s) Signed: 07/04/2019 5:24:49 PM By: Linton Ham MD Previous Signature: 07/04/2019 5:17:26 PM Version By: Mikeal Hawthorne EMT/HBOT/SD Entered By: Linton Ham on 07/04/2019 17:19:07 -------------------------------------------------------------------------------- HBO Safety Checklist Details Patient Name: Date of Service: [facility] [name] 07/04/2019 3:00 PM Medical Record Number: [id number] Patient Account Number: [id number] Date of Birth/Sex: Treating RN: 12-15-1929 (86 y.o. [name] Primary Care Tilton Marsalis: PA [name], [name] Other Clinician: [name] Referring Jabreel Chimento: Treating Korynne Dols/Extender: [name] in Treatment: 5 HBO Safety Checklist Items Safety Checklist Consent Form Signed Patient voided / foley secured and emptied When did you last eato N/A Last dose of injectable or oral agent Ostomy pouch emptied and vented if applicable NA All implantable devices assessed, documented and approved NA Intravenous access site secured and place NA Valuables secured Linens and cotton and cotton/polyester blend (less than 51% polyester) Personal oil-based products / skin lotions / body lotions removed Wigs or hairpieces removed Smoking or tobacco materials removed NA Books / newspapers / magazines / loose paper removed Cologne, aftershave, perfume and deodorant removed Jewelry removed (may wrap wedding band) Make-up removed Hair care products removed Battery operated devices (external) removed NA Heating patches and chemical warmers removed NA Titanium eyewear removed NA Nail polish cured greater than 10 hours NA Casting material cured greater than 10 hours NA Hearing aids removed NA Loose dentures or partials removed NA Prosthetics have been removed NA Patient demonstrates correct use of air break device  (if applicable) Patient concerns have been addressed Patient grounding bracelet on and cord attached to chamber Specifics for Inpatients (complete in addition to above) Medication sheet sent with patient Intravenous medications needed or due during therapy sent with patient Drainage tubes (e.g. nasogastric tube or chest tube secured and  vented) Endotracheal or Tracheotomy tube secured Cuff deflated of air and inflated with saline Airway suctioned Electronic Signature(s) Signed: 07/07/2019 5:11:39 PM By: [name] Entered By: [name] on 07/04/2019 15:04:00

## 2019-07-08 ENCOUNTER — Encounter (HOSPITAL_BASED_OUTPATIENT_CLINIC_OR_DEPARTMENT_OTHER): Payer: Medicare Other | Admitting: Internal Medicine

## 2019-07-08 DIAGNOSIS — L598 Other specified disorders of the skin and subcutaneous tissue related to radiation: Secondary | ICD-10-CM | POA: Diagnosis not present

## 2019-07-08 NOTE — Progress Notes (Signed)
JENNETTA, FLOOD ([id number]) Visit Report for 07/08/2019 HBO Details Patient Name: Date of Service: [place], [name] 07/08/2019 3:00 PM Medical Record Number: [id number] Patient Account Number: [id number] Date of Birth/Sex: Treating RN: 1929/04/21 (86 y.o. 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 HBO Treatment Details Treatment Number: 6 Patient Type: Outpatient Chamber Type: Monoplace Chamber Serial #: [id number] Treatment Protocol: 2.0 ATA with 90 minutes oxygen, and no air breaks Treatment Details Compression Rate Down: 1.0 psi / minute De-Compression Rate Up: 2.0 psi / minute Air breaks and breathing Decompress Decompress Compress Tx Pressure Begins Reached periods Begins Ends (leave unused spaces blank) Chamber Pressure (ATA 1 2 ------2 1 ) Clock Time (24 hr) 15:01 15:09 - - - - - - 16:39 16:48 Treatment Length: 107 (minutes) Treatment Segments: 4 Vital Signs Capillary Blood Glucose Reference Range: 80 - 120 mg / dl HBO Diabetic Blood Glucose Intervention Range: <131 mg/dl or >249 mg/dl Time Vitals Blood Respiratory Capillary Blood Glucose Pulse Action Type: Pulse: Temperature: Taken: Pressure: Rate: Glucose (mg/dl): Meter #: Oximetry (%) Taken: Pre 14:55 111/63 68 16 97.8 Post 16:50 130/76 54 14 97.9 Treatment Response Treatment Toleration: Well Treatment Completion Status: Treatment Completed without Adverse Event Aarya Quebedeaux Notes No concerns with treatment given Physician HBO Attestation: I certify that I supervised this HBO treatment in accordance with Medicare guidelines. A trained emergency  response team is readily available per Yes hospital policies and procedures. Continue HBOT as ordered. Yes Electronic Signature(s) Signed: 07/08/2019 5:20:04 PM By: Linton Ham MD Entered By: Linton Ham on 07/08/2019 17:15:40 -------------------------------------------------------------------------------- HBO Safety Checklist Details Patient Name: Date of Service: [facility], [name] 07/08/2019 3:00 PM Medical Record Number: [id number] Patient Account Number: [id number] Date of Birth/Sex: Treating RN: 1929/11/09 (86 y.o. [name] Primary Care Celie Desrochers: PA [name], [name] Other Clinician: [name] Referring Ostin Mathey: Treating Jacob Cicero/Extender: [name] in Treatment: 6 HBO Safety Checklist Items Safety Checklist Consent Form Signed Patient voided / foley secured and emptied When did you last eato n/a Last dose of injectable or oral agent Ostomy pouch emptied and vented if applicable NA All implantable devices assessed, documented and approved NA Intravenous access site secured and place NA Valuables secured Linens and cotton and cotton/polyester blend (less than 51% polyester) Personal oil-based products / skin lotions / body lotions removed Wigs or hairpieces removed Smoking or tobacco materials removed NA Books / newspapers / magazines / loose paper removed Cologne, aftershave, perfume and deodorant removed Jewelry removed (may wrap wedding band) Make-up removed Hair care products removed Battery operated devices (external) removed NA Heating patches and chemical warmers removed NA Titanium eyewear removed NA Nail polish cured greater than 10 hours NA Casting material cured greater than 10 hours NA Hearing aids removed NA Loose dentures or partials removed NA Prosthetics have been removed NA Patient demonstrates correct use of air break device (if applicable) Patient concerns have been addressed Patient grounding bracelet on and  cord attached to chamber Specifics for Inpatients (complete in addition to above) Medication sheet sent with patient Intravenous medications needed or due during therapy sent with patient Drainage tubes (e.g. nasogastric tube or chest tube secured and vented) Endotracheal or Tracheotomy tube secured Cuff deflated of air  and inflated with saline Airway suctioned Electronic Signature(s) Signed: 07/08/2019 5:21:45 PM By: [name] Entered By: [name] on 07/08/2019 15:35:38

## 2019-07-08 NOTE — Progress Notes (Signed)
[name], PROKOP ([id number]) Visit Report for 07/08/2019 Arrival Information Details Patient Name: Date of Service: [place], [name] 07/08/2019 3:00 PM Medical Record Number: [id number] Patient Account Number: [id number] Date of Birth/Sex: Treating RN: [birth year] (86 y.o. [name] Primary Care Gabreille Dardis: PA [name], [name] Other Clinician: Referring Telicia Hodgkiss: Treating Olene Godfrey/Extender: [name] in Treatment: 6 Visit Information History Since Last Visit Added or deleted any medications: No Patient Arrived: [name] Any new allergies or adverse reactions: No Arrival Time: 14:53 Had a fall or experienced change in No Accompanied By: self activities of daily living that may affect Transfer Assistance: None risk of falls: Patient Identification Verified: Yes Signs or symptoms of abuse/neglect since last visito No Secondary Verification Process Completed: Yes Hospitalized since last visit: No Patient Requires Transmission-Based Precautions: No Implantable device outside of the clinic excluding No Patient Has Alerts: No cellular tissue based products placed in the center since last visit: Pain Present Now: No Electronic Signature(s) Signed: 07/08/2019 5:21:45 PM By: [name] Entered By: [name] on 07/08/2019 15:34:28 -------------------------------------------------------------------------------- Encounter Discharge Information Details Patient Name: Date of Service: Sara RER, [name] 07/08/2019 3:00 PM Medical Record Number: [id number] Patient Account Number: [id number] Date of Birth/Sex: Treating RN: 06-28-1929 (86 y.o. [name] Primary Care [name]: PA [name], [name] Other Clinician: [name] Referring Terina Mcelhinny: Treating Janetta Vandoren/Extender: [name] in Treatment: 6 Encounter Discharge Information Items Discharge Condition: Stable Ambulatory Status: Walker Discharge Destination: Home Transportation: Private  Auto Accompanied By: self Schedule Follow-up Appointment: Yes Clinical Summary of Care: Patient Declined Electronic Signature(s) Signed: 07/08/2019 5:21:45 PM By: [name] Entered By: [name] on 07/08/2019 17:04:34 -------------------------------------------------------------------------------- Patient/Caregiver Education Details Patient Name: Date of Service: Sara Benne, [name] [name] 6/22/2021andnbsp3:00 PM Medical Record Number: [id number] Patient Account Number: [id number] Date of Birth/Gender: Treating RN: 1929/10/17 (86 y.o. [name] Primary Care Physician: PA [name], [name] Other Clinician: [name] Referring Physician: Treating Physician/Extender: [name] in Treatment: 6 Education Assessment Education Provided To: Patient Education Topics Provided Hyperbaric Oxygenation: Methods: Explain/Verbal Responses: State content correctly Electronic Signature(s) Signed: 07/08/2019 5:21:45 PM By: [name] Entered By: [name] on 07/08/2019 17:04:19 -------------------------------------------------------------------------------- Vitals Details Patient Name: Date of Service: Sara Benne, [name] 07/08/2019 3:00 PM Medical Record Number: [id number] Patient Account Number: [id number] Date of Birth/Sex: Treating RN: 13-Apr-1929 (86 y.o. [name] Primary Care Khilee Hendricksen: PA [name], [name] Other Clinician: [name] Referring Frances Ambrosino: Treating Aulton Routt/Extender: [name] in Treatment: 6 Vital Signs Time Taken: 14:55 Temperature (F): 97.8 Height (in): 65 Pulse (bpm): 68 Weight (lbs): 138 Respiratory Rate (breaths/min): 16 Body Mass Index (BMI): 23 Blood Pressure (mmHg): 111/63 Reference Range: 80 - 120 mg / dl Electronic Signature(s) Signed: 07/08/2019 5:21:45 PM By: [name] Entered By: [name] on 07/08/2019 15:34:46

## 2019-07-08 NOTE — Progress Notes (Signed)
[name], [name] ([id number]) Visit Report for 07/08/2019 SuperBill Details Patient Name: Date of Service: [place], [name] 07/08/2019 Medical Record Number: [id number] Patient Account Number: [id number] Date of Birth/Sex: Treating RN: 1929-05-18 (86 y.o. 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

## 2019-07-09 ENCOUNTER — Other Ambulatory Visit: Payer: Self-pay

## 2019-07-09 ENCOUNTER — Encounter (HOSPITAL_BASED_OUTPATIENT_CLINIC_OR_DEPARTMENT_OTHER): Payer: Medicare Other | Admitting: Physician Assistant

## 2019-07-09 DIAGNOSIS — L598 Other specified disorders of the skin and subcutaneous tissue related to radiation: Secondary | ICD-10-CM | POA: Diagnosis not present

## 2019-07-09 NOTE — Progress Notes (Signed)
[name], [name] ([id number]) Visit Report for 07/09/2019 Arrival Information Details Patient Name: Date of Service: [name], Sara A. 07/09/2019 3:00 PM Medical Record Number: [id number] Patient Account Number: [id number] Date of Birth/Sex: Treating RN: 02/19/1929 (86 y.o. [name], [name] Primary Care Sherif Millspaugh: PA [name], [name] Other Clinician: [name] Referring Tyreek Clabo: Treating Bronco Mcgrory/Extender: [name] in Treatment: 6 Visit Information History Since Last Visit Added or deleted any medications: No Patient Arrived: [name] Any new allergies or adverse reactions: No Arrival Time: 14:47 Had a fall or experienced change in No Accompanied By: self activities of daily living that may affect Transfer Assistance: None risk of falls: Patient Identification Verified: Yes Signs or symptoms of abuse/neglect since last visito No Secondary Verification Process Completed: Yes Hospitalized since last visit: No Patient Requires Transmission-Based Precautions: No Implantable device outside of the clinic excluding No Patient Has Alerts: No cellular tissue based products placed in the center since last visit: Pain Present Now: No Electronic Signature(s) Signed: 07/09/2019 5:20:20 PM By: [name] Entered By: [name] on 07/09/2019 15:36:22 -------------------------------------------------------------------------------- Encounter Discharge Information Details Patient Name: Date of Service: [name], [name] 07/09/2019 3:00 PM Medical Record Number: [id number] Patient Account Number: [id number] Date of Birth/Sex: Treating RN: 1929-05-06 (86 y.o. [name] Primary Care Read Bonelli: PA [name], [name] Other Clinician: [name] Referring Lucielle Vokes: Treating Palmira Stickle/Extender: [name] in Treatment: 6 Encounter Discharge Information Items Discharge Condition: Stable Ambulatory Status: Walker Discharge Destination: Home Transportation:  Private Auto Accompanied By: self Schedule Follow-up Appointment: Yes Clinical Summary of Care: Patient Declined Electronic Signature(s) Signed: 07/09/2019 5:20:20 PM By: [name] Entered By: [name] on 07/09/2019 17:19:52 -------------------------------------------------------------------------------- Patient/Caregiver Education Details Patient Name: Date of Service: [name], [name] 6/23/2021andnbsp3:00 PM Medical Record Number: [id number] Patient Account Number: [id number] Date of Birth/Gender: Treating RN: [birth year] (86 y.o. [name] Primary Care Physician: PA [name], [name] Other Clinician: [name] Referring Physician: Treating Physician/Extender: [name] in Treatment: 6 Education Assessment Education Provided To: Patient Education Topics Provided Hyperbaric Oxygenation: Methods: Explain/Verbal Responses: State content correctly Electronic Signature(s) Signed: 07/09/2019 5:20:20 PM By: [name] Entered By: [name] on 07/09/2019 17:19:36 -------------------------------------------------------------------------------- Vitals Details Patient Name: Date of Service: [name], [name] 07/09/2019 3:00 PM Medical Record Number: [id number] Patient Account Number: [id number] Date of Birth/Sex: Treating RN: [birth year] (86 y.o. [name] Primary Care Dawne Casali: PA [name], [name] Other Clinician: [name] Referring Mccartney Chuba: Treating Elga Santy/Extender: [name] in Treatment: 6 Vital Signs Time Taken: 14:52 Temperature (F): 97.5 Height (in): 65 Pulse (bpm): 68 Weight (lbs): 138 Respiratory Rate (breaths/min): 14 Body Mass Index (BMI): 23 Blood Pressure (mmHg): 107/70 Reference Range: 80 - 120 mg / dl Electronic Signature(s) Signed: 07/09/2019 5:20:20 PM By: [name] Entered By: [name] on 07/09/2019 15:36:46

## 2019-07-10 ENCOUNTER — Encounter (HOSPITAL_BASED_OUTPATIENT_CLINIC_OR_DEPARTMENT_OTHER): Payer: Medicare Other | Admitting: Internal Medicine

## 2019-07-10 ENCOUNTER — Other Ambulatory Visit: Payer: Self-pay

## 2019-07-10 DIAGNOSIS — L598 Other specified disorders of the skin and subcutaneous tissue related to radiation: Secondary | ICD-10-CM | POA: Diagnosis not present

## 2019-07-11 ENCOUNTER — Encounter (HOSPITAL_BASED_OUTPATIENT_CLINIC_OR_DEPARTMENT_OTHER): Payer: Medicare Other | Admitting: Internal Medicine

## 2019-07-11 DIAGNOSIS — L598 Other specified disorders of the skin and subcutaneous tissue related to radiation: Secondary | ICD-10-CM | POA: Diagnosis not present

## 2019-07-11 NOTE — Progress Notes (Signed)
AZORIA, ABBETT ([id number]) Visit Report for 07/10/2019 SuperBill Details Patient Name: Date of Service: [place], [name] 07/10/2019 Medical Record Number: [id number] Patient Account Number: [id number] Date of Birth/Sex: Treating RN: 1929/05/17 (86 y.o. 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

## 2019-07-11 NOTE — Progress Notes (Signed)
[name], Sara ([id number]) Visit Report for 07/11/2019 Arrival Information Details Patient Name: Date of Service: [place], [name] 07/11/2019 3:00 PM Medical Record Number: [id number] Patient Account Number: [id number] Date of Birth/Sex: Treating RN: 07-07-1929 (86 y.o. [name] Primary Care Giacomo Valone: PA [name], [name] Other Clinician: Referring Mattia Osterman: Treating Tejah Brekke/Extender: [name] in Treatment: 6 Visit Information History Since Last Visit Added or deleted any medications: No Patient Arrived: Ambulatory Any new allergies or adverse reactions: No Arrival Time: 14:40 Had a fall or experienced change in No Accompanied By: self activities of daily living that may affect Transfer Assistance: None risk of falls: Patient Identification Verified: Yes Signs or symptoms of abuse/neglect since last visito No Secondary Verification Process Completed: Yes Hospitalized since last visit: No Patient Requires Transmission-Based Precautions: No Implantable device outside of the clinic excluding No Patient Has Alerts: No cellular tissue based products placed in the center since last visit: Pain Present Now: No Electronic Signature(s) Signed: 07/11/2019 5:12:49 PM By: [name] Entered By: [name] on 07/11/2019 16:17:55 -------------------------------------------------------------------------------- Encounter Discharge Information Details Patient Name: Date of Service: Sara RER, [name] 07/11/2019 3:00 PM Medical Record Number: [id number] Patient Account Number: [id number] Date of Birth/Sex: Treating RN: 04-24-1929 (86 y.o. [name] Primary Care Kilea Mccarey: PA [name], [name] Other Clinician: [name] Referring Lauralye Kinn: Treating Gracious Renken/Extender: [name] in Treatment: 6 Encounter Discharge Information Items Discharge Condition: Stable Ambulatory Status: Walker Discharge Destination: Home Transportation: Private  Auto Accompanied By: self Schedule Follow-up Appointment: Yes Clinical Summary of Care: Patient Declined Electronic Signature(s) Signed: 07/11/2019 5:12:49 PM By: [name] Entered By: [name] on 07/11/2019 17:12:30 -------------------------------------------------------------------------------- Patient/Caregiver Education Details Patient Name: Date of Service: Sara Benne, [name] 6/25/2021andnbsp3:00 PM Medical Record Number: [id number] Patient Account Number: [id number] Date of Birth/Gender: Treating RN: July 22, 1929 (86 y.o. [name] Primary Care Physician: PA [name], [name] Other Clinician: [name] Referring Physician: Treating Physician/Extender: [name] in Treatment: 6 Education Assessment Education Provided To: Patient Education Topics Provided Hyperbaric Oxygenation: Methods: Explain/Verbal Responses: State content correctly Electronic Signature(s) Signed: 07/11/2019 5:12:49 PM By: [name] Entered By: [name] on 07/11/2019 17:12:15 -------------------------------------------------------------------------------- Vitals Details Patient Name: Date of Service: Sara Benne, [name] 07/11/2019 3:00 PM Medical Record Number: [id number] Patient Account Number: [id number] Date of Birth/Sex: Treating RN: 05-Nov-1929 (86 y.o. [name] Primary Care Weiland Tomich: PA [name], [name] Other Clinician: [name] Referring Charmine Bockrath: Treating Wyeth Hoffer/Extender: [name] in Treatment: 6 Vital Signs Time Taken: 14:42 Temperature (F): 97.5 Height (in): 65 Pulse (bpm): 75 Weight (lbs): 138 Respiratory Rate (breaths/min): 14 Body Mass Index (BMI): 23 Blood Pressure (mmHg): 124/59 Reference Range: 80 - 120 mg / dl Electronic Signature(s) Signed: 07/11/2019 5:12:49 PM By: [name] Entered By: [name] on 07/11/2019 16:18:15

## 2019-07-11 NOTE — Progress Notes (Signed)
Sara, [name] ([id number]) Visit Report for 07/11/2019 HBO Details Patient Name: Date of Service: [place], [name] 07/11/2019 3:00 PM Medical Record Number: [id number] Patient Account Number: [id number] Date of Birth/Sex: Treating RN: Aug 16, 1929 (86 y.o. 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 HBO Treatment Details Treatment Number: 9 Patient Type: Outpatient Chamber Type: Monoplace Chamber Serial #: [id number] Treatment Protocol: 2.0 ATA with 90 minutes oxygen, and no air breaks Treatment Details Compression Rate Down: 1.0 psi / minute De-Compression Rate Up: 2.0 psi / minute Air breaks and breathing Decompress Decompress Compress Tx Pressure Begins Reached periods Begins Ends (leave unused spaces blank) Chamber Pressure (ATA 1 2 ------2 1 ) Clock Time (24 hr) 15:01 15:16 - - - - - - 16:46 16:52 Treatment Length: 111 (minutes) Treatment Segments: 4 Vital Signs Capillary Blood Glucose Reference Range: 80 - 120 mg / dl HBO Diabetic Blood Glucose Intervention Range: <131 mg/dl or >249 mg/dl Time Vitals Blood Respiratory Capillary Blood Glucose Pulse Action Type: Pulse: Temperature: Taken: Pressure: Rate: Glucose (mg/dl): Meter #: Oximetry (%) Taken: Pre 14:42 124/59 75 14 97.5 Post 16:55 119/72 56 12 97.8 Treatment Response Treatment Toleration: Well Treatment Completion Status: Treatment Completed without Adverse Event [name] Notes No concerns with treatment given Physician HBO Attestation: I certify that I supervised this HBO treatment in accordance with Medicare guidelines. A trained emergency response team  is readily available per Yes hospital policies and procedures. Continue HBOT as ordered. Yes Electronic Signature(s) Signed: 07/11/2019 5:27:17 PM By: Linton Ham MD Previous Signature: 07/11/2019 5:12:49 PM Version By: [name] Entered By: Linton Ham on 07/11/2019 17:23:45 -------------------------------------------------------------------------------- HBO Safety Checklist Details Patient Name: Date of Service: [name] 07/11/2019 3:00 PM Medical Record Number: [id number] Patient Account Number: [id number] Date of Birth/Sex: Treating RN: 1929-12-23 (86 y.o. [name] Primary Care Pinchos Topel: PA [name], [place] Other Clinician: Referring Afifa Truax: Treating Kawana Hegel/Extender: [name] in Treatment: 6 HBO Safety Checklist Items Safety Checklist Consent Form Signed Patient voided / foley secured and emptied When did you last eato N/A Last dose of injectable or oral agent Ostomy pouch emptied and vented if applicable NA All implantable devices assessed, documented and approved NA Intravenous access site secured and place NA Valuables secured Linens and cotton and cotton/polyester blend (less than 51% polyester) Personal oil-based products / skin lotions / body lotions removed Wigs or hairpieces removed Smoking or tobacco materials removed NA Books / newspapers / magazines / loose paper removed Cologne, aftershave, perfume and deodorant removed Jewelry removed (may wrap wedding band) Make-up removed Hair care products removed Battery operated devices (external) removed NA Heating patches and chemical warmers removed NA Titanium eyewear removed NA Nail polish cured greater than 10 hours NA Casting material cured greater than 10 hours NA Hearing aids removed NA Loose dentures or partials removed NA Prosthetics have been removed NA Patient demonstrates correct use of air break device (if applicable) Patient concerns have been  addressed Patient grounding bracelet on and cord attached to chamber Specifics for Inpatients (complete in addition to above) Medication sheet sent with patient Intravenous medications needed or due during therapy sent with patient Drainage tubes (e.g. nasogastric tube or chest tube secured and vented) Endotracheal or Tracheotomy tube  secured Cuff deflated of air and inflated with saline Airway suctioned Electronic Signature(s) Signed: 07/11/2019 5:12:49 PM By: [name] Entered By: [name] on 07/11/2019 16:23:31

## 2019-07-11 NOTE — Progress Notes (Signed)
[name], OST ([id number]) Visit Report for 07/09/2019 HBO Details Patient Name: Date of Service: [name], [name] 07/09/2019 3:00 PM Medical Record Number: [id number] Patient Account Number: [id number] Date of Birth/Sex: Treating RN: Dec 15, 1929 (86 y.o. 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 HBO Treatment Details Treatment Number: 7 Patient Type: Outpatient Chamber Type: Monoplace Chamber Serial #: [id number] Treatment Protocol: 2.0 ATA with 90 minutes oxygen, and no air breaks Treatment Details Compression Rate Down: 1.0 psi / minute De-Compression Rate Up: 2.0 psi / minute Air breaks and breathing Decompress Decompress Compress Tx Pressure Begins Reached periods Begins Ends (leave unused spaces blank) Chamber Pressure (ATA 1 2 ------2 1 ) Clock Time (24 hr) 15:04 15:19 - - - - - - 16:49 16:57 Treatment Length: 113 (minutes) Treatment Segments: 4 Vital Signs Capillary Blood Glucose Reference Range: 80 - 120 mg / dl HBO Diabetic Blood Glucose Intervention Range: <131 mg/dl or >249 mg/dl Time Vitals Blood Respiratory Capillary Blood Glucose Pulse Action Type: Pulse: Temperature: Taken: Pressure: Rate: Glucose (mg/dl): Meter #: Oximetry (%) Taken: Pre 14:52 107/70 68 14 97.5 Post 17:00 125/74 55 16 97.7 Treatment Response Treatment Toleration: Well Treatment Completion Status: Treatment Completed without Adverse Event Electronic Signature(s) Signed: 07/09/2019 5:20:20 PM By: [name] Signed: 07/11/2019 3:47:20 PM By: [name] Entered By: [name] on 07/09/2019  17:19:22 -------------------------------------------------------------------------------- HBO Safety Checklist Details Patient Name: Date of Service: Sara Benne, [name] 07/09/2019 3:00 PM Medical Record Number: [id number] Patient Account Number: [id number] Date of Birth/Sex: Treating RN: [birth year] (86 y.o. [name] Primary Care [name]: PA [name], [name] Other Clinician: Minerva [name] Referring [name]: Treating [name]/Extender: [name] in Treatment: 6 HBO Safety Checklist Items Safety Checklist Consent Form Signed Patient voided / foley secured and emptied When did you last eato N/A Last dose of injectable or oral agent Ostomy pouch emptied and vented if applicable NA All implantable devices assessed, documented and approved NA Intravenous access site secured and place NA Valuables secured Linens and cotton and cotton/polyester blend (less than 51% polyester) Personal oil-based products / skin lotions / body lotions removed Wigs or hairpieces removed Smoking or tobacco materials removed NA Books / newspapers / magazines / loose paper removed Cologne, aftershave, perfume and deodorant removed Jewelry removed (may wrap wedding band) Make-up removed Hair care products removed Battery operated devices (external) removed NA Heating patches and chemical warmers removed NA Titanium eyewear removed NA Nail polish cured greater than 10 hours NA Casting material cured greater than 10 hours NA Hearing aids removed NA Loose dentures or partials removed NA Prosthetics have been removed NA Patient demonstrates correct use of air break device (if applicable) Patient concerns have been addressed Patient grounding bracelet on and cord attached to chamber Specifics for Inpatients (complete in addition to above) Medication sheet sent with patient Intravenous medications needed or due during therapy sent with patient Drainage tubes (e.g. nasogastric  tube or chest tube secured and vented) Endotracheal or Tracheotomy tube secured Cuff deflated of air and inflated with saline Airway suctioned Electronic Signature(s) Signed: 07/09/2019 5:20:20 PM By: [name] Entered By: [name] on 07/09/2019 15:37:46

## 2019-07-11 NOTE — Progress Notes (Signed)
[name], [name] ([id number]) Visit Report for 07/10/2019 HBO Details Patient Name: Date of Service: [place], [name] 07/10/2019 3:00 PM Medical Record Number: [id number] Patient Account Number: [id number] Date of Birth/Sex: Treating RN: 1929/07/25 (86 y.o. 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 HBO Treatment Details Treatment Number: 8 Patient Type: Outpatient Chamber Type: Monoplace Chamber Serial #: [id number] Treatment Protocol: 2.0 ATA with 90 minutes oxygen, and no air breaks Treatment Details Compression Rate Down: 1.0 psi / minute De-Compression Rate Up: 2.0 psi / minute Air breaks and breathing Decompress Decompress Compress Tx Pressure Begins Reached periods Begins Ends (leave unused spaces blank) Chamber Pressure (ATA 1 2 ------2 1 ) Clock Time (24 hr) 14:52 15:07 - - - - - - 16:37 16:43 Treatment Length: 111 (minutes) Treatment Segments: 4 Vital Signs Capillary Blood Glucose Reference Range: 80 - 120 mg / dl HBO Diabetic Blood Glucose Intervention Range: <131 mg/dl or >249 mg/dl Time Vitals Blood Respiratory Capillary Blood Glucose Pulse Action Type: Pulse: Temperature: Taken: Pressure: Rate: Glucose (mg/dl): Meter #: Oximetry (%) Taken: Pre 14:45 94/64 70 14 98.1 Post 16:45 128/75 58 12 98 Treatment Response Treatment Toleration: Well Treatment Completion Status: Treatment Completed without Adverse Event [name] Notes No concerns with treatment given. Patient [name] was E scribed as requested Physician HBO Attestation: I certify that I supervised this HBO treatment in accordance with  Medicare guidelines. A trained emergency response team is readily available per Yes hospital policies and procedures. Continue HBOT as ordered. Yes Electronic Signature(s) Signed: 07/11/2019 5:12:49 PM By: [name] Signed: 07/11/2019 5:27:17 PM By: Linton Ham MD Entered By: [name] on 07/10/2019 17:25:12 -------------------------------------------------------------------------------- HBO Safety Checklist Details Patient Name: Date of Service: [name], [name] 07/10/2019 3:00 PM Medical Record Number: [id number] Patient Account Number: [id number] Date of Birth/Sex: Treating RN: 1929-07-23 (86 y.o. [name], Meta.Reding Primary Care Talah Cookston: PA [name], [name] Other Clinician: [name] Referring Jimie Kuwahara: Treating Jezreel Sisk/Extender: [name] in Treatment: 6 HBO Safety Checklist Items Safety Checklist Consent Form Signed Patient voided / foley secured and emptied When did you last eato n/A Last dose of injectable or oral agent Ostomy pouch emptied and vented if applicable NA All implantable devices assessed, documented and approved NA Intravenous access site secured and place NA Valuables secured Linens and cotton and cotton/polyester blend (less than 51% polyester) Personal oil-based products / skin lotions / body lotions removed Wigs or hairpieces removed Smoking or tobacco materials removed NA Books / newspapers / magazines / loose paper removed Cologne, aftershave, perfume and deodorant removed Jewelry removed (may wrap wedding band) Make-up removed Hair care products removed Battery operated devices (external) removed NA Heating patches and chemical warmers removed NA Titanium eyewear removed NA Nail polish cured greater than 10 hours NA Casting material cured greater than 10 hours NA Hearing aids removed NA Loose dentures or partials removed NA Prosthetics have been removed NA Patient demonstrates correct use of air break device  (if applicable) Patient concerns have been addressed Patient grounding bracelet on and cord attached to chamber Specifics for Inpatients (complete in addition to above) Medication sheet sent with patient Intravenous medications needed or due during therapy sent with patient Drainage tubes (e.g. nasogastric tube or  chest tube secured and vented) Endotracheal or Tracheotomy tube secured Cuff deflated of air and inflated with saline Airway suctioned Electronic Signature(s) Signed: 07/11/2019 5:12:49 PM By: [name] Entered By: [name] on 07/10/2019 15:53:23

## 2019-07-11 NOTE — Progress Notes (Signed)
[name], [name] ([id number]) Visit Report for 07/11/2019 SuperBill Details Patient Name: Date of Service: [place], [name] 07/11/2019 Medical Record Number: [id number] Patient Account Number: [id number] Date of Birth/Sex: Treating RN: 01/07/1930 (86 y.o. 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

## 2019-07-11 NOTE — Progress Notes (Signed)
[name], Sara ([id number]) Visit Report for 07/09/2019 SuperBill Details Patient Name: Date of Service: [place], [name] 07/09/2019 Medical Record Number: [id number] Patient Account Number: [id number] Date of Birth/Sex: Treating RN: [birth year] (86 y.o. 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

## 2019-07-11 NOTE — Progress Notes (Signed)
[name], [name] ([id number]) Visit Report for 07/10/2019 Arrival Information Details Patient Name: Date of Service: [place], [name] 07/10/2019 3:00 PM Medical Record Number: [id number] Patient Account Number: [id number] Date of Birth/Sex: Treating RN: [birth year] (86 y.o. Helene Shoe, Meta.Reding Primary Care Draven Laine: PA [name], [name] Other Clinician: [name] Referring Declan Adamson: Treating Zia Kanner/Extender: [name] in Treatment: 6 Visit Information History Since Last Visit Added or deleted any medications: No Patient Arrived: [name] Any new allergies or adverse reactions: No Arrival Time: 14:43 Had a fall or experienced change in No Accompanied By: self activities of daily living that may affect Transfer Assistance: None risk of falls: Patient Identification Verified: Yes Signs or symptoms of abuse/neglect since last visito No Secondary Verification Process Completed: Yes Hospitalized since last visit: No Patient Requires Transmission-Based Precautions: No Implantable device outside of the clinic excluding No Patient Has Alerts: No cellular tissue based products placed in the center since last visit: Pain Present Now: No Electronic Signature(s) Signed: 07/11/2019 5:12:49 PM By: [name] Entered By: [name] on 07/10/2019 15:50:28 -------------------------------------------------------------------------------- Encounter Discharge Information Details Patient Name: Date of Service: [place] RER, [name] 07/10/2019 3:00 PM Medical Record Number: [id number] Patient Account Number: [id number] Date of Birth/Sex: Treating RN: 1929/11/25 (86 y.o. [name] Primary Care Makaelyn Aponte: PA [name], [name] Other Clinician: [name] Referring Tee Richeson: Treating Jessejames Steelman/Extender: [name] in Treatment: 6 Encounter Discharge Information Items Discharge Condition: Stable Ambulatory Status: Ambulatory Discharge Destination: Home Transportation:  Private Auto Accompanied By: self Schedule Follow-up Appointment: Yes Clinical Summary of Care: Patient Declined Electronic Signature(s) Signed: 07/11/2019 5:12:49 PM By: [name] Entered By: [name] on 07/10/2019 17:25:37 -------------------------------------------------------------------------------- Patient/Caregiver Education Details Patient Name: Date of Service: [place], [name] 6/24/2021andnbsp3:00 PM Medical Record Number: [id number] Patient Account Number: [id number] Date of Birth/Gender: Treating RN: 06-Jan-1930 (86 y.o. [name] Primary Care Physician: PA [name], [name] Other Clinician: [name] Referring Physician: Treating Physician/Extender: [name] in Treatment: 6 Education Assessment Education Provided To: Patient Education Topics Provided Hyperbaric Oxygenation: Methods: Explain/Verbal Responses: State content correctly Electronic Signature(s) Signed: 07/11/2019 5:12:49 PM By: [name] Entered By: [name] on 07/10/2019 17:25:25 -------------------------------------------------------------------------------- Vitals Details Patient Name: Date of Service: [place] Ben[place], [name] 07/10/2019 3:00 PM Medical Record Number: [id number] Patient Account Number: [id number] Date of Birth/Sex: Treating RN: 1929/05/02 (86 y.o. [name] Primary Care Verenise Moulin: PA [name], [name] Other Clinician: [name] Referring Mickenzie Stolar: Treating Katlin Bortner/Extender: [name] in Treatment: 6 Vital Signs Time Taken: 14:45 Temperature (F): 98.1 Height (in): 65 Pulse (bpm): 70 Weight (lbs): 138 Respiratory Rate (breaths/min): 14 Body Mass Index (BMI): 23 Blood Pressure (mmHg): 94/64 Reference Range: 80 - 120 mg / dl Electronic Signature(s) Signed: 07/11/2019 5:12:49 PM By: [name] Entered By: [name] on 07/10/2019 15:52:54

## 2019-07-14 ENCOUNTER — Encounter (HOSPITAL_BASED_OUTPATIENT_CLINIC_OR_DEPARTMENT_OTHER): Payer: Medicare Other | Admitting: Internal Medicine

## 2019-07-14 DIAGNOSIS — L598 Other specified disorders of the skin and subcutaneous tissue related to radiation: Secondary | ICD-10-CM | POA: Diagnosis not present

## 2019-07-14 NOTE — Progress Notes (Addendum)
[name], [name] ([id number]) Visit Report for 07/14/2019 HBO Details Patient Name: Date of Service: [name], [name] 07/14/2019 3:00 PM Medical Record Number: [id number] Patient Account Number: [id number] Date of Birth/Sex: Treating RN: 02/05/1929 (86 y.o. 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 HBO Treatment Details Treatment Number: 10 Patient Type: Outpatient Chamber Type: Monoplace Chamber Serial #: [id number] Treatment Protocol: 2.0 ATA with 90 minutes oxygen, and no air breaks Treatment Details Compression Rate Down: 1.0 psi / minute De-Compression Rate Up: 3.0 psi / minute Air breaks and breathing Decompress Decompress Compress Tx Pressure Begins Reached periods Begins Ends (leave unused spaces blank) Chamber Pressure (ATA 1 2 ------2 1 ) Clock Time (24 hr) 15:11 15:26 - - - - - - 16:56 17:02 Treatment Length: 111 (minutes) Treatment Segments: 4 Vital Signs Capillary Blood Glucose Reference Range: 80 - 120 mg / dl HBO Diabetic Blood Glucose Intervention Range: <131 mg/dl or >249 mg/dl Time Vitals Blood Respiratory Capillary Blood Glucose Pulse Action Type: Pulse: Temperature: Taken: Pressure: Rate: Glucose (mg/dl): Meter #: Oximetry (%) Taken: Pre 12:48 105/59 75 12 97.8 Post 17:04 130/66 56 14 97.9 Treatment Response Treatment Toleration: Well Treatment Completion Status: Treatment Completed without Adverse Event Leshea Jaggers Notes No concerns with treatment given Physician HBO Attestation: I certify that I supervised this HBO treatment in accordance with Medicare guidelines. A trained emergency  response team is readily available per Yes hospital policies and procedures. Continue HBOT as ordered. Yes Electronic Signature(s) Signed: 07/14/2019 5:46:39 PM By: Linton Ham MD Previous Signature: 07/14/2019 5:28:49 PM Version By: Mikeal Hawthorne EMT/HBOT/SD Entered By: Linton Ham on 07/14/2019 17:44:36 -------------------------------------------------------------------------------- HBO Safety Checklist Details Patient Name: Date of Service: [facility], [name] 07/14/2019 3:00 PM Medical Record Number: [id number] Patient Account Number: [id number] Date of Birth/Sex: Treating RN: [birth year] (86 y.o. [name] Primary Care [name]: PA [name], [place] Other Clinician: [name] Referring Nathalie Cavendish: Treating Hartleigh Edmonston/Extender: [name] in Treatment: 6 HBO Safety Checklist Items Safety Checklist Consent Form Signed Patient voided / foley secured and emptied When did you last eato N/A Last dose of injectable or oral agent Ostomy pouch emptied and vented if applicable NA All implantable devices assessed, documented and approved NA Intravenous access site secured and place NA Valuables secured Linens and cotton and cotton/polyester blend (less than 51% polyester) Personal oil-based products / skin lotions / body lotions removed Wigs or hairpieces removed Smoking or tobacco materials removed NA Books / newspapers / magazines / loose paper removed Cologne, aftershave, perfume and deodorant removed Jewelry removed (may wrap wedding band) Make-up removed Hair care products removed Battery operated devices (external) removed NA Heating patches and chemical warmers removed NA Titanium eyewear removed NA Nail polish cured greater than 10 hours NA Casting material cured greater than 10 hours NA Hearing aids removed NA Loose dentures or partials removed NA Prosthetics have been removed NA Patient demonstrates correct use of air break device (if  applicable) Patient concerns have been addressed Patient grounding bracelet on and cord attached to chamber Specifics for Inpatients (complete in addition to above) Medication sheet sent with patient Intravenous medications needed or due during therapy sent with patient Drainage tubes (e.g. nasogastric tube or chest tube secured and  vented) Endotracheal or Tracheotomy tube secured Cuff deflated of air and inflated with saline Airway suctioned Electronic Signature(s) Signed: 07/14/2019 4:28:03 PM By: [name] Entered By: [name] on 07/14/2019 16:25:28

## 2019-07-14 NOTE — Progress Notes (Addendum)
ROSALVA, NEARY ([id number]) Visit Report for 07/14/2019 Arrival Information Details Patient Name: Date of Service: [place], [name] 07/14/2019 3:00 PM Medical Record Number: [id number] Patient Account Number: [id number] Date of Birth/Sex: Treating RN: [birth year] (86 y.o. [name] Primary Care Brenn Gatton: PA [name], [place] Other Clinician: [name] Referring Elton Catalano: Treating Giovanni Bath/Extender: [name] in Treatment: 6 Visit Information History Since Last Visit Added or deleted any medications: No Patient Arrived: Ambulatory Any new allergies or adverse reactions: No Arrival Time: 12:45 Had a fall or experienced change in No Accompanied By: self activities of daily living that may affect Transfer Assistance: None risk of falls: Patient Identification Verified: Yes Signs or symptoms of abuse/neglect since last visito No Secondary Verification Process Completed: Yes Hospitalized since last visit: No Patient Requires Transmission-Based Precautions: No Implantable device outside of the clinic excluding No Patient Has Alerts: No cellular tissue based products placed in the center since last visit: Pain Present Now: No Electronic Signature(s) Signed: 07/14/2019 4:28:03 PM By: [name] Entered By: [name] on 07/14/2019 16:24:00 -------------------------------------------------------------------------------- Encounter Discharge Information Details Patient Name: Date of Service: [name], [name] 07/14/2019 3:00 PM Medical Record Number: [id number] Patient Account Number: [id number] Date of Birth/Sex: Treating RN: 03/05/1929 (86 y.o. [name] Primary Care Jamin Humphries: PA [name], [place] Other Clinician: Mikeal Hawthorne Referring Furkan Keenum: Treating Keno Caraway/Extender: [name] in Treatment: 6 Encounter Discharge Information Items Discharge Condition: Stable Ambulatory Status: [place] Discharge Destination: Home Transportation:  Private Auto Accompanied By: self Schedule Follow-up Appointment: Yes Clinical Summary of Care: Patient Declined Electronic Signature(s) Signed: 07/14/2019 5:28:49 PM By: Mikeal Hawthorne EMT/HBOT/SD Entered By: Mikeal Hawthorne on 07/14/2019 17:26:48 -------------------------------------------------------------------------------- Patient/Caregiver Education Details Patient Name: Date of Service: [name], [name] 6/28/2021andnbsp3:00 PM Medical Record Number: [id number] Patient Account Number: [id number] Date of Birth/Gender: Treating RN: May 03, 1929 (86 y.o. [name] Primary Care Physician: PA [name], [name] Other Clinician: Mikeal Hawthorne Referring Physician: Treating Physician/Extender: [name] in Treatment: 6 Education Assessment Education Provided To: Patient Education Topics Provided Hyperbaric Oxygenation: Methods: Explain/Verbal Responses: State content correctly Electronic Signature(s) Signed: 07/14/2019 5:28:49 PM By: Mikeal Hawthorne EMT/HBOT/SD Entered By: Mikeal Hawthorne on 07/14/2019 17:26:36 -------------------------------------------------------------------------------- Vitals Details Patient Name: Date of Service: [name], [name] 07/14/2019 3:00 PM Medical Record Number: [id number] Patient Account Number: [id number] Date of Birth/Sex: Treating RN: August 17, 1929 (86 y.o. [name] Primary Care Carlinda Ohlson: PA [name], [place] Other Clinician: [name] Referring Bulah Lurie: Treating Rasheda Ledger/Extender: [name] in Treatment: 6 Vital Signs Time Taken: 12:48 Temperature (F): 97.8 Height (in): 65 Pulse (bpm): 75 Weight (lbs): 138 Respiratory Rate (breaths/min): 12 Body Mass Index (BMI): 23 Blood Pressure (mmHg): 105/59 Reference Range: 80 - 120 mg / dl Electronic Signature(s) Signed: 07/14/2019 4:28:03 PM By: [name] Entered By: [name] on 07/14/2019 16:25:00

## 2019-07-14 NOTE — Progress Notes (Signed)
[name], [name] ([id number]) Visit Report for 07/14/2019 SuperBill Details Patient Name: Date of Service: [name], [name] 07/14/2019 Medical Record Number: [id number] Patient Account Number: [id number] Date of Birth/Sex: Treating RN: 1929/05/28 (86 y.o. 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

## 2019-07-15 ENCOUNTER — Encounter (HOSPITAL_BASED_OUTPATIENT_CLINIC_OR_DEPARTMENT_OTHER): Payer: Medicare Other | Admitting: Internal Medicine

## 2019-07-15 DIAGNOSIS — L598 Other specified disorders of the skin and subcutaneous tissue related to radiation: Secondary | ICD-10-CM | POA: Diagnosis not present

## 2019-07-16 ENCOUNTER — Encounter (HOSPITAL_BASED_OUTPATIENT_CLINIC_OR_DEPARTMENT_OTHER): Payer: Medicare Other | Admitting: Physician Assistant

## 2019-07-16 DIAGNOSIS — L598 Other specified disorders of the skin and subcutaneous tissue related to radiation: Secondary | ICD-10-CM | POA: Diagnosis not present

## 2019-07-16 NOTE — Progress Notes (Signed)
ATHALENE, KOLLE ([id number]) Visit Report for 07/15/2019 SuperBill Details Patient Name: Date of Service: [place], [name] 07/15/2019 Medical Record Number: [id number] Patient Account Number: [id number] Date of Birth/Sex: Treating RN: Feb 25, 1929 (86 y.o. 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

## 2019-07-16 NOTE — Progress Notes (Signed)
[name], [name] ([id number]) Visit Report for 07/15/2019 HBO Details Patient Name: Date of Service: [place], [name] 07/15/2019 3:00 PM Medical Record Number: [id number] Patient Account Number: [id number] Date of Birth/Sex: Treating RN: [birth year] (86 y.o. 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 HBO Treatment Details Treatment Number: 11 Patient Type: Outpatient Chamber Type: Monoplace Chamber Serial #: [id number] Treatment Protocol: 2.0 ATA with 90 minutes oxygen, and no air breaks Treatment Details Compression Rate Down: 1.0 psi / minute De-Compression Rate Up: 3.0 psi / minute Air breaks and breathing Decompress Decompress Compress Tx Pressure Begins Reached periods Begins Ends (leave unused spaces blank) Chamber Pressure (ATA 1 2 ------2 1 ) Clock Time (24 hr) 15:28 15:43 - - - - - - 17:13 17:19 Treatment Length: 111 (minutes) Treatment Segments: 4 Vital Signs Capillary Blood Glucose Reference Range: 80 - 120 mg / dl HBO Diabetic Blood Glucose Intervention Range: <131 mg/dl or >249 mg/dl Time Vitals Blood Respiratory Capillary Blood Glucose Pulse Action Type: Pulse: Temperature: Taken: Pressure: Rate: Glucose (mg/dl): Meter #: Oximetry (%) Taken: Pre 14:30 107/60 67 16 97.8 Post 17:21 158/89 52 14 98.2 Treatment Response Treatment Toleration: Well Treatment Completion Status: Treatment Completed without Adverse Event Hunter Pinkard Notes No concerns with treatment given. Patient was also seen for wound care evaluation Physician HBO Attestation: I certify that I supervised this HBO treatment in accordance  with Medicare guidelines. A trained emergency response team is readily available per Yes hospital policies and procedures. Continue HBOT as ordered. Yes Electronic Signature(s) Signed: 07/16/2019 3:41:00 PM By: Linton Ham MD Previous Signature: 07/15/2019 5:46:32 PM Version By: Mikeal Hawthorne EMT/HBOT/SD Entered By: Linton Ham on 07/16/2019 15:31:32 -------------------------------------------------------------------------------- HBO Safety Checklist Details Patient Name: Date of Service: [facility], [name] 07/15/2019 3:00 PM Medical Record Number: [id number] Patient Account Number: [id number] Date of Birth/Sex: Treating RN: 26-Feb-1929 (86 y.o. [name] Primary Care Janellie Tennison: PA [name], [name] Other Clinician: [name] Referring Masie Bermingham: Treating Lilyrose Tanney/Extender: [name] in Treatment: 7 HBO Safety Checklist Items Safety Checklist Consent Form Signed Patient voided / foley secured and emptied When did you last eato N/A Last dose of injectable or oral agent n/a Ostomy pouch emptied and vented if applicable NA All implantable devices assessed, documented and approved NA Intravenous access site secured and place NA Valuables secured Linens and cotton and cotton/polyester blend (less than 51% polyester) Personal oil-based products / skin lotions / body lotions removed Wigs or hairpieces removed Smoking or tobacco materials removed NA Books / newspapers / magazines / loose paper removed Cologne, aftershave, perfume and deodorant removed Jewelry removed (may wrap wedding band) Make-up removed Hair care products removed Battery operated devices (external) removed NA Heating patches and chemical warmers removed NA Titanium eyewear removed NA Nail polish cured greater than 10 hours NA Casting material cured greater than 10 hours NA Hearing aids removed NA Loose dentures or partials removed NA Prosthetics have been removed NA Patient  demonstrates correct use of air break device (if applicable) Patient concerns have been addressed Patient grounding bracelet on and cord attached to chamber Specifics for Inpatients (complete in addition to above) Medication sheet sent with patient Intravenous medications needed or due during therapy sent with patient Drainage  tubes (e.g. nasogastric tube or chest tube secured and vented) Endotracheal or Tracheotomy tube secured Cuff deflated of air and inflated with saline Airway suctioned Electronic Signature(s) Signed: 07/15/2019 5:46:32 PM By: Mikeal Hawthorne EMT/HBOT/SD Entered By: Mikeal Hawthorne on 07/15/2019 17:44:41

## 2019-07-16 NOTE — Progress Notes (Signed)
BOWIE, DOIRON ([id number]) Visit Report for 07/16/2019 Arrival Information Details Patient Name: Date of Service: [name], Sara A. 07/16/2019 3:00 PM Medical Record Number: [id number] Patient Account Number: [id number] Date of Birth/Sex: Treating RN: [birth year] (86 y.o. [name] Primary Care Lacee Grey: Sara [name], [place] Other Clinician: Mikeal Hawthorne Referring Phillipe Clemon: Treating Chea Malan/Extender: [name] in Treatment: 7 Visit Information History Since Last Visit Added or deleted any medications: No Patient Arrived: [name] Any new allergies or adverse reactions: No Arrival Time: 14:30 Had a fall or experienced change in No Accompanied By: self activities of daily living that may affect Transfer Assistance: None risk of falls: Patient Identification Verified: Yes Signs or symptoms of abuse/neglect since last visito No Secondary Verification Process Completed: Yes Hospitalized since last visit: No Patient Requires Transmission-Based Precautions: No Implantable device outside of the clinic excluding No Patient Has Alerts: No cellular tissue based products placed in the center since last visit: Pain Present Now: No Electronic Signature(s) Signed: 07/16/2019 5:27:00 PM By: Mikeal Hawthorne EMT/HBOT/SD Entered By: Mikeal Hawthorne on 07/16/2019 16:51:21 -------------------------------------------------------------------------------- Encounter Discharge Information Details Patient Name: Date of Service: Sara RER, Sara [name] 07/16/2019 3:00 PM Medical Record Number: [id number] Patient Account Number: [id number] Date of Birth/Sex: Treating RN: 03-23-1929 (86 y.o. [name] Primary Care Shannan Slinker: Sara [name], [name] Other Clinician: Mikeal Hawthorne Referring Novella Abraha: Treating Uriah Trueba/Extender: [name] in Treatment: 7 Encounter Discharge Information Items Discharge Condition: Stable Ambulatory Status: Walker Discharge Destination:  Home Transportation: Private Auto Accompanied By: self Schedule Follow-up Appointment: Yes Clinical Summary of Care: Patient Declined Electronic Signature(s) Signed: 07/16/2019 5:27:00 PM By: Mikeal Hawthorne EMT/HBOT/SD Entered By: Mikeal Hawthorne on 07/16/2019 17:26:34 -------------------------------------------------------------------------------- Patient/Caregiver Education Details Patient Name: Date of Service: [name], [name] 6/30/2021andnbsp3:00 PM Medical Record Number: [id number] Patient Account Number: [id number] Date of Birth/Gender: Treating RN: September 06, 1929 (86 y.o. [name] Primary Care Physician: Sara [name], [name] Other Clinician: Mikeal Hawthorne Referring Physician: Treating Physician/Extender: [name] in Treatment: 7 Education Assessment Education Provided To: Patient Education Topics Provided Hyperbaric Oxygenation: Methods: Explain/Verbal Responses: State content correctly Electronic Signature(s) Signed: 07/16/2019 5:27:00 PM By: Mikeal Hawthorne EMT/HBOT/SD Entered By: Mikeal Hawthorne on 07/16/2019 17:26:20 -------------------------------------------------------------------------------- Vitals Details Patient Name: Date of Service: Sara Benne, Sara [name] 07/16/2019 3:00 PM Medical Record Number: [id number] Patient Account Number: [id number] Date of Birth/Sex: Treating RN: 1929/02/20 (86 y.o. [name] Primary Care Andreea Arca: Sara [name], [name] Other Clinician: Mikeal Hawthorne Referring Kelli Egolf: Treating Camielle Sizer/Extender: [name] in Treatment: 7 Vital Signs Time Taken: 14:35 Temperature (F): 97.5 Height (in): 65 Pulse (bpm): 67 Weight (lbs): 138 Respiratory Rate (breaths/min): 12 Body Mass Index (BMI): 23 Blood Pressure (mmHg): 97/58 Reference Range: 80 - 120 mg / dl Electronic Signature(s) Signed: 07/16/2019 5:27:00 PM By: Mikeal Hawthorne EMT/HBOT/SD Entered By: Mikeal Hawthorne on 07/16/2019 16:51:37

## 2019-07-16 NOTE — Progress Notes (Signed)
[name], [name] ([id number]) Visit Report for 07/16/2019 SuperBill Details Patient Name: Date of Service: [name], [name] 07/16/2019 Medical Record Number: [id number] Patient Account Number: [id number] Date of Birth/Sex: Treating RN: [birth year] (86 y.o. 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, unspecified C83.30 Diffuse large B-cell lymphoma, unspecified site Facility Procedures CPT4 Code Description Modifier Quantity [id number] G0277-(Facility Use Only) HBOT full body chamber, 89min , 4 Physician Procedures Quantity CPT4 Code Description Modifier [id number] 24235 - WC PHYS HYPERBARIC OXYGEN THERAPY 1 ICD-10 Diagnosis Description 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

## 2019-07-16 NOTE — Progress Notes (Addendum)
Sara, SCHRUPP ([id number]) Visit Report for 07/16/2019 HBO Details Patient Name: Date of Service: [place], [name] 07/16/2019 3:00 PM Medical Record Number: [id number] Patient Account Number: [id number] Date of Birth/Sex: Treating RN: September 03, 1929 (86 y.o. 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 HBO Treatment Details Treatment Number: 12 Patient Type: Outpatient Chamber Type: Monoplace Chamber Serial #: [id number] Treatment Protocol: 2.0 ATA with 90 minutes oxygen, and no air breaks Treatment Details Compression Rate Down: 1.0 psi / minute De-Compression Rate Up: 3.0 psi / minute Air breaks and breathing Decompress Decompress Compress Tx Pressure Begins Reached periods Begins Ends (leave unused spaces blank) Chamber Pressure (ATA 1 2 ------2 1 ) Clock Time (24 hr) 15:09 15:24 - - - - - - 17:04 17:10 Treatment Length: 121 (minutes) Treatment Segments: 4 Vital Signs Capillary Blood Glucose Reference Range: 80 - 120 mg / dl HBO Diabetic Blood Glucose Intervention Range: <131 mg/dl or >249 mg/dl Time Vitals Blood Respiratory Capillary Blood Glucose Pulse Action Type: Pulse: Temperature: Taken: Pressure: Rate: Glucose (mg/dl): Meter #: Oximetry (%) Taken: Pre 14:35 97/58 67 12 97.5 Post 17:12 135/74 55 14 97.8 Treatment Response Treatment Toleration: Well Treatment Completion Status: Treatment Completed without Adverse Event Electronic Signature(s) Signed: 07/16/2019 5:27:00 PM By: Mikeal Hawthorne EMT/HBOT/SD Signed: 07/16/2019 5:59:30 PM By: Worthy Keeler PA-C Entered By: Mikeal Hawthorne on 07/16/2019  17:26:00 -------------------------------------------------------------------------------- HBO Safety Checklist Details Patient Name: Date of Service: Sara Benne, [name] 07/16/2019 3:00 PM Medical Record Number: [id number] Patient Account Number: [id number] Date of Birth/Sex: Treating RN: [birth year] (86 y.o. [name] Primary Care Lorey Pallett: PA [name], [place] Other Clinician: Mikeal Hawthorne Referring Desera Graffeo: Treating Ralonda Tartt/Extender: [name] in Treatment: 7 HBO Safety Checklist Items Safety Checklist Consent Form Signed Patient voided / foley secured and emptied When did you last eato n/a Last dose of injectable or oral agent n/a Ostomy pouch emptied and vented if applicable NA All implantable devices assessed, documented and approved NA Intravenous access site secured and place NA Valuables secured Linens and cotton and cotton/polyester blend (less than 51% polyester) Personal oil-based products / skin lotions / body lotions removed Wigs or hairpieces removed NA Smoking or tobacco materials removed NA Books / newspapers / magazines / loose paper removed Cologne, aftershave, perfume and deodorant removed Jewelry removed (may wrap wedding band) Make-up removed Hair care products removed Battery operated devices (external) removed NA Heating patches and chemical warmers removed NA Titanium eyewear removed NA Nail polish cured greater than 10 hours Casting material cured greater than 10 hours NA Hearing aids removed NA Loose dentures or partials removed NA Prosthetics have been removed NA Patient demonstrates correct use of air break device (if applicable) Patient concerns have been addressed Patient grounding bracelet on and cord attached to chamber Specifics for Inpatients (complete in addition to above) Medication sheet sent with patient Intravenous medications needed or due during therapy sent with patient Drainage tubes (e.g.  nasogastric tube or chest tube secured and vented) Endotracheal or Tracheotomy tube secured Cuff deflated of air and inflated with saline Airway suctioned Electronic Signature(s) Signed: 07/16/2019 4:52:17 PM By: Mikeal Hawthorne EMT/HBOT/SD Entered By: Mikeal Hawthorne on 07/16/2019 16:52:17

## 2019-07-16 NOTE — Progress Notes (Signed)
JOWANNA, LOEFFLER ([id number]) Visit Report for 07/15/2019 Arrival Information Details Patient Name: Date of Service: [place], [name] 07/15/2019 2:15 PM Medical Record Number: [id number] Patient Account Number: [id number] Date of Birth/Sex: Treating RN: 11/25/1929 (86 y.o. [name] Primary Care Taylon Coole: PA TIENT, NO Other Clinician: Referring Zamzam Whinery: Treating Daiel Strohecker/Extender: [name] in Treatment: 7 Visit Information History Since Last Visit Added or deleted any medications: No Patient Arrived: Walker Any new allergies or adverse reactions: No Arrival Time: 14:40 Had a fall or experienced change in No Accompanied By: alone activities of daily living that may affect Transfer Assistance: None risk of falls: Patient Identification Verified: Yes Signs or symptoms of abuse/neglect since last visito No Secondary Verification Process Completed: Yes Hospitalized since last visit: No Patient Requires Transmission-Based Precautions: No Implantable device outside of the clinic excluding No Patient Has Alerts: No cellular tissue based products placed in the center since last visit: Has Dressing in Place as Prescribed: Yes Pain Present Now: Yes Electronic Signature(s) Signed: 07/16/2019 5:49:00 PM By: Levan Hurst RN, BSN Entered By: Levan Hurst on 07/15/2019 14:48:24 -------------------------------------------------------------------------------- Encounter Discharge Information Details Patient Name: Date of Service: [name], [name] 07/15/2019 2:15 PM Medical Record Number: [id number] Patient Account Number: [id number] Date of Birth/Sex: Treating RN: Aug 10, 1929 (86 y.o. [name] Primary Care Vanassa Penniman: PA [name], [name] Other Clinician: Referring Alysha Doolan: Treating Domnique Vanegas/Extender: [name] in Treatment: 7 Encounter Discharge Information Items Post Procedure Vitals Discharge Condition: Stable Temperature (F):  97.8 Ambulatory Status: Walker Pulse (bpm): 67 Discharge Destination: Home Respiratory Rate (breaths/min): 16 Transportation: Private Auto Blood Pressure (mmHg): 107/60 Accompanied By: self Schedule Follow-up Appointment: Yes Clinical Summary of Care: Patient Declined Electronic Signature(s) Signed: 07/15/2019 5:22:48 PM By: Carlene Coria RN Entered By: Carlene Coria on 07/15/2019 15:19:46 -------------------------------------------------------------------------------- Lower Extremity Assessment Details Patient Name: Date of Service: [place], [name] 07/15/2019 2:15 PM Medical Record Number: [id number] Patient Account Number: [id number] Date of Birth/Sex: Treating RN: [birth year] (86 y.o. [name] Primary Care Zyaire Dumas: PA [name], NO Other Clinician: Referring Chukwuma Straus: Treating Brain Honeycutt/Extender: [name] in Treatment: 7 Edema Assessment Assessed: [Left: No] [Right: No] Edema: [Left: Ye] [Right: s] Calf Left: Right: Point of Measurement: cm From Medial Instep 33 cm cm Ankle Left: Right: Point of Measurement: cm From Medial Instep 19 cm cm Vascular Assessment Pulses: Dorsalis Pedis Palpable: [Left:Yes] Electronic Signature(s) Signed: 07/16/2019 5:49:00 PM By: Levan Hurst RN, BSN Entered By: Levan Hurst on 07/15/2019 14:49:09 -------------------------------------------------------------------------------- Multi Wound Chart Details Patient Name: Date of Service: [name], [name] 07/15/2019 2:15 PM Medical Record Number: [id number] Patient Account Number: [id number] Date of Birth/Sex: Treating RN: 22-Mar-1929 (86 y.o. 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] [N/A:N/A] Primary Etiology: [1:Cataracts, Deep Vein Thrombosis,] [N/A:N/A] Comorbid History: [1:Hypertension, Osteoarthritis, Neuropathy, Received Chemotherapy, Received Radiation, Confinement Anxiety 10/17/2018] [N/A:N/A] Date Acquired: [1:7] [N/A:N/A] Weeks of Treatment: [1:Open] [N/A:N/A] Wound Status: [1:7.4x3.8x0.6] [N/A:N/A] Measurements L x W x D (cm) [1:22.085] [N/A:N/A] A (cm) : rea [1:13.251] [N/A:N/A] Volume (cm) : [1:-11.80%] [N/A:N/A] % Reduction in Area: [1:-67.60%] [N/A:N/A] % Reduction in Volume: [1:Full Thickness With Exposed Support] [N/A:N/A] Classification: [1:Structures Medium] [N/A:N/A] Exudate A mount: [1:Serous] [N/A:N/A]  Exudate Type: [1:amber] [N/A:N/A] Exudate Color: [1:Distinct, outline attached] [N/A:N/A] Wound Margin: [1:Small (1-33%)] [N/A:N/A] Granulation A mount: [1:Pink] [N/A:N/A] Granulation Quality: [1:Large (67-100%)] [N/A:N/A] Necrotic A mount: [1:Fat Layer (Subcutaneous Tissue)] [N/A:N/A] Exposed Structures: [1:Exposed: Yes Tendon: Yes Muscle: Yes Fascia: No Joint: No Bone: No Small (1-33%)] [N/A:N/A] Epithelialization: [1:Debridement - Excisional] [N/A:N/A] Debridement: Pre-procedure Verification/Time Out 15:10 [N/A:N/A] Taken: [1:Lidocaine 5% topical ointment] [N/A:N/A] Pain Control: [1:Subcutaneous, Slough] [N/A:N/A] Tissue Debrided: [1:Skin/Subcutaneous Tissue] [N/A:N/A] Level: [1:28.12] [N/A:N/A] Debridement A (sq cm): [1:rea Curette] [N/A:N/A] Instrument: [1:Moderate] [N/A:N/A] Bleeding: [1:Pressure] [N/A:N/A] Hemostasis A chieved: [1:0] [N/A:N/A] Procedural Pain: [1:0] [N/A:N/A] Post Procedural Pain: [1:Procedure was tolerated well] [N/A:N/A] Debridement Treatment Response: [1:7.4x3.8x0.6] [N/A:N/A] Post Debridement Measurements L x W x D (cm) [1:13.251] [N/A:N/A] Post  Debridement Volume: (cm) [1:Debridement] [N/A:N/A] Treatment Notes Wound #1 (Left, Lateral Lower Leg) 1. Cleanse With Wound Cleanser 3. Primary Dressing Applied [name] 4. Secondary Dressing Dry Gauze Roll Gauze 5. Secured With Recruitment consultant) Signed: 07/15/2019 5:22:48 PM By: Carlene Coria RN Signed: 07/16/2019 3:41:00 PM By: Linton Ham MD Entered By: Linton Ham on 07/15/2019 15:24:12 -------------------------------------------------------------------------------- Multi-Disciplinary Care Plan Details Patient Name: Date of Service: [place], [name] 07/15/2019 2:15 PM Medical Record Number: [id number] Patient Account Number: [id number] Date of Birth/Sex: Treating RN: [birth year] (86 y.o. [name] Primary Care Erum Cercone: PA [name], [name] Other Clinician: Referring Maud Rubendall: Treating Starlette Thurow/Extender: [name] in Treatment: 7 Active Inactive HBO Nursing Diagnoses: Anxiety related to feelings of confinement associated with the hyperbaric oxygen chamber Anxiety related to knowledge deficit of hyperbaric oxygen therapy and treatment procedures Goals: Patient and/or family will be able to state/discuss factors appropriate to the management of their disease process during treatment Date Initiated: 05/27/2019 T arget Resolution Date: 06/27/2019 Goal Status: Active Patient will tolerate the hyperbaric oxygen therapy treatment Date Initiated: 05/27/2019 T arget Resolution Date: 06/27/2019 Goal Status: Active Patient will tolerate the internal climate of the chamber Date Initiated: 05/27/2019 T arget Resolution Date: 06/27/2019 Goal Status: Active Patient/caregiver will verbalize understanding of HBO goals, rationale, procedures and potential hazards Date Initiated: 05/27/2019 T arget Resolution Date: 06/27/2019 Goal Status: Active Interventions: Administer a five (5) minute air break for patient if signs and symptoms of seizure appear and notify  the hyperbaric physician Administer decongestants, per physician orders, prior to HBO2 Administer the correct therapeutic gas delivery based on the patients needs and limitations, per physician order Assess and provide for patients comfort related to the hyperbaric environment and equalization of middle ear Assess for signs and symptoms related to adverse events, including but not limited to confinement anxiety, pneumothorax, oxygen toxicity and baurotrauma Assess patient's knowledge and expectations regarding hyperbaric medicine and provide education related to the hyperbaric environment, goals of treatment and prevention of adverse events Notes: Wound/Skin Impairment Nursing Diagnoses: Knowledge deficit related to ulceration/compromised skin integrity Goals: Patient/caregiver will verbalize understanding of skin care regimen Date Initiated: 05/27/2019 Target Resolution Date: 07/31/2019 Goal Status: Active Ulcer/skin breakdown will have a volume reduction of 30% by week 4 Date Initiated: 05/27/2019 Date Inactivated: 07/01/2019 Target Resolution Date: 06/27/2019 Goal Status: Unmet Unmet Reason: comorbities Ulcer/skin breakdown will have a volume reduction of 50% by week 8 Date Initiated: 07/01/2019 Target Resolution Date: 07/31/2019 Goal Status: Active Interventions: Assess patient/caregiver ability to obtain necessary supplies Assess patient/caregiver ability to perform ulcer/skin care regimen upon admission and as needed Assess ulceration(s) every visit Notes: Electronic Signature(s) Signed: 07/15/2019 5:22:48 PM By: Carlene Coria RN Entered By: Carlene Coria on 07/15/2019 15:07:38 -------------------------------------------------------------------------------- Pain Assessment Details  Patient Name: Date of Service: [place], [name] 07/15/2019 2:15 PM Medical Record Number: [id number] Patient Account Number: [id number] Date of Birth/Sex: Treating RN: 1929/01/21 (86 y.o. [name] Primary Care Sejla Marzano: PA [name], [place] Other Clinician: Referring Zara Wendt: Treating Daylee Delahoz/Extender: [name] in Treatment: 7 Active Problems Location of Pain Severity and Description of Pain Patient Has Paino Yes Site Locations Pain Location: Pain in Ulcers With Dressing Change: Yes Duration of the Pain. Constant / Intermittento Intermittent Rate the pain. Current Pain Level: 3 Character of Pain Describe the Pain: Burning Pain Management and Medication Current Pain Management: Medication: Yes Cold Application: No Rest: No Massage: No Activity: No T.E.N.S.: No Heat Application: No Leg drop or elevation: No Is the Current Pain Management Adequate: Adequate How does your wound impact your activities of daily livingo Sleep: No Bathing: No Appetite: No Relationship With Others: No Bladder Continence: No Emotions: No Bowel Continence: No Work: No Toileting: No Drive: No Dressing: No Hobbies: No Electronic Signature(s) Signed: 07/16/2019 5:49:00 PM By: Levan Hurst RN, BSN Entered By: Levan Hurst on 07/15/2019 14:49:04 -------------------------------------------------------------------------------- Patient/Caregiver Education Details Patient Name: Date of Service: [name], [name] 6/29/2021andnbsp2:15 PM Medical Record Number: [id number] Patient Account Number: [id number] Date of Birth/Gender: Treating RN: 1929/06/26 (86 y.o. [name] Primary Care Physician: PA [name], [name] Other Clinician: Referring Physician: Treating Physician/Extender: [name] in Treatment: 7 Education Assessment Education Provided To: Patient Education Topics Provided Wound/Skin Impairment: Methods: Explain/Verbal Responses: State content correctly Electronic Signature(s) Signed: 07/15/2019 5:22:48 PM By: Carlene Coria RN Entered By: Carlene Coria on 07/15/2019  15:08:03 -------------------------------------------------------------------------------- Wound Assessment Details Patient Name: Date of Service: [place], [name] 07/15/2019 2:15 PM Medical Record Number: [id number] Patient Account Number: [id number] Date of Birth/Sex: Treating RN: 1929/05/11 (86 y.o. 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: Wound Location: Left, Lateral Lower Leg Wound Open Wounding Event: Bump Status: Date Acquired: 10/17/2018 Comorbid Cataracts, Deep Vein Thrombosis, Hypertension, Osteoarthritis, Weeks Of Treatment: 7 History: Neuropathy, Received Chemotherapy, Received Radiation, Clustered Wound: No Confinement Anxiety Photos Photo Uploaded By: Mikeal Hawthorne on 07/16/2019 15:43:58 Wound Measurements Length: (cm) 7.4 Width: (cm) 3.8 Depth: (cm) 0.6 Area: (cm) 22.085 Volume: (cm) 13.251 % Reduction in Area: -11.8% % Reduction in Volume: -67.6% Epithelialization: Small (1-33%) Tunneling: No Undermining: No Wound Description Classification: Full Thickness With Exposed Support Structures Wound Margin: Distinct, outline attached Exudate Amount: Medium Exudate Type: Serous Exudate Color: amber Foul Odor After Cleansing: No Slough/Fibrino Yes Wound Bed Granulation Amount: Small (1-33%) Exposed Structure Granulation Quality: Pink Fascia Exposed: No Necrotic Amount: Large (67-100%) Fat Layer (Subcutaneous Tissue) Exposed: Yes Necrotic Quality: Adherent Slough Tendon Exposed: Yes Muscle Exposed: Yes Necrosis of Muscle: No Joint Exposed: No Bone Exposed: No Treatment Notes Wound #1 (Left, Lateral Lower Leg) 1. Cleanse With Wound Cleanser 3. Primary Dressing Applied [name] 4. Secondary Dressing Dry Gauze Roll Gauze 5. Secured With Architect) Signed: 07/16/2019 5:49:00 PM By: Levan Hurst RN, BSN Entered By: Levan Hurst on 07/15/2019 14:49:35 -------------------------------------------------------------------------------- [place] Details Patient Name: Date of Service: [name], [name] 07/15/2019 2:15 PM Medical Record Number: [id number] Patient Account Number: [id number] Date of Birth/Sex: Treating RN: July 19, 1929 (85 y.o. [name] Primary Care Bessie Boyte: PA Darnelle Spangle Other Clinician: Referring Perseus Westall: Treating Cailen Texeira/Extender: [name] in Treatment: 7 Vital Signs Time Taken:  14:42 Temperature (F): 97.8 Height (in): 65 Pulse (bpm): 67 Weight (lbs): 138 Respiratory Rate (breaths/min): 16 Body Mass Index (BMI): 23 Blood Pressure (mmHg): 107/60 Reference Range: 80 - 120 mg / dl Electronic Signature(s) Signed: 07/16/2019 5:49:00 PM By: Levan Hurst RN, BSN Entered By: Levan Hurst on 07/15/2019 14:48:41

## 2019-07-16 NOTE — Progress Notes (Signed)
[name], [name] ([id number]) Visit Report for 07/15/2019 Debridement Details Patient Name: Date of Service: [place], [name] 07/15/2019 2:15 PM Medical Record Number: [id number] Patient Account Number: [id number] Date of Birth/Sex: Treating RN: 01/14/1930 (86 y.o. [name] Primary Care Provider: PA [name], [name] Other Clinician: Referring Provider: Treating Provider/Extender: [name] in Treatment: 7 Debridement Performed for Assessment: Wound #1 Left,Lateral Lower Leg Performed By: Physician Ricard Dillon., MD Debridement Type: Debridement Level of Consciousness (Pre-procedure): Awake and Alert Pre-procedure Verification/Time Out Yes - 15:10 Taken: Start Time: 15:10 Pain Control: Lidocaine 5% topical ointment T Area Debrided (L x W): otal 7.4 (cm) x 3.8 (cm) = 28.12 (cm) Tissue and other material debrided: Viable, Non-Viable, Slough, Subcutaneous, Skin: Dermis , Skin: Epidermis, Slough Level: Skin/Subcutaneous Tissue Debridement Description: Excisional Instrument: Curette Bleeding: Moderate Hemostasis Achieved: Pressure End Time: 15:16 Procedural Pain: 0 Post Procedural Pain: 0 Response to Treatment: Procedure was tolerated well Level of Consciousness (Post- Awake and Alert procedure): Post Debridement Measurements of Total Wound Length: (cm) 7.4 Width: (cm) 3.8 Depth: (cm) 0.6 Volume: (cm) 13.251 Character of Wound/Ulcer Post Debridement: Improved Post Procedure Diagnosis Same as Pre-procedure Electronic Signature(s) Signed: 07/15/2019 5:22:48 PM By: Carlene Coria RN Signed: 07/16/2019 3:41:00 PM By: Linton Ham MD Entered By: Linton Ham on 07/15/2019 15:24:21 -------------------------------------------------------------------------------- HPI Details Patient Name: Date of Service: Sara Benne, [name] 07/15/2019 2:15 PM Medical Record Number: [id number] Patient Account Number: [id number] Date of Birth/Sex: Treating  RN: December 23, 1929 (86 y.o. [name] Primary Care Provider: PA [name], [name] Other Clinician: Referring Provider: Treating Provider/Extender: [name] in Treatment: 7 History of Present Illness HPI Description: ADMISSION 05/27/2019 This is an 86 year old woman who came here for consideration of hyperbaric oxygen. She has a very complicated history. She is been treated for diffuse large B-cell lymphoma since 2006. She has had 2 recurrences of this. Her most recent chemotherapy round was Treanda plus polatuzumab which ended I believe in February. She is not currently on chemotherapy. She came to see [place] with a large wound on the left lateral lower leg. Apparently this started as a lump sometime in October 2020. She received courses of antibiotics which did not help. She then had a biopsy and it actually showed lymphoma. The wound never really healed. And she developed a progressive ulcerated tumor. She underwent a course of radiation in late October into November 2020. This resulted in some improvement in the mass but she has been left with a deep punched out wound with exposed tendon. She was seen by Dr. Jaclynn Guarneri at the wound care center in [facility]. She considered her for operative debridement and/or consideration of hyperbaric oxygen. She was also seen in April on 05/07/2019 by Dr. Zigmund Daniel at the wound care center in [facility]. Arterial and venous reflux studies were ordered but I am unable to see these results. 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 She has been using [name] for a prolonged period of time for perhaps 5 weeks with some improvement in the wound surface. She has not had the wound rebiopsied but she did have a PET scan on 03/18/2027  that did show residual infiltrating soft tissue density involving the left lower calf but without discrete measurable mass and minimal uptake. I  think this is felt to lend credibility to the fact that the primary mechanism here is radiation injury. At the same time there was no new areas of metabolically active lymphoma identified. She therefore has not been on any ongoing chemotherapy. She also had a CT scan of the left lower extremity on 05/21/2019 that did not show any fracture or dislocation of the left tibia or fibula large soft tissue ulcer overlying the distal tibia no evidence of bony erosion or sclerosis to suggest osteomyelitis. 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 , The patient has had prior recent arterial studies we did not do an ABI in this clinic today. 5/18; wound looks about the same. Certainly no improvement in condition of the wound bed. She has been using [name] change daily. Deep necrotic wound on the left lateral lower calf 6/1; absolutely no change in this wound. Completely nonviable surface with tendon in the middle. She ran out of [name] last week in my absence and has been using what sounds like Vache solution. I will refax the [name] to [facility] I have her echocardiogram report. Her valve area is 1.25 mild to moderate AS. I think this is slightly worse than 3 years ago but this should not preclude her treatment with hyperbarics. I managed to creep in Dr. Kennon Holter interaction with his own nurse with regards to this question and I think he is approved that. I will text him just to make sure 6/15; some improvement in the wound surface using [name] with daily dressing changes. She is washing this off daily. Patient started hyperbarics today. 6/29; patient seen today in conjunction with HBO. She is doing dive #11 today. 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 07/15/2019 2:15 PM Medical Record Number: [id number] Patient Account Number: [id number] Date of Birth/Sex: Treating RN: November 05, 1929 (86 y.o. [name] Primary Care Provider: PA [name], [name] Other Clinician: Referring Provider: Treating Provider/Extender: [name] in Treatment: 7 Notes Wound exam; large punched-out area on the left mid calf. There is still exposed tendon in the middle. She has more exposed granulation. Using a #5 curette a reasonably aggressive debridement which she tolerates well. The tissue wondered need the necrotic surface of this bleeds easily and looks vibrant. No evidence of infection. Electronic Signature(s) Signed: 07/16/2019 3:41:00 PM By: Linton Ham MD Entered By: Linton Ham on 07/15/2019 15:26:54 -------------------------------------------------------------------------------- Physician Orders Details Patient Name: Date of Service: [name], [name] 07/15/2019 2:15 PM Medical Record Number: [id number] Patient Account Number: [id number] Date of Birth/Sex: Treating RN: [birth year] (86 y.o. [name] Primary Care Provider: PA TIENT, [name] Other Clinician: Referring Provider: Treating Provider/Extender: [name] in Treatment: 7 Verbal / Phone Orders: No Diagnosis Coding ICD-10 Coding Code Description [phone] Non-pressure chronic ulcer of left calf with necrosis of muscle L59.9 Disorder of the skin and subcutaneous tissue related to radiation, unspecified C83.30 Diffuse large B-cell lymphoma, unspecified site Follow-up Appointments Return Appointment in 2 weeks. Dressing Change  Frequency Change dressing every day. Wound Cleansing Wound #1 Left,Lateral Lower Leg May shower and wash wound with soap and water. Primary Wound Dressing [name] Ointment Secondary Dressing Kerlix/Rolled Gauze - secure with  tape Dry Gauze Hyperbaric Oxygen Therapy Indication: - soft tissue radiation necrosis If appropriate for treatment, begin HBOT per protocol: 2.0 ATA for 90 Minutes without A Breaks ir Total Number of Treatments: - 40 treatments One treatments per day (delivered Monday through Friday unless otherwise specified in Special Instructions below): Electronic Signature(s) Signed: 07/15/2019 5:22:48 PM By: Carlene Coria RN Signed: 07/16/2019 3:41:00 PM By: Linton Ham MD Entered By: Carlene Coria on 07/15/2019 15:07:17 -------------------------------------------------------------------------------- Problem List Details Patient Name: Date of Service: Sara Benne, [name] 07/15/2019 2:15 PM Medical Record Number: [id number] Patient Account Number: [id number] Date of Birth/Sex: Treating RN: 10/05/1929 (86 y.o. 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 07/15/2019 2:15 PM Medical Record Number:  [id number] Patient Account Number: [id number] Date of Birth/Sex: Treating RN: May 09, 1929 (86 y.o. [name] Primary Care Provider: PA [name], [name] Other Clinician: Referring Provider: Treating Provider/Extender: [name] in Treatment: 7 Subjective History of Present Illness (HPI) ADMISSION 05/27/2019 This is an 86 year old woman who came here for consideration of hyperbaric oxygen. She has a very complicated history. She is been treated for diffuse large B-cell lymphoma since 2006. She has had 2 recurrences of this. Her most recent chemotherapy round was Treanda plus polatuzumab which ended I believe in February. She is not currently on chemotherapy. She came to see [place] with a large wound on the left lateral lower leg. Apparently this started as a lump sometime in October 2020. She received courses of antibiotics which did not help. She then had a biopsy and it actually showed lymphoma. The wound never really healed. And she developed a progressive ulcerated tumor. She underwent a course of radiation in late October into November 2020. This resulted in some improvement in the mass but she has been left with a deep punched out wound with exposed tendon. She was seen by Dr. Jaclynn Guarneri at the wound care center in [facility]. She considered her for operative debridement and/or consideration of hyperbaric oxygen. She was also seen in April on 05/07/2019 by Dr. Zigmund Daniel at the wound care center in [facility]. Arterial and venous reflux studies were ordered but I am unable to see these results. I think preparations were being made for hyperbaric oxygen therapy but the patient lives in [place] and so she arrives here for consideration of hyperbaric oxygen for soft tissue radiation injury Other factors in this is that she does not tolerate compression because of pain. She has been using [name] for a prolonged period of time for perhaps 5 weeks with some improvement in the wound surface. She  has not had the wound rebiopsied but she did have a PET scan on 03/18/2027 that did show residual infiltrating soft tissue density involving the left lower calf but without discrete measurable mass and minimal uptake. 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 At the same time there was  no new areas of metabolically active lymphoma identified. She therefore has not been on any ongoing chemotherapy. She also had a CT scan of the left lower extremity on 05/21/2019 that did not show any fracture or dislocation of the left tibia or fibula large soft tissue ulcer overlying the distal tibia no evidence of bony erosion or sclerosis to suggest osteomyelitis. 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 , The patient has had prior recent arterial studies we did not do an ABI in this clinic today. 5/18; wound looks about the same. Certainly no improvement in condition of the wound bed. She has been using [name] change daily. Deep necrotic wound on the left lateral lower calf 6/1; absolutely no change in this wound. Completely nonviable surface with tendon in the middle. She ran out of [name] last week in my absence and has been using what sounds like Vache solution. I will refax the [name] to [facility] I have her echocardiogram report. Her valve area is 1.25 mild to moderate AS. I think this is slightly worse than 3 years ago but this should not preclude her treatment with hyperbarics. I managed to creep in Dr. Kennon Holter interaction with his own nurse with regards to this question and I think he is approved that. I will text him just to make sure 6/15; some improvement in the wound surface using [name] with daily dressing changes. She is washing this off daily. Patient started hyperbarics today. 6/29; patient seen today in conjunction with  HBO. She is doing dive #11 today. 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 Integumentary (Hair, Skin) Wound #1 status is Open. Original cause of wound was Bump. The wound is located on the Left,Lateral Lower Leg. The wound measures 7.4cm length x 3.8cm width x 0.6cm depth; 22.085cm^2 area and 13.251cm^3 volume. There is muscle, tendon, and Fat Layer (Subcutaneous Tissue) Exposed exposed. There is no tunneling or undermining noted. There is a medium amount of serous drainage noted. The wound margin is distinct with the outline attached to the wound base. There is small (1-33%) pink granulation within the wound bed. There is a large (67-100%) amount of necrotic tissue within the wound bed including Adherent Slough. 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 There was a Excisional Skin/Subcutaneous Tissue Debridement with a total area of 28.12 sq cm performed by Ricard Dillon., MD. With the following instrument(s): Curette to remove Viable and Non- Viable tissue/material. Material removed includes Subcutaneous Tissue, Slough, Skin: Dermis, and Skin: Epidermis after achieving pain control using Lidocaine 5% topical ointment. No specimens were taken. A time out was conducted at 15:10, prior to the start of the procedure. A Moderate amount of bleeding was controlled with Pressure. The procedure was tolerated well with a pain level of 0 throughout and a pain level  of 0 following the procedure. Post Debridement Measurements: 7.4cm length x 3.8cm width x 0.6cm depth; 13.251cm^3 volume. Character of Wound/Ulcer Post  Debridement is improved. Post procedure Diagnosis Wound #1: Same as Pre-Procedure Plan Follow-up Appointments: Return Appointment in 2 weeks. Dressing Change Frequency: Change dressing every day. Wound Cleansing: Wound #1 Left,Lateral Lower Leg: May shower and wash wound with soap and water. Primary Wound Dressing: [name] Ointment Secondary Dressing: Kerlix/Rolled Gauze - secure with tape Dry Gauze Hyperbaric Oxygen Therapy: Indication: - soft tissue radiation necrosis If appropriate for treatment, begin HBOT per protocol: 2.0 ATA for 90 Minutes without Air Breaks T Number of Treatments: - 40 treatments otal One treatments per day (delivered Monday through Friday unless otherwise specified in Special Instructions below): 1. Continue with [name]. We are making good improvement in the condition of this wound bed 2. She has had 11 treatments of hyperbarics is probably too early to say that this is helped with the tissue however I was able to get her through a fairly aggressive debridement today. 3. I found myself wondering about an advanced treatment product. The option would be consideration of a plastic surgery consult. The 2 of these are not mutually exclusive I am probably going to start exploring an advanced treatment option if we can maintain a clean surface to this wound Electronic Signature(s) Signed: 07/16/2019 3:41:00 PM By: Linton Ham MD Entered By: Linton Ham on 07/15/2019 15:29:15 -------------------------------------------------------------------------------- SuperBill Details Patient Name: Date of Service: Sara Benne, [name] 07/15/2019 Medical Record Number: [id number] Patient Account Number: [id number] Date of Birth/Sex: Treating RN: [birth year] (86 y.o. 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 Modifier: Quantity: 1 CPT4 Code: [id number] Description: 38937 - DEB SUBQ TISS EA ADDL 20CM ICD-10 Diagnosis Description 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

## 2019-07-17 ENCOUNTER — Encounter (HOSPITAL_BASED_OUTPATIENT_CLINIC_OR_DEPARTMENT_OTHER): Payer: Medicare Other | Attending: Internal Medicine | Admitting: Internal Medicine

## 2019-07-17 DIAGNOSIS — L599 Disorder of the skin and subcutaneous tissue related to radiation, unspecified: Secondary | ICD-10-CM | POA: Insufficient documentation

## 2019-07-17 DIAGNOSIS — L598 Other specified disorders of the skin and subcutaneous tissue related to radiation: Secondary | ICD-10-CM | POA: Diagnosis not present

## 2019-07-17 DIAGNOSIS — C8330 Diffuse large B-cell lymphoma, unspecified site: Secondary | ICD-10-CM | POA: Insufficient documentation

## 2019-07-17 DIAGNOSIS — L97223 Non-pressure chronic ulcer of left calf with necrosis of muscle: Secondary | ICD-10-CM | POA: Diagnosis present

## 2019-07-17 DIAGNOSIS — Y842 Radiological procedure and radiotherapy as the cause of abnormal reaction of the patient, or of later complication, without mention of misadventure at the time of the procedure: Secondary | ICD-10-CM | POA: Insufficient documentation

## 2019-07-17 NOTE — Progress Notes (Addendum)
[name], [name] ([id number]) Visit Report for 07/17/2019 HBO Details Patient Name: Date of Service: [place], [name] 07/17/2019 3:00 PM Medical Record Number: [id number] Patient Account Number: [id number] Date of Birth/Sex: Treating RN: [birth year] (86 y.o. [name] Primary Care Alyxander Kollmann: PA TIENT, [place] Other Clinician: Mikeal Hawthorne Referring Grazia Taffe: Treating Peter Keyworth/Extender: [name] in Treatment: 7 HBO Treatment Course Details Treatment Course Number: 1 Ordering Adrianna Dudas: Linton Ham T Treatments Ordered: otal 40 HBO Treatment Start Date: 07/01/2019 HBO Indication: Soft Tissue Radionecrosis to Left lateral lower leg HBO Treatment Details Treatment Number: 13 Patient Type: Outpatient Chamber Type: Monoplace Chamber Serial #: [id number] Treatment Protocol: 2.0 ATA with 90 minutes oxygen, and no air breaks Treatment Details Compression Rate Down: 1.0 psi / minute De-Compression Rate Up: 3.0 psi / minute Air breaks and breathing Decompress Decompress Compress Tx Pressure Begins Reached periods Begins Ends (leave unused spaces blank) Chamber Pressure (ATA 1 2 ------2 1 ) Clock Time (24 hr) 14:49 15:04 - - - - - - 16:34 16:40 Treatment Length: 111 (minutes) Treatment Segments: 4 Vital Signs Capillary Blood Glucose Reference Range: 80 - 120 mg / dl HBO Diabetic Blood Glucose Intervention Range: <131 mg/dl or >249 mg/dl Time Vitals Blood Respiratory Capillary Blood Glucose Pulse Action Type: Pulse: Temperature: Taken: Pressure: Rate: Glucose (mg/dl): Meter #: Oximetry (%) Taken: Pre 14:45 109/67 69 16 97.5 Post 16:42 142/67 51 14 97.8 Treatment Response Treatment Toleration: Well Treatment Completion Status: Treatment Completed without Adverse Event Shaaron Golliday Notes No concerns with treatment given Physician HBO Attestation: I certify that I supervised this HBO treatment in accordance with Medicare guidelines. A trained emergency  response team is readily available per Yes hospital policies and procedures. Continue HBOT as ordered. Yes Electronic Signature(s) Signed: 07/17/2019 5:47:00 PM By: Linton Ham MD Previous Signature: 07/17/2019 5:16:52 PM Version By: Mikeal Hawthorne EMT/HBOT/SD Entered By: Linton Ham on 07/17/2019 17:32:27 -------------------------------------------------------------------------------- HBO Safety Checklist Details Patient Name: Date of Service: [facility], [name] 07/17/2019 3:00 PM Medical Record Number: [id number] Patient Account Number: [id number] Date of Birth/Sex: Treating RN: 1929/03/26 (86 y.o. [name] Primary Care Kriston Pasquarello: PA [name], [place] Other Clinician: Mikeal Hawthorne Referring Read Bonelli: Treating Shenell Rogalski/Extender: [name] in Treatment: 7 HBO Safety Checklist Items Safety Checklist Consent Form Signed Patient voided / foley secured and emptied When did you last eato n/a Last dose of injectable or oral agent n/a Ostomy pouch emptied and vented if applicable NA All implantable devices assessed, documented and approved NA Intravenous access site secured and place NA Valuables secured Linens and cotton and cotton/polyester blend (less than 51% polyester) Personal oil-based products / skin lotions / body lotions removed Wigs or hairpieces removed NA Smoking or tobacco materials removed NA Books / newspapers / magazines / loose paper removed Cologne, aftershave, perfume and deodorant removed Jewelry removed (may wrap wedding band) Make-up removed Hair care products removed Battery operated devices (external) removed NA Heating patches and chemical warmers removed NA Titanium eyewear removed NA Nail polish cured greater than 10 hours NA Casting material cured greater than 10 hours NA Hearing aids removed NA Loose dentures or partials removed NA Prosthetics have been removed NA Patient demonstrates correct use of air break device  (if applicable) Patient concerns have been addressed Patient grounding bracelet on and cord attached to chamber Specifics for Inpatients (complete in addition to above) Medication sheet sent with patient Intravenous medications needed or due during therapy sent with patient Drainage tubes (e.g. nasogastric tube or chest tube  secured and vented) Endotracheal or Tracheotomy tube secured Cuff deflated of air and inflated with saline Airway suctioned Electronic Signature(s) Signed: 07/17/2019 2:52:31 PM By: Mikeal Hawthorne EMT/HBOT/SD Entered By: Mikeal Hawthorne on 07/17/2019 14:52:30

## 2019-07-17 NOTE — Progress Notes (Signed)
KAIDANCE, PANTOJA ([id number]) Visit Report for 07/17/2019 SuperBill Details Patient Name: Date of Service: [place], [name] 07/17/2019 Medical Record Number: [id number] Patient Account Number: [id number] Date of Birth/Sex: Treating RN: May 16, 1929 (86 y.o. 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

## 2019-07-17 NOTE — Progress Notes (Signed)
[name], [name] ([id number]) Visit Report for 07/17/2019 Arrival Information Details Patient Name: Date of Service: [place], [name] 07/17/2019 3:00 PM Medical Record Number: [id number] Patient Account Number: [id number] Date of Birth/Sex: Treating RN: October 30, 1929 (86 y.o. [name] Primary Care Rutger Salton: PA [name], NO Other Clinician: Mikeal Hawthorne Referring Yatzary Merriweather: Treating Mykeisha Dysert/Extender: [name] in Treatment: 7 Visit Information History Since Last Visit Added or deleted any medications: No Patient Arrived: [name] Any new allergies or adverse reactions: No Arrival Time: 14:40 Had a fall or experienced change in No Accompanied By: self activities of daily living that may affect Transfer Assistance: None risk of falls: Patient Identification Verified: Yes Signs or symptoms of abuse/neglect since last visito No Secondary Verification Process Completed: Yes Hospitalized since last visit: No Patient Requires Transmission-Based Precautions: No Implantable device outside of the clinic excluding No Patient Has Alerts: No cellular tissue based products placed in the center since last visit: Pain Present Now: No Electronic Signature(s) Signed: 07/17/2019 5:16:52 PM By: Mikeal Hawthorne EMT/HBOT/SD Entered By: Mikeal Hawthorne on 07/17/2019 14:51:33 -------------------------------------------------------------------------------- Encounter Discharge Information Details Patient Name: Date of Service: Sara RER, [name] 07/17/2019 3:00 PM Medical Record Number: [id number] Patient Account Number: [id number] Date of Birth/Sex: Treating RN: 15-Jun-1929 (86 y.o. [name] Primary Care Breena Bevacqua: PA [name], NO Other Clinician: Mikeal Hawthorne Referring Jihan Rudy: Treating Mayford Alberg/Extender: [name] in Treatment: 7 Encounter Discharge Information Items Discharge Condition: Stable Ambulatory Status: Walker Discharge Destination:  Home Transportation: Private Auto Accompanied By: self Schedule Follow-up Appointment: Yes Clinical Summary of Care: Patient Declined Electronic Signature(s) Signed: 07/17/2019 5:16:52 PM By: Mikeal Hawthorne EMT/HBOT/SD Entered By: Mikeal Hawthorne on 07/17/2019 17:16:21 -------------------------------------------------------------------------------- Patient/Caregiver Education Details Patient Name: Date of Service: Sara Benne, [name] 7/1/2021andnbsp3:00 PM Medical Record Number: [id number] Patient Account Number: [id number] Date of Birth/Gender: Treating RN: [birth year] (86 y.o. [name] Primary Care Physician: PA [name], [name] Other Clinician: Mikeal Hawthorne Referring Physician: Treating Physician/Extender: [name] in Treatment: 7 Education Assessment Education Provided To: Patient Education Topics Provided Hyperbaric Oxygenation: Methods: Explain/Verbal Responses: State content correctly Electronic Signature(s) Signed: 07/17/2019 5:16:52 PM By: Mikeal Hawthorne EMT/HBOT/SD Entered By: Mikeal Hawthorne on 07/17/2019 17:16:00 -------------------------------------------------------------------------------- Vitals Details Patient Name: Date of Service: Sara RER, [name] 07/17/2019 3:00 PM Medical Record Number: [id number] Patient Account Number: [id number] Date of Birth/Sex: Treating RN: [birth year] (86 y.o. [name] Primary Care Reyli Schroth: PA [name], NO Other Clinician: Mikeal Hawthorne Referring Shyna Duignan: Treating Amitai Delaughter/Extender: [name] in Treatment: 7 Vital Signs Time Taken: 14:45 Temperature (F): 97.5 Height (in): 65 Pulse (bpm): 69 Weight (lbs): 138 Respiratory Rate (breaths/min): 16 Body Mass Index (BMI): 23 Blood Pressure (mmHg): 109/67 Reference Range: 80 - 120 mg / dl Electronic Signature(s) Signed: 07/17/2019 5:16:52 PM By: Mikeal Hawthorne EMT/HBOT/SD Entered By: Mikeal Hawthorne on 07/17/2019 14:51:49

## 2019-07-18 ENCOUNTER — Other Ambulatory Visit: Payer: Self-pay

## 2019-07-18 ENCOUNTER — Encounter (HOSPITAL_BASED_OUTPATIENT_CLINIC_OR_DEPARTMENT_OTHER): Payer: Medicare Other | Admitting: Internal Medicine

## 2019-07-18 DIAGNOSIS — L598 Other specified disorders of the skin and subcutaneous tissue related to radiation: Secondary | ICD-10-CM | POA: Diagnosis not present

## 2019-07-18 NOTE — Progress Notes (Signed)
JERLYN, PAIN ([id number]) Visit Report for 07/18/2019 Arrival Information Details Patient Name: Date of Service: [place], [name] 07/18/2019 3:00 PM Medical Record Number: [id number] Patient Account Number: [id number] Date of Birth/Sex: Treating RN: 08/30/1929 (86 y.o. [name] Primary Care Kodee Drury: PA [name], NO Other Clinician: Mikeal Hawthorne Referring Israel Werts: Treating Greycen Felter/Extender: [name] in Treatment: 7 Visit Information History Since Last Visit Added or deleted any medications: No Patient Arrived: [name] Any new allergies or adverse reactions: No Arrival Time: 14:45 Had a fall or experienced change in No Accompanied By: self activities of daily living that may affect Transfer Assistance: None risk of falls: Patient Identification Verified: Yes Signs or symptoms of abuse/neglect since last visito No Secondary Verification Process Completed: Yes Hospitalized since last visit: No Patient Requires Transmission-Based Precautions: No Implantable device outside of the clinic excluding No Patient Has Alerts: No cellular tissue based products placed in the center since last visit: Pain Present Now: No Electronic Signature(s) Signed: 07/18/2019 5:00:01 PM By: Mikeal Hawthorne EMT/HBOT/SD Entered By: Mikeal Hawthorne on 07/18/2019 15:07:24 -------------------------------------------------------------------------------- Encounter Discharge Information Details Patient Name: Date of Service: [name], [name] 07/18/2019 3:00 PM Medical Record Number: [id number] Patient Account Number: [id number] Date of Birth/Sex: Treating RN: 1929/11/28 (86 y.o. [name] Primary Care Kariss Longmire: PA [name], [name] Other Clinician: Mikeal Hawthorne Referring Romanda Turrubiates: Treating Nykayla Marcelli/Extender: [name] in Treatment: 7 Encounter Discharge Information Items Discharge Condition: Stable Ambulatory Status: Walker Discharge Destination:  Home Transportation: Private Auto Accompanied By: self Schedule Follow-up Appointment: Yes Clinical Summary of Care: Patient Declined Electronic Signature(s) Signed: 07/18/2019 5:00:01 PM By: Mikeal Hawthorne EMT/HBOT/SD Entered By: Mikeal Hawthorne on 07/18/2019 16:58:50 -------------------------------------------------------------------------------- Patient/Caregiver Education Details Patient Name: Date of Service: [name], [name] 7/2/2021andnbsp3:00 PM Medical Record Number: [id number] Patient Account Number: [id number] Date of Birth/Gender: Treating RN: 10-29-1929 (86 y.o. [name] Primary Care Physician: PA [name], [name] Other Clinician: Mikeal Hawthorne Referring Physician: Treating Physician/Extender: [name] in Treatment: 7 Education Assessment Education Provided To: Patient Education Topics Provided Hyperbaric Oxygenation: Methods: Explain/Verbal Responses: State content correctly Electronic Signature(s) Signed: 07/18/2019 5:00:01 PM By: Mikeal Hawthorne EMT/HBOT/SD Entered By: Mikeal Hawthorne on 07/18/2019 16:58:36 -------------------------------------------------------------------------------- Vitals Details Patient Name: Date of Service: [name], [name] 07/18/2019 3:00 PM Medical Record Number: [id number] Patient Account Number: [id number] Date of Birth/Sex: Treating RN: [birth year] (86 y.o. [name] Primary Care Anthone Prieur: PA [name], NO Other Clinician: Mikeal Hawthorne Referring Keino Placencia: Treating Percilla Tweten/Extender: [name] in Treatment: 7 Vital Signs Time Taken: 14:50 Temperature (F): 97.7 Height (in): 65 Pulse (bpm): 67 Weight (lbs): 138 Respiratory Rate (breaths/min): 16 Body Mass Index (BMI): 23 Blood Pressure (mmHg): 110/69 Reference Range: 80 - 120 mg / dl Electronic Signature(s) Signed: 07/18/2019 5:00:01 PM By: Mikeal Hawthorne EMT/HBOT/SD Entered By: Mikeal Hawthorne on 07/18/2019 15:07:40

## 2019-07-18 NOTE — Progress Notes (Signed)
[name], [name] Visit Report for 07/18/2019 SuperBill Details Patient Name: Date of Service: [place], [name] 07/18/2019 Medical Record Number: [id number] Patient Account Number: [id number] Date of Birth/Sex: Treating RN: 12-Jan-1930 (86 y.o. 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

## 2019-07-18 NOTE — Progress Notes (Addendum)
KATIEJO, [name] ([id number]) Visit Report for 07/18/2019 HBO Details Patient Name: Date of Service: [name], [name] 07/18/2019 3:00 PM Medical Record Number: [id number] Patient Account Number: [id number] Date of Birth/Sex: Treating RN: 06-Aug-1929 (86 y.o. 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 HBO Treatment Details Treatment Number: 14 Patient Type: Outpatient Chamber Type: Monoplace Chamber Serial #: [id number] Treatment Protocol: 2.0 ATA with 90 minutes oxygen, and no air breaks Treatment Details Compression Rate Down: 2.0 psi / minute De-Compression Rate Up: 3.0 psi / minute Air breaks and breathing Decompress Decompress Compress Tx Pressure Begins Reached periods Begins Ends (leave unused spaces blank) Chamber Pressure (ATA 1 2 ------2 1 ) Clock Time (24 hr) 14:57 15:12 - - - - - - 16:42 16:48 Treatment Length: 111 (minutes) Treatment Segments: 4 Vital Signs Capillary Blood Glucose Reference Range: 80 - 120 mg / dl HBO Diabetic Blood Glucose Intervention Range: <131 mg/dl or >249 mg/dl Time Vitals Blood Respiratory Capillary Blood Glucose Pulse Action Type: Pulse: Temperature: Taken: Pressure: Rate: Glucose (mg/dl): Meter #: Oximetry (%) Taken: Pre 14:50 110/69 67 16 97.7 Post 16:50 130/66 52 15 98 Treatment Response Treatment Toleration: Well Treatment Completion Status: Treatment Completed without Adverse Event Melayah Skorupski Notes No concerns with treatment given Physician HBO Attestation: I certify that I supervised this HBO treatment in accordance with Medicare guidelines. A trained emergency  response team is readily available per Yes hospital policies and procedures. Continue HBOT as ordered. Yes Electronic Signature(s) Signed: 07/18/2019 5:03:02 PM By: Linton Ham MD Previous Signature: 07/18/2019 5:00:01 PM Version By: Mikeal Hawthorne EMT/HBOT/SD Entered By: Linton Ham on 07/18/2019 17:01:04 -------------------------------------------------------------------------------- HBO Safety Checklist Details Patient Name: Date of Service: [facility], [name] 07/18/2019 3:00 PM Medical Record Number: [id number] Patient Account Number: [id number] Date of Birth/Sex: Treating RN: Jun 14, 1929 (86 y.o. [name] Primary Care Carolann Brazell: PA [name], [name] Other Clinician: Mikeal Hawthorne Referring Colin Ellers: Treating Devery Murgia/Extender: [name] in Treatment: 7 HBO Safety Checklist Items Safety Checklist Consent Form Signed Patient voided / foley secured and emptied When did you last eato n/a Last dose of injectable or oral agent n/a Ostomy pouch emptied and vented if applicable NA All implantable devices assessed, documented and approved NA Intravenous access site secured and place NA Valuables secured Linens and cotton and cotton/polyester blend (less than 51% polyester) Personal oil-based products / skin lotions / body lotions removed Wigs or hairpieces removed NA Smoking or tobacco materials removed NA Books / newspapers / magazines / loose paper removed Cologne, aftershave, perfume and deodorant removed Jewelry removed (may wrap wedding band) Make-up removed Hair care products removed Battery operated devices (external) removed NA Heating patches and chemical warmers removed NA Titanium eyewear removed NA Nail polish cured greater than 10 hours Casting material cured greater than 10 hours NA Hearing aids removed NA Loose dentures or partials removed NA Prosthetics have been removed NA Patient demonstrates correct use of air break device  (if applicable) Patient concerns have been addressed Patient grounding bracelet on and cord attached to chamber Specifics for Inpatients (complete in addition to above) Medication sheet sent with patient Intravenous medications needed or due during therapy sent with patient Drainage tubes (e.g. nasogastric tube or chest tube secured  and vented) Endotracheal or Tracheotomy tube secured Cuff deflated of air and inflated with saline Airway suctioned Electronic Signature(s) Signed: 07/18/2019 3:08:19 PM By: Mikeal Hawthorne EMT/HBOT/SD Entered By: Mikeal Hawthorne on 07/18/2019 15:08:18

## 2019-07-22 ENCOUNTER — Encounter (HOSPITAL_BASED_OUTPATIENT_CLINIC_OR_DEPARTMENT_OTHER): Payer: Medicare Other | Admitting: Internal Medicine

## 2019-07-22 DIAGNOSIS — L598 Other specified disorders of the skin and subcutaneous tissue related to radiation: Secondary | ICD-10-CM | POA: Diagnosis not present

## 2019-07-23 ENCOUNTER — Encounter (HOSPITAL_BASED_OUTPATIENT_CLINIC_OR_DEPARTMENT_OTHER): Payer: Medicare Other | Admitting: Physician Assistant

## 2019-07-23 DIAGNOSIS — L598 Other specified disorders of the skin and subcutaneous tissue related to radiation: Secondary | ICD-10-CM | POA: Diagnosis not present

## 2019-07-24 ENCOUNTER — Encounter (HOSPITAL_BASED_OUTPATIENT_CLINIC_OR_DEPARTMENT_OTHER): Payer: Medicare Other | Admitting: Internal Medicine

## 2019-07-24 DIAGNOSIS — L598 Other specified disorders of the skin and subcutaneous tissue related to radiation: Secondary | ICD-10-CM | POA: Diagnosis not present

## 2019-07-24 NOTE — Progress Notes (Signed)
[name] ([id number]) Visit Report for 07/22/2019 HBO Details Patient Name: Date of Service: [name], [name] 07/22/2019 3:00 PM Medical Record Number: [id number] Patient Account Number: [id number] Date of Birth/Sex: Treating RN: May 12, 1929 (86 y.o. 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 HBO Treatment Details Treatment Number: 15 Patient Type: Outpatient Chamber Type: Monoplace Chamber Serial #: [id number] Treatment Protocol: 2.0 ATA with 90 minutes oxygen, and no air breaks Treatment Details Compression Rate Down: 2.0 psi / minute De-Compression Rate Up: 3.0 psi / minute Air breaks and breathing Decompress Decompress Compress Tx Pressure Begins Reached periods Begins Ends (leave unused spaces blank) Chamber Pressure (ATA 1 2 ------2 1 ) Clock Time (24 hr) 15:01 15:09 - - - - - - 16:39 16:45 Treatment Length: 104 (minutes) Treatment Segments: 3 Vital Signs Capillary Blood Glucose Reference Range: 80 - 120 mg / dl HBO Diabetic Blood Glucose Intervention Range: <131 mg/dl or >249 mg/dl Time Vitals Blood Respiratory Capillary Blood Glucose Pulse Action Type: Pulse: Temperature: Taken: Pressure: Rate: Glucose (mg/dl): Meter #: Oximetry (%) Taken: Pre 14:53 110/56 67 12 97.9 Post 16:48 118/78 61 14 98 Treatment Response Treatment Toleration: Well Treatment Completion Status: Treatment Completed without Adverse Event Nijah Orlich Notes No concerns with treatment given Physician HBO Attestation: I certify that I supervised this HBO treatment in accordance with Medicare guidelines. A trained emergency response team is  readily available per Yes hospital policies and procedures. Continue HBOT as ordered. Yes Electronic Signature(s) Signed: 07/22/2019 5:09:59 PM By: Sara Ham MD Entered By: [name] on 07/22/2019 17:09:01 -------------------------------------------------------------------------------- HBO Safety Checklist Details Patient Name: Date of Service: [name], [name] 07/22/2019 3:00 PM Medical Record Number: [id number] Patient Account Number: [id number] Date of Birth/Sex: Treating RN: 09-Apr-1929 (86 y.o. [name] Primary Care Karem Tomaso: PA [name], NO Other Clinician: Minerva [name] Referring [name]: Treating Astin [name]/Extender: [name] in Treatment: 8 HBO Safety Checklist Items Safety Checklist Consent Form Signed Patient voided / foley secured and emptied When did you last eato N/A Last dose of injectable or oral agent Ostomy pouch emptied and vented if applicable NA All implantable devices assessed, documented and approved NA Intravenous access site secured and place NA Valuables secured Linens and cotton and cotton/polyester blend (less than 51% polyester) Personal oil-based products / skin lotions / body lotions removed Wigs or hairpieces removed Smoking or tobacco materials removed NA Books / newspapers / magazines / loose paper removed Cologne, aftershave, perfume and deodorant removed Jewelry removed (may wrap wedding band) Make-up removed Hair care products removed Battery operated devices (external) removed NA Heating patches and chemical warmers removed NA Titanium eyewear removed NA Nail polish cured greater than 10 hours NA Casting material cured greater than 10 hours NA Hearing aids removed NA Loose dentures or partials removed NA Prosthetics have been removed NA Patient demonstrates correct use of air break device (if applicable) Patient concerns have been addressed Patient grounding bracelet on and cord attached to  chamber Specifics for Inpatients (complete in addition to above) Medication sheet sent with patient Intravenous medications needed or due during therapy sent with patient Drainage tubes (e.g. nasogastric tube or chest tube secured and vented) Endotracheal or Tracheotomy tube secured Cuff deflated of air and inflated  with saline Airway suctioned Electronic Signature(s) Signed: 07/24/2019 5:11:29 PM By: [name] Entered By: [name] on 07/22/2019 16:00:47

## 2019-07-24 NOTE — Progress Notes (Signed)
[name], [name] ([id number]) Visit Report for 07/23/2019 Arrival Information Details Patient Name: Date of Service: [place], [name] 07/23/2019 3:00 PM Medical Record Number: [id number] Patient Account Number: [id number] Date of Birth/Sex: Treating RN: [birth year] (86 y.o. [name], Sara Primary Care Cherilynn Schomburg: [name], [name] Other Clinician: [name] Referring Demarious Kapur: Treating Ladasha Schnackenberg/Extender: [name] in Treatment: 8 Visit Information History Since Last Visit Added or deleted any medications: No Patient Arrived: [name] Any new allergies or adverse reactions: No Arrival Time: 14:35 Had a fall or experienced change in No Accompanied By: self activities of daily living that may affect Transfer Assistance: None risk of falls: Patient Identification Verified: Yes Signs or symptoms of abuse/neglect since last visito No Secondary Verification Process Completed: Yes Hospitalized since last visit: No Patient Requires Transmission-Based Precautions: No Implantable device outside of the clinic excluding No Patient Has Alerts: No cellular tissue based products placed in the center since last visit: Pain Present Now: No Electronic Signature(s) Signed: 07/24/2019 5:11:29 PM By: [name] Entered By: [name] on 07/23/2019 16:49:13 -------------------------------------------------------------------------------- Encounter Discharge Information Details Patient Name: Date of Service: Sara RER, Sara [name] 07/23/2019 3:00 PM Medical Record Number: [id number] Patient Account Number: [id number] Date of Birth/Sex: Treating RN: [birth year] (86 y.o. Elam [name] Primary Care Ashutosh Dieguez: [name], [name] Other Clinician: [name] Referring Maxamillion Banas: Treating Teliah Buffalo/Extender: [name] in Treatment: 8 Encounter Discharge Information Items Discharge Condition: Stable Ambulatory Status: Walker Discharge Destination: Home Transportation:  Private Auto Accompanied By: self Schedule Follow-up Appointment: Yes Clinical Summary of Care: Patient Declined Electronic Signature(s) Signed: 07/24/2019 5:11:29 PM By: [name] Entered By: [name] on 07/23/2019 16:51:59 -------------------------------------------------------------------------------- Patient/Caregiver Education Details Patient Name: Date of Service: [name], Sara TRICIA [name] 7/7/2021andnbsp3:00 PM Medical Record Number: [id number] Patient Account Number: [id number] Date of Birth/Gender: Treating RN: 1929/12/22 (86 y.o. Elam [name] Primary Care Physician: [name], [name] Other Clinician: [name] Referring Physician: Treating Physician/Extender: [name] in Treatment: 8 Education Assessment Education Provided To: Patient Education Topics Provided Hyperbaric Oxygenation: Methods: Explain/Verbal Responses: State content correctly Electronic Signature(s) Signed: 07/24/2019 5:11:29 PM By: [name] Entered By: [name] on 07/23/2019 16:51:32 -------------------------------------------------------------------------------- Vitals Details Patient Name: Date of Service: Sara RER, Sara [name] 07/23/2019 3:00 PM Medical Record Number: [id number] Patient Account Number: [id number] Date of Birth/Sex: Treating RN: Feb 18, 1929 (86 y.o. Elam [name] Primary Care [name]: [name], [name] Other Clinician: [name] Referring Fantasy Donald: Treating Makaylynn Bonillas/Extender: [name] in Treatment: 8 Vital Signs Time Taken: 14:37 Temperature (F): 97.6 Height (in): 65 Pulse (bpm): 66 Weight (lbs): 138 Respiratory Rate (breaths/min): 14 Body Mass Index (BMI): 23 Blood Pressure (mmHg): 98/65 Reference Range: 80 - 120 mg / dl Electronic Signature(s) Signed: 07/24/2019 5:11:29 PM By: [name] Entered By: [name] on 07/23/2019 16:49:46

## 2019-07-24 NOTE — Progress Notes (Signed)
[name], [name] ([id number]) Visit Report for 07/24/2019 Arrival Information Details Patient Name: Date of Service: [place], [name] 07/24/2019 3:00 PM Medical Record Number: [id number] Patient Account Number: [id number] Date of Birth/Sex: Treating RN: 1929-08-03 (86 y.o. [name] Primary Care Aleyah Balik: Sara [name], [place] Other Clinician: [name] Referring Lashan Macias: Treating Elainna Eshleman/Extender: [name] in Treatment: 8 Visit Information History Since Last Visit Added or deleted any medications: No Patient Arrived: [name] Any new allergies or adverse reactions: No Arrival Time: 14:50 Had a fall or experienced change in No Accompanied By: self activities of daily living that may affect Transfer Assistance: None risk of falls: Patient Identification Verified: Yes Signs or symptoms of abuse/neglect since last visito No Secondary Verification Process Completed: Yes Hospitalized since last visit: No Patient Requires Transmission-Based Precautions: No Implantable device outside of the clinic excluding No Patient Has Alerts: No cellular tissue based products placed in the center since last visit: Pain Present Now: No Electronic Signature(s) Signed: 07/24/2019 5:11:29 PM By: [name] Entered By: [name] on 07/24/2019 16:02:49 -------------------------------------------------------------------------------- Encounter Discharge Information Details Patient Name: Date of Service: Sara RER, Sara [name] 07/24/2019 3:00 PM Medical Record Number: [id number] Patient Account Number: [id number] Date of Birth/Sex: Treating RN: 04-Apr-1929 (86 y.o. [name] Primary Care Greidy Sherard: Sara Darnelle Spangle Other Clinician: [name] Referring Elaya Droege: Treating Julicia Krieger/Extender: [name] in Treatment: 8 Encounter Discharge Information Items Discharge Condition: Stable Ambulatory Status: Walker Discharge Destination: Home Transportation: Private  Auto Accompanied By: self Schedule Follow-up Appointment: Yes Clinical Summary of Care: Patient Declined Electronic Signature(s) Signed: 07/24/2019 5:11:29 PM By: [name] Entered By: [name] on 07/24/2019 16:50:20 -------------------------------------------------------------------------------- Patient/Caregiver Education Details Patient Name: Date of Service: [name], [name] 7/8/2021andnbsp3:00 PM Medical Record Number: [id number] Patient Account Number: [id number] Date of Birth/Gender: Treating RN: [birth year] (86 y.o. [name] Primary Care Physician: Sara [name], [name] Other Clinician: [name] Referring Physician: Treating Physician/Extender: [name] in Treatment: 8 Education Assessment Education Provided To: Patient Education Topics Provided Hyperbaric Oxygenation: Methods: Explain/Verbal Responses: State content correctly Electronic Signature(s) Signed: 07/24/2019 5:11:29 PM By: [name] Entered By: [name] on 07/24/2019 16:50:03 -------------------------------------------------------------------------------- Vitals Details Patient Name: Date of Service: Sara RER, Sara [name] 07/24/2019 3:00 PM Medical Record Number: [id number] Patient Account Number: [id number] Date of Birth/Sex: Treating RN: 1929-01-29 (86 y.o. [name] Primary Care Angeli Demilio: Sara [name], NO Other Clinician: [name] Referring Madoline Bhatt: Treating Haydyn Liddell/Extender: [name] in Treatment: 8 Vital Signs Time Taken: 14:52 Temperature (F): 97.8 Height (in): 65 Pulse (bpm): 69 Weight (lbs): 138 Respiratory Rate (breaths/min): 14 Body Mass Index (BMI): 23 Blood Pressure (mmHg): 114/66 Reference Range: 80 - 120 mg / dl Electronic Signature(s) Signed: 07/24/2019 5:11:29 PM By: [name] Entered By: [name] on 07/24/2019 16:03:32

## 2019-07-24 NOTE — Progress Notes (Signed)
ARIANAH, TORGESON ([id number]) Visit Report for 07/22/2019 SuperBill Details Patient Name: Date of Service: [place], [name] 07/22/2019 Medical Record Number: [id number] Patient Account Number: [id number] Date of Birth/Sex: Treating RN: [birth year] (86 y.o. 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

## 2019-07-24 NOTE — Progress Notes (Signed)
[name], STOCKLEY ([id number]) Visit Report for 07/23/2019 HBO Details Patient Name: Date of Service: [place], [name] 07/23/2019 3:00 PM Medical Record Number: [id number] Patient Account Number: [id number] Date of Birth/Sex: Treating RN: 05/13/1929 (86 y.o. 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 HBO Treatment Details Treatment Number: 16 Patient Type: Outpatient Chamber Type: Monoplace Chamber Serial #: [id number] Treatment Protocol: 2.0 ATA with 90 minutes oxygen, and no air breaks Treatment Details Compression Rate Down: 2.0 psi / minute De-Compression Rate Up: 3.0 psi / minute Air breaks and breathing Decompress Decompress Compress Tx Pressure Begins Reached periods Begins Ends (leave unused spaces blank) Chamber Pressure (ATA 1 2 ------2 1 ) Clock Time (24 hr) 14:50 14:58 - - - - - - 16:28 16:34 Treatment Length: 104 (minutes) Treatment Segments: 3 Vital Signs Capillary Blood Glucose Reference Range: 80 - 120 mg / dl HBO Diabetic Blood Glucose Intervention Range: <131 mg/dl or >249 mg/dl Time Vitals Blood Respiratory Capillary Blood Glucose Pulse Action Type: Pulse: Temperature: Taken: Pressure: Rate: Glucose (mg/dl): Meter #: Oximetry (%) Taken: Pre 14:37 98/65 66 14 97.6 Post 16:36 126/63 54 12 97.9 Treatment Response Treatment Toleration: Well Treatment Completion Status: Treatment Completed without Adverse Event Electronic Signature(s) Signed: 07/23/2019 5:11:14 PM By: Worthy Keeler PA-C Signed: 07/24/2019 5:11:29 PM By: [name] Entered By: [name] on 07/23/2019  16:51:06 -------------------------------------------------------------------------------- HBO Safety Checklist Details Patient Name: Date of Service: Sara Benne, [name] 07/23/2019 3:00 PM Medical Record Number: [id number] Patient Account Number: [id number] Date of Birth/Sex: Treating RN: 04/13/1929 (86 y.o. [name] Primary Care Lyndall Bellot: PA [name], [name] Other Clinician: [name] Referring Elizaveta Mattice: Treating Latisia Hilaire/Extender: [name] in Treatment: 8 HBO Safety Checklist Items Safety Checklist Consent Form Signed Patient voided / foley secured and emptied When did you last eato N/A Last dose of injectable or oral agent Ostomy pouch emptied and vented if applicable NA All implantable devices assessed, documented and approved NA Intravenous access site secured and place NA Valuables secured Linens and cotton and cotton/polyester blend (less than 51% polyester) Personal oil-based products / skin lotions / body lotions removed Wigs or hairpieces removed Smoking or tobacco materials removed NA Books / newspapers / magazines / loose paper removed Cologne, aftershave, perfume and deodorant removed Jewelry removed (may wrap wedding band) Make-up removed Hair care products removed Battery operated devices (external) removed NA Heating patches and chemical warmers removed NA Titanium eyewear removed NA Nail polish cured greater than 10 hours NA Casting material cured greater than 10 hours NA Hearing aids removed NA Loose dentures or partials removed NA Prosthetics have been removed NA Patient demonstrates correct use of air break device (if applicable) Patient concerns have been addressed Patient grounding bracelet on and cord attached to chamber Specifics for Inpatients (complete in addition to above) Medication sheet sent with patient Intravenous medications needed or due during therapy sent with patient Drainage tubes (e.g. nasogastric  tube or chest tube secured and vented) Endotracheal or Tracheotomy tube secured Cuff deflated of air and inflated with saline Airway suctioned Electronic Signature(s) Signed: 07/24/2019 5:11:29 PM By: [name] Entered By: [name] on 07/23/2019 16:50:18

## 2019-07-24 NOTE — Progress Notes (Signed)
[name], [name] ([id number]) Visit Report for 07/24/2019 HBO Details Patient Name: Date of Service: [name], [name] 07/24/2019 3:00 PM Medical Record Number: [id number] Patient Account Number: [id number] Date of Birth/Sex: Treating RN: [birth year] (86 y.o. 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 HBO Treatment Details Treatment Number: 17 Patient Type: Outpatient Chamber Type: Monoplace Chamber Serial #: [id number] Treatment Protocol: 2.0 ATA with 90 minutes oxygen, and no air breaks Treatment Details Compression Rate Down: 2.0 psi / minute De-Compression Rate Up: 3.0 psi / minute Air breaks and breathing Decompress Decompress Compress Tx Pressure Begins Reached periods Begins Ends (leave unused spaces blank) Chamber Pressure (ATA 1 2 ------2 1 ) Clock Time (24 hr) 14:57 15:07 - - - - - - 16:35 16:45 Treatment Length: 108 (minutes) Treatment Segments: 4 Vital Signs Capillary Blood Glucose Reference Range: 80 - 120 mg / dl HBO Diabetic Blood Glucose Intervention Range: <131 mg/dl or >249 mg/dl Time Vitals Blood Respiratory Capillary Blood Glucose Pulse Action Type: Pulse: Temperature: Taken: Pressure: Rate: Glucose (mg/dl): Meter #: Oximetry (%) Taken: Pre 14:52 114/66 69 14 97.8 Post 16:47 125/79 57 14 97.9 Treatment Response Treatment Toleration: Well Treatment Completion Status: Treatment Completed without Adverse Event Kisa Fujii Notes No concerns with treatment given Physician HBO Attestation: I certify that I supervised this HBO treatment in accordance with Medicare guidelines. A trained emergency  response team is readily available per Yes hospital policies and procedures. Continue HBOT as ordered. Yes Electronic Signature(s) Signed: 07/24/2019 5:28:43 PM By: Linton Ham MD Previous Signature: 07/24/2019 5:11:29 PM Version By: [name] Entered By: Linton Ham on 07/24/2019 17:27:18 -------------------------------------------------------------------------------- HBO Safety Checklist Details Patient Name: Date of Service: [name] 07/24/2019 3:00 PM Medical Record Number: [id number] Patient Account Number: [id number] Date of Birth/Sex: Treating RN: [birth year] (86 y.o. [name] Primary Care Jaanai Salemi: PA [name], [place] Other Clinician: [name] Referring Drystan Reader: Treating Amaiah Cristiano/Extender: [name] in Treatment: 8 HBO Safety Checklist Items Safety Checklist Consent Form Signed Patient voided / foley secured and emptied When did you last eato N/A Last dose of injectable or oral agent Ostomy pouch emptied and vented if applicable NA All implantable devices assessed, documented and approved NA Intravenous access site secured and place NA Valuables secured Linens and cotton and cotton/polyester blend (less than 51% polyester) Personal oil-based products / skin lotions / body lotions removed Wigs or hairpieces removed Smoking or tobacco materials removed NA Books / newspapers / magazines / loose paper removed Cologne, aftershave, perfume and deodorant removed Jewelry removed (may wrap wedding band) Make-up removed Hair care products removed Battery operated devices (external) removed NA Heating patches and chemical warmers removed NA Titanium eyewear removed NA Nail polish cured greater than 10 hours NA Casting material cured greater than 10 hours NA Hearing aids removed NA Loose dentures or partials removed NA Prosthetics have been removed NA Patient demonstrates correct use of air break device (if  applicable) Patient concerns have been addressed Patient grounding bracelet on and cord attached to chamber Specifics for Inpatients (complete in addition to above) Medication sheet sent with patient Intravenous medications needed or due during therapy sent with patient Drainage tubes (e.g. nasogastric tube or chest tube secured and vented)  Endotracheal or Tracheotomy tube secured Cuff deflated of air and inflated with saline Airway suctioned Electronic Signature(s) Signed: 07/24/2019 5:11:29 PM By: [name] Entered By: [name] on 07/24/2019 16:04:44

## 2019-07-24 NOTE — Progress Notes (Signed)
[name], [name] ([id number]) Visit Report for 07/22/2019 Arrival Information Details Patient Name: Date of Service: [name], [name] 07/22/2019 3:00 PM Medical Record Number: [id number] Patient Account Number: [id number] Date of Birth/Sex: Treating RN: January 21, 1929 (86 y.o. [name] Primary Care Becka Lagasse: PA [name], [place] Other Clinician: [name] Referring Emree Locicero: Treating Kengo Sturges/Extender: [name] in Treatment: 8 Visit Information History Since Last Visit Added or deleted any medications: No Patient Arrived: [name] Any new allergies or adverse reactions: No Arrival Time: 14:50 Had a fall or experienced change in No Accompanied By: self activities of daily living that may affect Transfer Assistance: None risk of falls: Patient Identification Verified: Yes Signs or symptoms of abuse/neglect since last visito No Secondary Verification Process Completed: Yes Hospitalized since last visit: No Patient Requires Transmission-Based Precautions: No Implantable device outside of the clinic excluding No Patient Has Alerts: No cellular tissue based products placed in the center since last visit: Pain Present Now: No Electronic Signature(s) Signed: 07/24/2019 5:11:29 PM By: [name] Entered By: [name] on 07/22/2019 15:59:28 -------------------------------------------------------------------------------- Encounter Discharge Information Details Patient Name: Date of Service: Sara RER, [name] 07/22/2019 3:00 PM Medical Record Number: [id number] Patient Account Number: [id number] Date of Birth/Sex: Treating RN: 02-Oct-1929 (86 y.o. [name] Primary Care Artist Bloom: PA Darnelle Spangle Other Clinician: [name] Referring Niti Leisure: Treating Lakshya Mcgillicuddy/Extender: [name] in Treatment: 8 Encounter Discharge Information Items Discharge Condition: Stable Ambulatory Status: Walker Discharge Destination: Home Transportation: Private  Auto Accompanied By: self Schedule Follow-up Appointment: Yes Clinical Summary of Care: Patient Declined Electronic Signature(s) Signed: 07/24/2019 5:11:29 PM By: [name] Entered By: [name] on 07/22/2019 16:47:32 -------------------------------------------------------------------------------- Patient/Caregiver Education Details Patient Name: Date of Service: Sara Benne, [name] 7/6/2021andnbsp3:00 PM Medical Record Number: [id number] Patient Account Number: [id number] Date of Birth/Gender: Treating RN: 1929-08-12 (86 y.o. [name] Primary Care Physician: PA [name], [name] Other Clinician: [name] Referring Physician: Treating Physician/Extender: [name] in Treatment: 8 Education Assessment Education Provided To: Patient Education Topics Provided Hyperbaric Oxygenation: Methods: Explain/Verbal Responses: State content correctly Electronic Signature(s) Signed: 07/24/2019 5:11:29 PM By: [name] Entered By: [name] on 07/22/2019 16:47:17 -------------------------------------------------------------------------------- Vitals Details Patient Name: Date of Service: Sara RER, [name] 07/22/2019 3:00 PM Medical Record Number: [id number] Patient Account Number: [id number] Date of Birth/Sex: Treating RN: 12-Dec-1929 (86 y.o. [name] Primary Care Kerby Borner: PA [name], [place] Other Clinician: [name] Referring Letroy Vazguez: Treating Omer Monter/Extender: [name] in Treatment: 8 Vital Signs Time Taken: 14:53 Temperature (F): 97.9 Height (in): 65 Pulse (bpm): 67 Weight (lbs): 138 Respiratory Rate (breaths/min): 12 Body Mass Index (BMI): 23 Blood Pressure (mmHg): 110/56 Reference Range: 80 - 120 mg / dl Electronic Signature(s) Signed: 07/24/2019 5:11:29 PM By: [name] Entered By: [name] on 07/22/2019 16:00:00

## 2019-07-24 NOTE — Progress Notes (Signed)
CASSIOPEIA, FLORENTINO ([id number]) Visit Report for 07/23/2019 SuperBill Details Patient Name: Date of Service: [place], [name] 07/23/2019 Medical Record Number: [id number] Patient Account Number: [id number] Date of Birth/Sex: Treating RN: Jul 23, 1929 (86 y.o. 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

## 2019-07-24 NOTE — Progress Notes (Signed)
[name], [name] ([id number]) Visit Report for 07/24/2019 SuperBill Details Patient Name: Date of Service: [place], [name] 07/24/2019 Medical Record Number: [id number] Patient Account Number: [id number] Date of Birth/Sex: Treating RN: 10-09-1929 (86 y.o. 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

## 2019-07-25 ENCOUNTER — Other Ambulatory Visit: Payer: Self-pay

## 2019-07-25 ENCOUNTER — Encounter (HOSPITAL_BASED_OUTPATIENT_CLINIC_OR_DEPARTMENT_OTHER): Payer: Medicare Other | Admitting: Internal Medicine

## 2019-07-25 DIAGNOSIS — L598 Other specified disorders of the skin and subcutaneous tissue related to radiation: Secondary | ICD-10-CM | POA: Diagnosis not present

## 2019-07-28 ENCOUNTER — Encounter (HOSPITAL_BASED_OUTPATIENT_CLINIC_OR_DEPARTMENT_OTHER): Payer: Medicare Other | Admitting: Internal Medicine

## 2019-07-28 DIAGNOSIS — L598 Other specified disorders of the skin and subcutaneous tissue related to radiation: Secondary | ICD-10-CM | POA: Diagnosis not present

## 2019-07-29 ENCOUNTER — Other Ambulatory Visit: Payer: Self-pay

## 2019-07-29 ENCOUNTER — Encounter (HOSPITAL_BASED_OUTPATIENT_CLINIC_OR_DEPARTMENT_OTHER): Payer: Medicare Other | Admitting: Internal Medicine

## 2019-07-29 DIAGNOSIS — L598 Other specified disorders of the skin and subcutaneous tissue related to radiation: Secondary | ICD-10-CM | POA: Diagnosis not present

## 2019-07-29 NOTE — Progress Notes (Addendum)
[name], [name] ([id number]) Visit Report for 07/29/2019 HBO Details Patient Name: Date of Service: [place], [name] 07/29/2019 3:00 PM Medical Record Number: [id number] Patient Account Number: [id number] Date of Birth/Sex: Treating RN: 12/10/1929 (86 y.o. 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 HBO Treatment Details Treatment Number: 20 Patient Type: Outpatient Chamber Type: Monoplace Chamber Serial #: [id number] Treatment Protocol: 2.0 ATA with 90 minutes oxygen, and no air breaks Treatment Details Compression Rate Down: 2.0 psi / minute De-Compression Rate Up: 3.0 psi / minute Air breaks and breathing Decompress Decompress Compress Tx Pressure Begins Reached periods Begins Ends (leave unused spaces blank) Chamber Pressure (ATA 1 2 ------2 1 ) Clock Time (24 hr) 15:11 15:19 - - - - - - 16:49 16:58 Treatment Length: 107 (minutes) Treatment Segments: 4 Vital Signs Capillary Blood Glucose Reference Range: 80 - 120 mg / dl HBO Diabetic Blood Glucose Intervention Range: <131 mg/dl or >249 mg/dl Time Vitals Blood Respiratory Capillary Blood Glucose Pulse Action Type: Pulse: Temperature: Taken: Pressure: Rate: Glucose (mg/dl): Meter #: Oximetry (%) Taken: Pre 14:00 108/65 70 16 98.4 Post 17:00 134/67 53 14 98.2 Treatment Response Treatment Toleration: Well Treatment Completion Status: Treatment Completed without Adverse Event Teah Votaw Notes No concerns with treatment given Physician HBO Attestation: I certify that I supervised this HBO treatment in accordance with Medicare guidelines. A trained emergency  response team is readily available per Yes hospital policies and procedures. Continue HBOT as ordered. Yes Electronic Signature(s) Signed: 07/29/2019 5:16:39 PM By: Linton Ham MD Previous Signature: 07/29/2019 5:10:59 PM Version By: [name] Entered By: Linton Ham on 07/29/2019 17:13:50 -------------------------------------------------------------------------------- HBO Safety Checklist Details Patient Name: Date of Service: [place], [name] 07/29/2019 3:00 PM Medical Record Number: [id number] Patient Account Number: [id number] Date of Birth/Sex: Treating RN: 08-12-1929 (86 y.o. [name] Primary Care Tawan Degroote: PA [name], [name] Other Clinician: [name] Referring Hye Trawick: Treating Avrohom Mckelvin/Extender: [name] in Treatment: 9 HBO Safety Checklist Items Safety Checklist Consent Form Signed Patient voided / foley secured and emptied When did you last eato N/a Last dose of injectable or oral agent Ostomy pouch emptied and vented if applicable NA All implantable devices assessed, documented and approved NA Intravenous access site secured and place NA Valuables secured Linens and cotton and cotton/polyester blend (less than 51% polyester) Personal oil-based products / skin lotions / body lotions removed Wigs or hairpieces removed Smoking or tobacco materials removed NA Books / newspapers / magazines / loose paper removed Cologne, aftershave, perfume and deodorant removed Jewelry removed (may wrap wedding band) Make-up removed Hair care products removed Battery operated devices (external) removed NA Heating patches and chemical warmers removed NA Titanium eyewear removed NA Nail polish cured greater than 10 hours NA Casting material cured greater than 10 hours NA Hearing aids removed NA Loose dentures or partials removed NA Prosthetics have been removed NA Patient demonstrates correct use of air break device (if  applicable) Patient concerns have been addressed Patient grounding bracelet on and cord attached to chamber Specifics for Inpatients (complete in addition to above) Medication sheet sent with patient Intravenous medications needed or due during therapy sent with patient Drainage tubes (e.g. nasogastric tube or chest tube secured and vented)  Endotracheal or Tracheotomy tube secured Cuff deflated of air and inflated with saline Airway suctioned Electronic Signature(s) Signed: 07/29/2019 5:10:59 PM By: [name] Entered By: [name] on 07/29/2019 17:06:38

## 2019-07-29 NOTE — Progress Notes (Addendum)
[name], [name] ([id number]) Visit Report for 07/29/2019 SuperBill Details Patient Name: Date of Service: [place], [name] 07/29/2019 Medical Record Number: [id number] Patient Account Number: [id number] Date of Birth/Sex: Treating RN: 12/06/1929 (86 y.o. 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

## 2019-07-29 NOTE — Progress Notes (Addendum)
LAGINA, READER ([id number]) Visit Report for 07/25/2019 HBO Details Patient Name: Date of Service: [place], [name] 07/25/2019 3:00 PM Medical Record Number: [id number] Patient Account Number: [id number] Date of Birth/Sex: Treating RN: 08-25-1929 (86 y.o. 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 HBO Treatment Details Treatment Number: 18 Patient Type: Outpatient Chamber Type: Monoplace Chamber Serial #: [id number] Treatment Protocol: 2.0 ATA with 90 minutes oxygen, and no air breaks Treatment Details Compression Rate Down: 2.0 psi / minute De-Compression Rate Up: 3.0 psi / minute Air breaks and breathing Decompress Decompress Compress Tx Pressure Begins Reached periods Begins Ends (leave unused spaces blank) Chamber Pressure (ATA 1 2 ------2 1 ) Clock Time (24 hr) 14:53 15:01 - - - - - - 16:31 16:40 Treatment Length: 107 (minutes) Treatment Segments: 4 Vital Signs Capillary Blood Glucose Reference Range: 80 - 120 mg / dl HBO Diabetic Blood Glucose Intervention Range: <131 mg/dl or >249 mg/dl Time Vitals Blood Respiratory Capillary Blood Glucose Pulse Action Type: Pulse: Temperature: Taken: Pressure: Rate: Glucose (mg/dl): Meter #: Oximetry (%) Taken: Pre 14:42 109/64 70 14 97.8 Post 16:42 119/95 54 14 98 Treatment Response Treatment Toleration: Well Treatment Completion Status: Treatment Completed without Adverse Event Mihika Surrette Notes No concerns with treatment given Physician HBO Attestation: I certify that I supervised this HBO treatment in accordance with Medicare guidelines. A trained emergency  response team is readily available per Yes hospital policies and procedures. Continue HBOT as ordered. Yes Electronic Signature(s) Signed: 07/29/2019 8:12:39 AM By: Linton Ham MD Entered By: Linton Ham on 07/25/2019 64:40:34 -------------------------------------------------------------------------------- HBO Safety Checklist Details Patient Name: Date of Service: [facility], [name] 07/25/2019 3:00 PM Medical Record Number: [id number] Patient Account Number: [id number] Date of Birth/Sex: Treating RN: [birth year] (86 y.o. [name] Primary Care Payton Prinsen: PA [name], [name] Other Clinician: [name] Referring Socrates Cahoon: Treating Jernie Schutt/Extender: [name] in Treatment: 8 HBO Safety Checklist Items Safety Checklist Consent Form Signed Patient voided / foley secured and emptied When did you last eato N/A Last dose of injectable or oral agent Ostomy pouch emptied and vented if applicable NA All implantable devices assessed, documented and approved NA Intravenous access site secured and place NA Valuables secured Linens and cotton and cotton/polyester blend (less than 51% polyester) Personal oil-based products / skin lotions / body lotions removed Wigs or hairpieces removed Smoking or tobacco materials removed NA Books / newspapers / magazines / loose paper removed Cologne, aftershave, perfume and deodorant removed Jewelry removed (may wrap wedding band) Make-up removed Hair care products removed Battery operated devices (external) removed NA Heating patches and chemical warmers removed NA Titanium eyewear removed NA Nail polish cured greater than 10 hours NA Casting material cured greater than 10 hours NA Hearing aids removed NA Loose dentures or partials removed NA Prosthetics have been removed NA Patient demonstrates correct use of air break device (if applicable) Patient concerns have been addressed Patient grounding bracelet on  and cord attached to chamber Specifics for Inpatients (complete in addition to above) Medication sheet sent with patient Intravenous medications needed or due during therapy sent with patient Drainage tubes (e.g. nasogastric tube or chest tube secured and vented) Endotracheal or Tracheotomy tube secured Cuff deflated of air  and inflated with saline Airway suctioned Electronic Signature(s) Signed: 07/28/2019 9:27:12 AM By: [name] Entered By: [name] on 07/25/2019 15:09:25

## 2019-07-29 NOTE — Progress Notes (Addendum)
[name], [name] ([id number]) Visit Report for 07/28/2019 SuperBill Details Patient Name: Date of Service: [place], [name] 07/28/2019 Medical Record Number: [id number] Patient Account Number: [id number] Date of Birth/Sex: Treating RN: [birth year] (86 y.o. 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

## 2019-07-29 NOTE — Progress Notes (Addendum)
[name], [name] ([id number]) Visit Report for 07/28/2019 HBO Details Patient Name: Date of Service: [name], [name] 07/28/2019 3:00 PM Medical Record Number: [id number] Patient Account Number: [id number] Date of Birth/Sex: Treating RN: 18-Feb-1929 (86 y.o. 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 HBO Treatment Details Treatment Number: 19 Patient Type: Outpatient Chamber Type: Monoplace Chamber Serial #: [id number] Treatment Protocol: 2.0 ATA with 90 minutes oxygen, and no air breaks Treatment Details Compression Rate Down: 2.0 psi / minute De-Compression Rate Up: 3.0 psi / minute Air breaks and breathing Decompress Decompress Compress Tx Pressure Begins Reached periods Begins Ends (leave unused spaces blank) Chamber Pressure (ATA 1 2 ------2 1 ) Clock Time (24 hr) 14:58 15:06 - - - - - - 16:36 16:46 Treatment Length: 108 (minutes) Treatment Segments: 4 Vital Signs Capillary Blood Glucose Reference Range: 80 - 120 mg / dl HBO Diabetic Blood Glucose Intervention Range: <131 mg/dl or >249 mg/dl Time Vitals Blood Respiratory Capillary Blood Glucose Pulse Action Type: Pulse: Temperature: Taken: Pressure: Rate: Glucose (mg/dl): Meter #: Oximetry (%) Taken: Pre 14:45 115/55 73 14 98 Post 16:48 122/76 56 12 98.1 Treatment Response Treatment Toleration: Well Treatment Completion Status: Treatment Completed without Adverse Event Marielena Harvell Notes No concerns with treatment given Physician HBO Attestation: I certify that I supervised this HBO treatment in accordance with Medicare guidelines. A trained emergency  response team is readily available per Yes hospital policies and procedures. Continue HBOT as ordered. Yes Electronic Signature(s) Signed: 07/29/2019 8:12:39 AM By: Linton Ham MD Entered By: Linton Ham on 07/28/2019 17:25:12 -------------------------------------------------------------------------------- HBO Safety Checklist Details Patient Name: Date of Service: [facility], [name] 07/28/2019 3:00 PM Medical Record Number: [id number] Patient Account Number: [id number] Date of Birth/Sex: Treating RN: Jun 24, 1929 (86 y.o. [name] Primary Care Dima Ferrufino: PA [name], [place] Other Clinician: [name] Referring Jahsir Rama: Treating Kaion Tisdale/Extender: [name] in Treatment: 8 HBO Safety Checklist Items Safety Checklist Consent Form Signed Patient voided / foley secured and emptied When did you last eato N/A Last dose of injectable or oral agent Ostomy pouch emptied and vented if applicable NA All implantable devices assessed, documented and approved NA Intravenous access site secured and place NA Valuables secured Linens and cotton and cotton/polyester blend (less than 51% polyester) Personal oil-based products / skin lotions / body lotions removed Wigs or hairpieces removed Smoking or tobacco materials removed NA Books / newspapers / magazines / loose paper removed Cologne, aftershave, perfume and deodorant removed Jewelry removed (may wrap wedding band) Make-up removed Hair care products removed Battery operated devices (external) removed NA Heating patches and chemical warmers removed NA Titanium eyewear removed NA Nail polish cured greater than 10 hours NA Casting material cured greater than 10 hours NA Hearing aids removed NA Loose dentures or partials removed NA Prosthetics have been removed NA Patient demonstrates correct use of air break device (if applicable) Patient concerns have been addressed Patient grounding bracelet on  and cord attached to chamber Specifics for Inpatients (complete in addition to above) Medication sheet sent with patient Intravenous medications needed or due during therapy sent with patient Drainage tubes (e.g. nasogastric tube or chest tube secured and vented) Endotracheal or Tracheotomy tube secured Cuff deflated of air  and inflated with saline Airway suctioned Electronic Signature(s) Signed: 07/29/2019 8:39:23 AM By: [name] Entered By: [name] on 07/28/2019 14:51:40

## 2019-07-29 NOTE — Progress Notes (Addendum)
KASYN, STOUFFER ([id number]) Visit Report for 07/29/2019 Arrival Information Details Patient Name: Date of Service: [place], [name] 07/29/2019 3:00 PM Medical Record Number: [id number] Patient Account Number: [id number] Date of Birth/Sex: Treating RN: 03-20-1929 (86 y.o. [name] Primary Care Amirra Herling: PA [name], [name] Other Clinician: [name] Referring Lynk Marti: Treating Edna Grover/Extender: [name] in Treatment: 9 Visit Information History Since Last Visit Added or deleted any medications: No Patient Arrived: [name] Any new allergies or adverse reactions: No Arrival Time: 14:18 Had a fall or experienced change in No Accompanied By: self activities of daily living that may affect Transfer Assistance: None risk of falls: Patient Identification Verified: Yes Signs or symptoms of abuse/neglect since last visito No Secondary Verification Process Completed: Yes Hospitalized since last visit: No Patient Requires Transmission-Based Precautions: No Implantable device outside of the clinic excluding No Patient Has Alerts: No cellular tissue based products placed in the center since last visit: Pain Present Now: No Electronic Signature(s) Signed: 07/29/2019 5:10:59 PM By: [name] Entered By: [name] on 07/29/2019 17:05:04 -------------------------------------------------------------------------------- Encounter Discharge Information Details Patient Name: Date of Service: [name], [name] 07/29/2019 3:00 PM Medical Record Number: [id number] Patient Account Number: [id number] Date of Birth/Sex: Treating RN: May 12, 1929 (86 y.o. [name] Primary Care Ebbie Sorenson: PA [name], [name] Other Clinician: [name] Referring Jovon Winterhalter: Treating Linkyn Gobin/Extender: [name] in Treatment: 9 Encounter Discharge Information Items Discharge Condition: Stable Ambulatory Status: Walker Discharge Destination: Home Transportation: Private  Auto Accompanied By: self Schedule Follow-up Appointment: Yes Clinical Summary of Care: Patient Declined Electronic Signature(s) Signed: 07/29/2019 5:10:59 PM By: [name] Entered By: [name] on 07/29/2019 17:07:51 -------------------------------------------------------------------------------- Patient/Caregiver Education Details Patient Name: Date of Service: [name], [name] 7/13/2021andnbsp3:00 PM Medical Record Number: [id number] Patient Account Number: [id number] Date of Birth/Gender: Treating RN: April 08, 1929 (86 y.o. [name] Primary Care Physician: PA [name], [name] Other Clinician: [name] Referring Physician: Treating Physician/Extender: [name] in Treatment: 9 Education Assessment Education Provided To: Patient Education Topics Provided Hyperbaric Oxygenation: Methods: Explain/Verbal Responses: State content correctly Electronic Signature(s) Signed: 07/29/2019 5:10:59 PM By: [name] Entered By: [name] on 07/29/2019 17:07:37 -------------------------------------------------------------------------------- Vitals Details Patient Name: Date of Service: [name], [name] 07/29/2019 3:00 PM Medical Record Number: [id number] Patient Account Number: [id number] Date of Birth/Sex: Treating RN: 1929-06-13 (86 y.o. [name] Primary Care Elain Wixon: PA [name], [name] Other Clinician: [name] Referring Anisten Tomassi: Treating Vadhir Mcnay/Extender: [name] in Treatment: 9 Vital Signs Time Taken: 14:00 Temperature (F): 98.4 Height (in): 65 Pulse (bpm): 70 Weight (lbs): 138 Respiratory Rate (breaths/min): 16 Body Mass Index (BMI): 23 Blood Pressure (mmHg): 108/65 Reference Range: 80 - 120 mg / dl Electronic Signature(s) Signed: 07/29/2019 5:10:59 PM By: [name] Entered By: [name] on 07/29/2019 17:06:05

## 2019-07-29 NOTE — Progress Notes (Addendum)
[name], [name] ([id number]) Visit Report for 07/29/2019 Debridement Details Patient Name: Date of Service: [place], [name] 07/29/2019 2:15 PM Medical Record Number: [id number] Patient Account Number: [id number] Date of Birth/Sex: Treating RN: December 27, 1929 (86 y.o. [name] Primary Care Provider: PA [name], [name] Other Clinician: Referring Provider: Treating Provider/Extender: [name] in Treatment: 9 Debridement Performed for Assessment: Wound #1 Left,Lateral Lower Leg Performed By: Physician Ricard Dillon., MD Debridement Type: Debridement Level of Consciousness (Pre-procedure): Awake and Alert Pre-procedure Verification/Time Out Yes - 14:45 Taken: Start Time: 14:45 Pain Control: Lidocaine 5% topical ointment T Area Debrided (L x W): otal 7.8 (cm) x 4 (cm) = 31.2 (cm) Tissue and other material debrided: Viable, Non-Viable, Slough, Subcutaneous, Skin: Dermis , Skin: Epidermis, Slough Level: Skin/Subcutaneous Tissue Debridement Description: Excisional Instrument: Curette Bleeding: Moderate Hemostasis Achieved: Pressure End Time: 14:49 Procedural Pain: 0 Post Procedural Pain: 0 Response to Treatment: Procedure was tolerated well Level of Consciousness (Post- Awake and Alert procedure): Post Debridement Measurements of Total Wound Length: (cm) 7.8 Width: (cm) 4 Depth: (cm) 0.5 Volume: (cm) 12.252 Character of Wound/Ulcer Post Debridement: Improved Post Procedure Diagnosis Same as Pre-procedure Electronic Signature(s) Signed: 07/29/2019 5:16:39 PM By: Linton Ham MD Signed: 07/29/2019 5:57:23 PM By: Carlene Coria RN Entered By: Linton Ham on 07/29/2019 14:51:12 -------------------------------------------------------------------------------- HPI Details Patient Name: Date of Service: [name], [name] 07/29/2019 2:15 PM Medical Record Number: [id number] Patient Account Number: [id number] Date of Birth/Sex: Treating RN: [birth year] (86  y.o. [name] Primary Care Provider: PA [name], [name] Other Clinician: Referring Provider: Treating Provider/Extender: [name] in Treatment: 9 History of Present Illness HPI Description: ADMISSION 05/27/2019 This is an 86 year old woman who came here for consideration of hyperbaric oxygen. She has a very complicated history. She is been treated for diffuse large B-cell lymphoma since 2006. She has had 2 recurrences of this. Her most recent chemotherapy round was Treanda plus polatuzumab which ended I believe in February. She is not currently on chemotherapy. She came to see [place] with a large wound on the left lateral lower leg. Apparently this started as a lump sometime in October 2020. She received courses of antibiotics which did not help. She then had a biopsy and it actually showed lymphoma. The wound never really healed. And she developed a progressive ulcerated tumor. She underwent a course of radiation in late October into November 2020. This resulted in some improvement in the mass but she has been left with a deep punched out wound with exposed tendon. She was seen by Dr. Jaclynn Guarneri at the wound care center in [facility]. She considered her for operative debridement and/or consideration of hyperbaric oxygen. She was also seen in April on 05/07/2019 by Dr. Zigmund Daniel at the wound care center in [facility]. Arterial and venous reflux studies were ordered but I am unable to see these results. 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 She has been using [name] for a prolonged period of time for perhaps 5 weeks with some improvement in the wound surface. She has not had the wound rebiopsied but she did have a PET scan on 03/18/2027 that did show  residual infiltrating soft tissue density involving the left lower calf but without discrete measurable mass and minimal uptake. I  think this is felt to lend credibility to the fact that the primary mechanism here is radiation injury. At the same time there was no new areas of metabolically active lymphoma identified. She therefore has not been on any ongoing chemotherapy. She also had a CT scan of the left lower extremity on 05/21/2019 that did not show any fracture or dislocation of the left tibia or fibula large soft tissue ulcer overlying the distal tibia no evidence of bony erosion or sclerosis to suggest osteomyelitis. 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 , The patient has had prior recent arterial studies we did not do an ABI in this clinic today. 5/18; wound looks about the same. Certainly no improvement in condition of the wound bed. She has been using [name] change daily. Deep necrotic wound on the left lateral lower calf 6/1; absolutely no change in this wound. Completely nonviable surface with tendon in the middle. She ran out of [name] last week in my absence and has been using what sounds like Vache solution. I will refax the [name] to [facility] I have her echocardiogram report. Her valve area is 1.25 mild to moderate AS. I think this is slightly worse than 3 years ago but this should not preclude her treatment with hyperbarics. I managed to creep in Dr. Kennon Holter interaction with his own nurse with regards to this question and I think he is approved that. I will text him just to make sure 6/15; some improvement in the wound surface using [name] with daily dressing changes. She is washing this off daily. Patient started hyperbarics today. 6/29; patient seen today in conjunction with HBO. She is doing dive #11 today. 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 She is midway through her course. 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 She has been using [name]. Making excellent improvement. She is tolerating HBO well Electronic Signature(s) Signed: 07/29/2019 5:16:39 PM By: Linton Ham MD Entered By: Linton Ham on 07/29/2019 14:51:45 -------------------------------------------------------------------------------- Physical Exam Details Patient Name: Date of Service: [name], [name] 07/29/2019 2:15 PM Medical Record Number: [id number] Patient Account Number: [id number] Date of Birth/Sex: Treating RN: Feb 11, 1929 (86 y.o. [name] Primary Care Provider: PA [name], [name] Other Clinician: Referring Provider: Treating Provider/Extender: [name] in Treatment: 9 Constitutional Sitting or standing Blood Pressure is within target range for patient.. Pulse regular and within target range for patient.Marland Kitchen Respirations regular, non-labored and within target range.. Temperature is normal and within the target range for the patient.Marland Kitchen Appears in no distress. Notes Wound exam; large punched-out area on the left mid calf. There is still exposed tendon. Using a #5 curette easy debridement of thick subcutaneous tissue off the wound. The wound margins in the center of the wound cleaned up quite nicely. Hemostasis with direct pressure there is been a marked improvement in the wound surface Electronic Signature(s) Signed: 07/29/2019 5:16:39 PM By: Linton Ham MD Entered By: Linton Ham on 07/29/2019 14:52:28 -------------------------------------------------------------------------------- Physician Orders Details Patient Name: Date of Service: [name], [name] 07/29/2019 2:15 PM Medical Record Number: [id number] Patient Account Number: [id number] Date of Birth/Sex: Treating RN: 11-02-1929 (86 y.o. [name] Primary Care  Provider: PA [name], [name] Other Clinician: Referring Provider: Treating Provider/Extender: [name] in Treatment: 9 Verbal /  Phone Orders: No Diagnosis Coding ICD-10 Coding Code Description [phone] Non-pressure chronic ulcer of left calf with necrosis of muscle L59.9 Disorder of the skin and subcutaneous tissue related to radiation, unspecified C83.30 Diffuse large B-cell lymphoma, unspecified site Follow-up Appointments Return Appointment in 2 weeks. Dressing Change Frequency Change dressing every day. Wound Cleansing Wound #1 Left,Lateral Lower Leg May shower and wash wound with soap and water. Primary Wound Dressing [name] Ointment Secondary Dressing Kerlix/Rolled Gauze - secure with tape Dry Gauze Hyperbaric Oxygen Therapy Indication: - soft tissue radiation necrosis If appropriate for treatment, begin HBOT per protocol: 2.0 ATA for 90 Minutes without A Breaks ir Total Number of Treatments: - 40 treatments One treatments per day (delivered Monday through Friday unless otherwise specified in Special Instructions below): Electronic Signature(s) Signed: 07/29/2019 5:16:39 PM By: Linton Ham MD Signed: 07/29/2019 5:57:23 PM By: Carlene Coria RN Entered By: Carlene Coria on 07/29/2019 14:28:04 -------------------------------------------------------------------------------- Problem List Details Patient Name: Date of Service: [facility], [name] 07/29/2019 2:15 PM Medical Record Number: [id number] Patient Account Number: [id number] Date of Birth/Sex: Treating RN: 14-Apr-1929 (86 y.o. [name] Primary Care Provider: PA [name], [name] Other Clinician: Referring Provider: Treating Provider/Extender: [name] in Treatment: 9 Active Problems ICD-10 Encounter Encounter Code Description Active Date MDM Diagnosis L97.223 Non-pressure chronic ulcer of left calf with necrosis of muscle 05/27/2019 No Yes L59.9 Disorder of the skin and subcutaneous  tissue related to radiation, unspecified 05/27/2019 No Yes C83.30 Diffuse large B-cell lymphoma, unspecified site 05/27/2019 No Yes Inactive Problems Resolved Problems Electronic Signature(s) Signed: 07/29/2019 5:16:39 PM By: Linton Ham MD Entered By: Linton Ham on 07/29/2019 14:50:46 -------------------------------------------------------------------------------- Progress Note Details Patient Name: Date of Service: [name], [name] 07/29/2019 2:15 PM Medical Record Number: [id number] Patient Account Number: [id number] Date of Birth/Sex: Treating RN: [birth year] (86 y.o. [name] Primary Care Provider: PA [name], [name] Other Clinician: Referring Provider: Treating Provider/Extender: [name] in Treatment: 9 Subjective History of Present Illness (HPI) ADMISSION 05/27/2019 This is an 86 year old woman who came here for consideration of hyperbaric oxygen. She has a very complicated history. She is been treated for diffuse large B-cell lymphoma since 2006. She has had 2 recurrences of this. Her most recent chemotherapy round was Treanda plus polatuzumab which ended I believe in February. She is not currently on chemotherapy. She came to see [place] with a large wound on the left lateral lower leg. Apparently this started as a lump sometime in October 2020. She received courses of antibiotics which did not help. She then had a biopsy and it actually showed lymphoma. The wound never really healed. And she developed a progressive ulcerated tumor. She underwent a course of radiation in late October into November 2020. This resulted in some improvement in the mass but she has been left with a deep punched out wound with exposed tendon. She was seen by Dr. Jaclynn Guarneri at the wound care center in [facility]. She considered her for operative debridement and/or consideration of hyperbaric oxygen. She was also seen in April on 05/07/2019 by Dr. Zigmund Daniel at the wound care center in [facility]. Arterial and venous reflux studies were ordered but I am unable to see these results. I think preparations were being made for hyperbaric oxygen therapy but the patient lives in [place] and so she arrives here for consideration of hyperbaric oxygen for soft tissue radiation injury Other factors in this is that she does not tolerate compression because of pain. She has  been using [name] for a prolonged period of time for perhaps 5 weeks with some improvement in the wound surface. She has not had the wound rebiopsied but she did have a PET scan on 03/18/2027 that did show residual infiltrating soft tissue density involving the left lower calf but without discrete measurable mass and minimal uptake. 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 At the same time there was no new areas of metabolically active lymphoma identified. She therefore has not been on any ongoing chemotherapy. She also had a CT scan of the left lower extremity on 05/21/2019 that did not show any fracture or dislocation of the left tibia or fibula large soft tissue ulcer overlying the distal tibia no evidence of bony erosion or sclerosis to suggest osteomyelitis. 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 , The patient has had prior recent arterial studies we did not do an ABI in this clinic today. 5/18; wound looks about the same. Certainly no improvement in condition of the wound bed. She has been using [name] change daily. Deep necrotic wound on the left lateral lower calf 6/1; absolutely no change in this wound. Completely nonviable surface with tendon in the middle. She ran out of [name] last week in my absence and has been using what sounds like Vache solution. I will refax the [name] to [facility] I have her echocardiogram report. Her valve  area is 1.25 mild to moderate AS. I think this is slightly worse than 3 years ago but this should not preclude her treatment with hyperbarics. I managed to creep in Dr. Kennon Holter interaction with his own nurse with regards to this question and I think he is approved that. I will text him just to make sure 6/15; some improvement in the wound surface using [name] with daily dressing changes. She is washing this off daily. Patient started hyperbarics today. 6/29; patient seen today in conjunction with HBO. She is doing dive #11 today. 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 She is midway through her course. 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 She has been using [name]. Making excellent improvement. She is tolerating HBO well Objective Constitutional Sitting or standing Blood Pressure is within target range for patient.. Pulse regular and within target range for patient.Marland Kitchen Respirations regular, non-labored and within target range.. Temperature is normal and within the target range for the patient.Marland Kitchen Appears in no distress. Vitals Time Taken: 2:45 PM, Height: 65 in, Weight: 138 lbs, BMI: 23, Temperature: 98.4 F, Pulse: 70 bpm, Respiratory Rate: 16 breaths/min, Blood Pressure: 108/65 mmHg. General Notes: Wound exam; large punched-out area on the left mid calf. There is still exposed tendon. Using a #5 curette easy debridement of thick subcutaneous tissue off the wound. The wound margins in the center of the wound cleaned up quite nicely. Hemostasis with direct pressure there is been a marked improvement in the wound surface Integumentary (Hair, Skin) Wound #1 status is Open. Original cause of wound was Bump. The wound is located on the Left,Lateral Lower Leg. The wound measures 7.6cm length x 4cm width x 0.5cm depth; 23.876cm^2 area and 11.938cm^3 volume. There is muscle, tendon, and Fat  Layer (Subcutaneous Tissue) Exposed exposed. There is no tunneling or undermining  noted. There is a medium amount of serous drainage noted. The wound margin is distinct with the outline attached to the wound base. There is small (1-33%) pink granulation within the wound bed. There is a large (67-100%) amount of necrotic tissue within the wound bed including Adherent Slough. 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 There was a Excisional Skin/Subcutaneous Tissue Debridement with a total area of 31.2 sq cm performed by Ricard Dillon., MD. With the following instrument(s): Curette to remove Viable and Non-Viable tissue/material. Material removed includes Subcutaneous Tissue, Slough, Skin: Dermis, and Skin: Epidermis after achieving pain control using Lidocaine 5% topical ointment. No specimens were taken. A time out was conducted at 14:45, prior to the start of the procedure. A Moderate amount of bleeding was controlled with Pressure. The procedure was tolerated well with a pain level of 0 throughout and a pain level of 0 following the procedure. Post Debridement Measurements: 7.8cm length x 4cm width x 0.5cm depth; 12.252cm^3 volume. Character of Wound/Ulcer Post Debridement is improved. Post procedure Diagnosis Wound #1: Same as Pre-Procedure Plan Follow-up Appointments: Return Appointment in 2 weeks. Dressing Change Frequency: Change dressing every day. Wound Cleansing: Wound #1 Left,Lateral Lower Leg: May shower and wash wound with soap and water. Primary Wound Dressing: [name] Ointment Secondary Dressing: Kerlix/Rolled Gauze - secure with tape Dry Gauze Hyperbaric Oxygen  Therapy: Indication: - soft tissue radiation necrosis If appropriate for treatment, begin HBOT per protocol: 2.0 ATA for 90 Minutes without Air Breaks T Number of Treatments: - 40 treatments otal One treatments per day (delivered Monday through Friday unless otherwise specified in Special Instructions below): 1. Continue with [name]-based dressings 2. The patient is changing this daily 3. Continue HBO she is tolerating this well 4. I think we should probably move towards an different dressing probably in the next 2 weeks. High certainly could consider a plastic surgery referral Electronic Signature(s) Signed: 07/29/2019 5:16:39 PM By: Linton Ham MD Entered By: Linton Ham on 07/29/2019 14:53:13 -------------------------------------------------------------------------------- SuperBill Details Patient Name: Date of Service: [facility], [name] 07/29/2019 Medical Record Number: [id number] Patient Account Number: [id number] Date of Birth/Sex: Treating RN: 1929-08-29 (86 y.o. 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 Modifier: Quantity: 1 CPT4 Code: [id number] Description: 16967 - DEB SUBQ TISS EA ADDL 20CM ICD-10 Diagnosis Description 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

## 2019-07-29 NOTE — Progress Notes (Addendum)
[name], [name] ([id number]) Visit Report for 07/28/2019 Arrival Information Details Patient Name: Date of Service: [name], [name] 07/28/2019 3:00 PM Medical Record Number: [id number] Patient Account Number: [id number] Date of Birth/Sex: Treating RN: 29-Mar-1929 (86 y.o. [name] Primary Care Tymeshia Awan: PA [name], [place] Other Clinician: Minerva [name] Referring Betzabe Bevans: Treating Chitara Clonch/Extender: [name] in Treatment: 8 Visit Information History Since Last Visit Added or deleted any medications: No Patient Arrived: [name] Any new allergies or adverse reactions: No Arrival Time: 14:42 Had a fall or experienced change in No Accompanied By: self activities of daily living that may affect Transfer Assistance: None risk of falls: Patient Identification Verified: Yes Signs or symptoms of abuse/neglect since last visito No Secondary Verification Process Completed: Yes Hospitalized since last visit: No Patient Requires Transmission-Based Precautions: No Implantable device outside of the clinic excluding No Patient Has Alerts: No cellular tissue based products placed in the center since last visit: Pain Present Now: No Electronic Signature(s) Signed: 07/29/2019 8:39:23 AM By: [name] Entered By: [name] on 07/28/2019 14:50:55 -------------------------------------------------------------------------------- Encounter Discharge Information Details Patient Name: Date of Service: Sara RER, [name] 07/28/2019 3:00 PM Medical Record Number: [id number] Patient Account Number: [id number] Date of Birth/Sex: Treating RN: Dec 09, 1929 (86 y.o. [name] Primary Care Deniesha Stenglein: PA Darnelle Spangle Other Clinician: Minerva [name] Referring Weiland Tomich: Treating Abdifatah Colquhoun/Extender: [name] in Treatment: 8 Encounter Discharge Information Items Discharge Condition: Stable Ambulatory Status: Walker Discharge Destination: Home Transportation:  Private Auto Accompanied By: self Schedule Follow-up Appointment: Yes Clinical Summary of Care: Patient Declined Electronic Signature(s) Signed: 07/29/2019 8:39:23 AM By: [name] Entered By: [name] on 07/28/2019 17:06:01 -------------------------------------------------------------------------------- Patient/Caregiver Education Details Patient Name: Date of Service: Sara Benne, [name] 7/12/2021andnbsp3:00 PM Medical Record Number: [id number] Patient Account Number: [id number] Date of Birth/Gender: Treating RN: 13-Oct-1929 (86 y.o. [name] Primary Care Physician: PA [name], [name] Other Clinician: Minerva [name] Referring Physician: Treating Physician/Extender: [name] in Treatment: 8 Education Assessment Education Provided To: Patient Education Topics Provided Hyperbaric Oxygenation: Methods: Explain/Verbal Responses: State content correctly Electronic Signature(s) Signed: 07/29/2019 8:39:23 AM By: [name] Entered By: [name] on 07/28/2019 17:05:43 -------------------------------------------------------------------------------- Vitals Details Patient Name: Date of Service: Sara RER, [name] 07/28/2019 3:00 PM Medical Record Number: [id number] Patient Account Number: [id number] Date of Birth/Sex: Treating RN: 27-Feb-1929 (86 y.o. [name] Primary Care Lyndall Bellot: PA [name], [place] Other Clinician: Minerva [name] Referring Shandrika Ambers: Treating Stavros Cail/Extender: [name] in Treatment: 8 Vital Signs Time Taken: 14:45 Temperature (F): 98.0 Height (in): 65 Pulse (bpm): 73 Weight (lbs): 138 Respiratory Rate (breaths/min): 14 Body Mass Index (BMI): 23 Blood Pressure (mmHg): 115/55 Reference Range: 80 - 120 mg / dl Electronic Signature(s) Signed: 07/29/2019 8:39:23 AM By: [name] Entered By: [name] on 07/28/2019 14:51:13

## 2019-07-29 NOTE — Progress Notes (Addendum)
DANNON, PERLOW ([id number]) Visit Report for 07/25/2019 SuperBill Details Patient Name: Date of Service: [name], [name] 07/25/2019 Medical Record Number: [id number] Patient Account Number: [id number] Date of Birth/Sex: Treating RN: December 17, 1929 (86 y.o. 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

## 2019-07-30 ENCOUNTER — Encounter (HOSPITAL_BASED_OUTPATIENT_CLINIC_OR_DEPARTMENT_OTHER): Payer: Medicare Other | Admitting: Physician Assistant

## 2019-07-30 NOTE — Progress Notes (Signed)
[name], [name] ([id number]) Visit Report for 07/25/2019 Arrival Information Details Patient Name: Date of Service: [place], [name] 07/25/2019 3:00 PM Medical Record Number: [id number] Patient Account Number: [id number] Date of Birth/Sex: Treating RN: 09-21-1929 (86 y.o. [name] Primary Care Maurene Hollin: PA [name], [name] Other Clinician: [name] Referring Kennah Hehr: Treating Ahijah Devery/Extender: [name] in Treatment: 8 Visit Information History Since Last Visit Added or deleted any medications: No Patient Arrived: [name] Any new allergies or adverse reactions: No Arrival Time: 14:40 Had a fall or experienced change in No Accompanied By: self activities of daily living that may affect Transfer Assistance: None risk of falls: Patient Identification Verified: Yes Signs or symptoms of abuse/neglect since last visito No Secondary Verification Process Completed: Yes Hospitalized since last visit: No Patient Requires Transmission-Based Precautions: No Implantable device outside of the clinic excluding No Patient Has Alerts: No cellular tissue based products placed in the center since last visit: Pain Present Now: No Electronic Signature(s) Signed: 07/28/2019 9:27:12 AM By: [name] Entered By: [name] on 07/25/2019 15:07:56 -------------------------------------------------------------------------------- Encounter Discharge Information Details Patient Name: Date of Service: Sara RER, [name] 07/25/2019 3:00 PM Medical Record Number: [id number] Patient Account Number: [id number] Date of Birth/Sex: Treating RN: 05/16/1929 (86 y.o. [name] Primary Care Raquel Racey: PA [name], [name] Other Clinician: [name] Referring Jenavi Beedle: Treating Thaily Hackworth/Extender: [name] in Treatment: 8 Encounter Discharge Information Items Discharge Condition: Stable Ambulatory Status: Walker Discharge Destination: Home Transportation:  Private Auto Accompanied By: self Schedule Follow-up Appointment: Yes Clinical Summary of Care: Patient Declined Electronic Signature(s) Signed: 07/28/2019 9:27:12 AM By: [name] Entered By: [name] on 07/25/2019 16:40:25 -------------------------------------------------------------------------------- Patient/Caregiver Education Details Patient Name: Date of Service: Sara Benne, [name] 7/9/2021andnbsp3:00 PM Medical Record Number: [id number] Patient Account Number: [id number] Date of Birth/Gender: Treating RN: 02-03-1929 (86 y.o. [name] Primary Care Physician: PA [name], [name] Other Clinician: [name] Referring Physician: Treating Physician/Extender: [name] in Treatment: 8 Education Assessment Education Provided To: Patient Education Topics Provided Hyperbaric Oxygenation: Methods: Explain/Verbal Responses: State content correctly Electronic Signature(s) Signed: 07/28/2019 9:27:12 AM By: [name] Entered By: [name] on 07/25/2019 16:40:09 -------------------------------------------------------------------------------- Vitals Details Patient Name: Date of Service: Sara RER, [name] 07/25/2019 3:00 PM Medical Record Number: [id number] Patient Account Number: [id number] Date of Birth/Sex: Treating RN: [birth year] (86 y.o. [name] Primary Care Peyson Postema: PA [name], [name] Other Clinician: [name] Referring Avi Archuleta: Treating Martavion Couper/Extender: [name] in Treatment: 8 Vital Signs Time Taken: 14:42 Temperature (F): 97.8 Height (in): 65 Pulse (bpm): 70 Weight (lbs): 138 Respiratory Rate (breaths/min): 14 Body Mass Index (BMI): 23 Blood Pressure (mmHg): 109/64 Reference Range: 80 - 120 mg / dl Electronic Signature(s) Signed: 07/28/2019 9:27:12 AM By: [name] Entered By: [name] on 07/25/2019 15:09:01

## 2019-07-31 ENCOUNTER — Other Ambulatory Visit: Payer: Self-pay

## 2019-07-31 ENCOUNTER — Encounter (HOSPITAL_BASED_OUTPATIENT_CLINIC_OR_DEPARTMENT_OTHER): Payer: Medicare Other | Admitting: Internal Medicine

## 2019-07-31 DIAGNOSIS — L598 Other specified disorders of the skin and subcutaneous tissue related to radiation: Secondary | ICD-10-CM | POA: Diagnosis not present

## 2019-07-31 NOTE — Progress Notes (Signed)
ADISA, VIGEANT ([id number]) Visit Report for 07/31/2019 HBO Details Patient Name: Date of Service: [name], [name] 07/31/2019 3:00 PM Medical Record Number: [id number] Patient Account Number: [id number] Date of Birth/Sex: Treating RN: [birth year] (86 y.o. 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 HBO Treatment Details Treatment Number: 21 Patient Type: Outpatient Chamber Type: Monoplace Chamber Serial #: [id number] Treatment Protocol: 2.0 ATA with 90 minutes oxygen, and no air breaks Treatment Details Compression Rate Down: 2.0 psi / minute De-Compression Rate Up: 3.0 psi / minute Air breaks and breathing Decompress Decompress Compress Tx Pressure Begins Reached periods Begins Ends (leave unused spaces blank) Chamber Pressure (ATA 1 2 ------2 1 ) Clock Time (24 hr) 15:07 15:15 - - - - - - 16:45 16:53 Treatment Length: 106 (minutes) Treatment Segments: 4 Vital Signs Capillary Blood Glucose Reference Range: 80 - 120 mg / dl HBO Diabetic Blood Glucose Intervention Range: <131 mg/dl or >249 mg/dl Time Vitals Blood Respiratory Capillary Blood Glucose Pulse Action Type: Pulse: Temperature: Taken: Pressure: Rate: Glucose (mg/dl): Meter #: Oximetry (%) Taken: Pre 14:52 98/59 67 15 98 Post 16:55 122/63 53 14 97.8 Treatment Response Treatment Toleration: Well Treatment Completion Status: Treatment Completed without Adverse Event [name] Notes No concerns with treatment given Physician HBO Attestation: I certify that I supervised this HBO treatment in accordance with Medicare guidelines. A trained emergency  response team is readily available per Yes hospital policies and procedures. Continue HBOT as ordered. Yes Electronic Signature(s) Signed: 07/31/2019 5:51:19 PM By: Sara Ham MD Previous Signature: 07/31/2019 5:27:30 PM Version By: [name] EMT/HBOT/SD Entered By: [name] on 07/31/2019 17:47:44 -------------------------------------------------------------------------------- HBO Safety Checklist Details Patient Name: Date of Service: [facility], [name] 07/31/2019 3:00 PM Medical Record Number: [id number] Patient Account Number: [id number] Date of Birth/Sex: Treating RN: 09-23-1929 (86 y.o. [name] Primary Care [name]: PA [name], [place] Other Clinician: Minerva [name] Referring [name]: Treating [name]/Extender: [name] in Treatment: 9 HBO Safety Checklist Items Safety Checklist Consent Form Signed Patient voided / foley secured and emptied When did you last eato N/A Last dose of injectable or oral agent n/a Ostomy pouch emptied and vented if applicable NA All implantable devices assessed, documented and approved NA Intravenous access site secured and place NA Valuables secured Linens and cotton and cotton/polyester blend (less than 51% polyester) Personal oil-based products / skin lotions / body lotions removed Wigs or hairpieces removed Smoking or tobacco materials removed NA Books / newspapers / magazines / loose paper removed Cologne, aftershave, perfume and deodorant removed Jewelry removed (may wrap wedding band) Make-up removed Hair care products removed Battery operated devices (external) removed NA Heating patches and chemical warmers removed NA Titanium eyewear removed NA Nail polish cured greater than 10 hours NA Casting material cured greater than 10 hours NA Hearing aids removed NA Loose dentures or partials removed NA Prosthetics have been removed NA Patient demonstrates correct use of air break device  (if applicable) Patient concerns have been addressed Patient grounding bracelet on and cord attached to chamber Specifics for Inpatients (complete in addition to above) Medication sheet sent with patient Intravenous medications needed or due during therapy sent with patient Drainage tubes (e.g. nasogastric tube or chest tube secured  and vented) Endotracheal or Tracheotomy tube secured Cuff deflated of air and inflated with saline Airway suctioned Electronic Signature(s) Signed: 07/31/2019 5:27:30 PM By: [name] EMT/HBOT/SD Entered By: [name] on 07/31/2019 17:12:40

## 2019-07-31 NOTE — Progress Notes (Signed)
LASHELL, MOFFITT ([id number]) Visit Report for 07/31/2019 SuperBill Details Patient Name: Date of Service: [place], [name] 07/31/2019 Medical Record Number: [id number] Patient Account Number: [id number] Date of Birth/Sex: Treating RN: 1929/08/09 (86 y.o. 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

## 2019-08-01 ENCOUNTER — Encounter (HOSPITAL_BASED_OUTPATIENT_CLINIC_OR_DEPARTMENT_OTHER): Payer: Medicare Other | Admitting: Internal Medicine

## 2019-08-01 DIAGNOSIS — L598 Other specified disorders of the skin and subcutaneous tissue related to radiation: Secondary | ICD-10-CM | POA: Diagnosis not present

## 2019-08-04 ENCOUNTER — Encounter (HOSPITAL_BASED_OUTPATIENT_CLINIC_OR_DEPARTMENT_OTHER): Payer: Medicare Other | Admitting: Internal Medicine

## 2019-08-04 DIAGNOSIS — L598 Other specified disorders of the skin and subcutaneous tissue related to radiation: Secondary | ICD-10-CM | POA: Diagnosis not present

## 2019-08-04 NOTE — Progress Notes (Signed)
[name], [name] ([id number]) Visit Report for 07/31/2019 Arrival Information Details Patient Name: Date of Service: [name], [name] 07/31/2019 3:00 PM Medical Record Number: [id number] Patient Account Number: [id number] Date of Birth/Sex: Treating RN: 1929-11-30 (86 y.o. [name] Primary Care Kal Chait: PA [name], [place] Other Clinician: [name] Referring Kari Montero: Treating Brilynn Biasi/Extender: [name] in Treatment: 9 Visit Information History Since Last Visit Added or deleted any medications: No Patient Arrived: [name] Any new allergies or adverse reactions: No Arrival Time: 14:50 Had a fall or experienced change in No Accompanied By: self activities of daily living that may affect Transfer Assistance: None risk of falls: Patient Identification Verified: Yes Signs or symptoms of abuse/neglect since last visito No Secondary Verification Process Completed: Yes Hospitalized since last visit: No Patient Requires Transmission-Based Precautions: No Implantable device outside of the clinic excluding No Patient Has Alerts: No cellular tissue based products placed in the center since last visit: Pain Present Now: No Electronic Signature(s) Signed: 08/04/2019 10:45:18 AM By: [name] Entered By: [name] on 07/31/2019 15:25:30 -------------------------------------------------------------------------------- Encounter Discharge Information Details Patient Name: Date of Service: Sara RER, PA [name] A. 07/31/2019 3:00 PM Medical Record Number: [id number] Patient Account Number: [id number] Date of Birth/Sex: Treating RN: [birth year] (86 y.o. [name] Primary Care [name]: PA [name], [place] Other Clinician: Mikeal Hawthorne Referring Shyne Lehrke: Treating Laverda Stribling/Extender: [name] in Treatment: 9 Encounter Discharge Information Items Discharge Condition: Stable Ambulatory Status: Walker Discharge Destination: Home Transportation:  Private Auto Accompanied By: self Schedule Follow-up Appointment: Yes Clinical Summary of Care: Patient Declined Electronic Signature(s) Signed: 07/31/2019 5:27:30 PM By: Mikeal Hawthorne EMT/HBOT/SD Entered By: Mikeal Hawthorne on 07/31/2019 17:13:58 -------------------------------------------------------------------------------- Patient/Caregiver Education Details Patient Name: Date of Service: [name] [name] 7/15/2021andnbsp3:00 PM Medical Record Number: [id number] Patient Account Number: [id number] Date of Birth/Gender: Treating RN: 11-07-1929 (86 y.o. [name] Primary Care Physician: PA [name], [name] Other Clinician: Mikeal Hawthorne Referring Physician: Treating Physician/Extender: [name] in Treatment: 9 Education Assessment Education Provided To: Patient Education Topics Provided Hyperbaric Oxygenation: Methods: Explain/Verbal Responses: State content correctly Electronic Signature(s) Signed: 07/31/2019 5:27:30 PM By: Mikeal Hawthorne EMT/HBOT/SD Entered By: Mikeal Hawthorne on 07/31/2019 17:13:44 -------------------------------------------------------------------------------- Vitals Details Patient Name: Date of Service: Sara RER, PA [name] A. 07/31/2019 3:00 PM Medical Record Number: [id number] Patient Account Number: [id number] Date of Birth/Sex: Treating RN: 08/23/1929 (86 y.o. [name] Primary Care [name]: PA [name], [place] Other Clinician: [name] Referring [name]: Treating [name]/Extender: [name] in Treatment: 9 Vital Signs Time Taken: 14:52 Temperature (F): 98.0 Height (in): 65 Pulse (bpm): 67 Weight (lbs): 138 Respiratory Rate (breaths/min): 15 Body Mass Index (BMI): 23 Blood Pressure (mmHg): 98/59 Reference Range: 80 - 120 mg / dl Electronic Signature(s) Signed: 08/04/2019 10:45:18 AM By: [name] Entered By: [name] on 07/31/2019 15:25:57

## 2019-08-04 NOTE — Progress Notes (Signed)
Sara, [name] ([id number]) Visit Report for 08/01/2019 Arrival Information Details Patient Name: Date of Service: [place], [name] 08/01/2019 3:00 PM Medical Record Number: [id number] Patient Account Number: [id number] Date of Birth/Sex: Treating RN: 07/12/1929 (86 y.o. [name] Primary Care Dianna Deshler: PA [name], [name] Other Clinician: [name] Referring Shakera Ebrahimi: Treating Ameli Sangiovanni/Extender: [name] in Treatment: 9 Visit Information History Since Last Visit Added or deleted any medications: No Patient Arrived: [name] Any new allergies or adverse reactions: No Arrival Time: 14:35 Had a fall or experienced change in No Accompanied By: self activities of daily living that may affect Transfer Assistance: None risk of falls: Patient Identification Verified: Yes Signs or symptoms of abuse/neglect since last visito No Secondary Verification Process Completed: Yes Hospitalized since last visit: No Patient Requires Transmission-Based Precautions: No Implantable device outside of the clinic excluding No Patient Has Alerts: No cellular tissue based products placed in the center since last visit: Pain Present Now: No Electronic Signature(s) Signed: 08/04/2019 10:45:18 AM By: [name] Entered By: [name] on 08/01/2019 15:01:05 -------------------------------------------------------------------------------- Encounter Discharge Information Details Patient Name: Date of Service: Sara RER, [name] 08/01/2019 3:00 PM Medical Record Number: [id number] Patient Account Number: [id number] Date of Birth/Sex: Treating RN: 04-13-1929 (86 y.o. [name] Primary Care Alesana Magistro: PA [name], [name] Other Clinician: Mikeal Hawthorne Referring Anureet Bruington: Treating Jernie Schutt/Extender: [name] in Treatment: 9 Encounter Discharge Information Items Discharge Condition: Stable Ambulatory Status: Walker Discharge Destination:  Home Transportation: Private Auto Accompanied By: self Schedule Follow-up Appointment: Yes Clinical Summary of Care: Patient Declined Electronic Signature(s) Signed: 08/01/2019 5:36:16 PM By: Mikeal Hawthorne EMT/HBOT/SD Entered By: Mikeal Hawthorne on 08/01/2019 16:55:51 -------------------------------------------------------------------------------- Patient/Caregiver Education Details Patient Name: Date of Service: Sara Benne, [place] [name] 7/16/2021andnbsp3:00 PM Medical Record Number: [id number] Patient Account Number: [id number] Date of Birth/Gender: Treating RN: Feb 08, 1929 (86 y.o. [name] Primary Care Physician: PA [name], [name] Other Clinician: Mikeal Hawthorne Referring Physician: Treating Physician/Extender: [name] in Treatment: 9 Education Assessment Education Provided To: Patient Education Topics Provided Hyperbaric Oxygenation: Methods: Explain/Verbal Responses: State content correctly Electronic Signature(s) Signed: 08/01/2019 5:36:16 PM By: Mikeal Hawthorne EMT/HBOT/SD Entered By: Mikeal Hawthorne on 08/01/2019 16:55:28 -------------------------------------------------------------------------------- Vitals Details Patient Name: Date of Service: Sara RER, [name] 08/01/2019 3:00 PM Medical Record Number: [id number] Patient Account Number: [id number] Date of Birth/Sex: Treating RN: [birth year] (86 y.o. [name] Primary Care Berenis Corter: PA [name], [name] Other Clinician: [name] Referring Eris Hannan: Treating Fabiha Rougeau/Extender: [name] in Treatment: 9 Vital Signs Time Taken: 14:37 Temperature (F): 98.2 Height (in): 65 Pulse (bpm): 68 Weight (lbs): 138 Respiratory Rate (breaths/min): 14 Body Mass Index (BMI): 23 Blood Pressure (mmHg): 125/68 Reference Range: 80 - 120 mg / dl Electronic Signature(s) Signed: 08/04/2019 10:45:18 AM By: [name] Entered By: [name] on 08/01/2019 15:01:24

## 2019-08-04 NOTE — Progress Notes (Signed)
[name] ([id number]) Visit Report for 07/29/2019 Arrival Information Details Patient Name: Date of Service: [name], [name] 07/29/2019 2:15 PM Medical Record Number: [id number] Patient Account Number: [id number] Date of Birth/Sex: Treating RN: April 18, 1929 (86 y.o. [name] Primary Care Yi Haugan: [name] TIENT, NO Other Clinician: Referring Aliviah Spain: Treating Nesbit Michon/Extender: [name] in Treatment: 9 Visit Information History Since Last Visit Added or deleted any medications: No Patient Arrived: Walker Any new allergies or adverse reactions: No Arrival Time: 14:44 Had a fall or experienced change in No Accompanied By: alone activities of daily living that may affect Transfer Assistance: None risk of falls: Patient Identification Verified: Yes Signs or symptoms of abuse/neglect since last visito No Secondary Verification Process Completed: Yes Hospitalized since last visit: No Patient Requires Transmission-Based Precautions: No Implantable device outside of the clinic excluding No Patient Has Alerts: No cellular tissue based products placed in the center since last visit: Has Dressing in Place as Prescribed: Yes Pain Present Now: Yes Electronic Signature(s) Signed: 08/04/2019 4:12:59 PM By: Levan Hurst RN, BSN Entered By: Levan Hurst on 07/29/2019 14:45:00 -------------------------------------------------------------------------------- Encounter Discharge Information Details Patient Name: Date of Service: [name], [name] Sara A. 07/29/2019 2:15 PM Medical Record Number: [id number] Patient Account Number: [id number] Date of Birth/Sex: Treating RN: Jun 08, 1929 (86 y.o. [name] Primary Care Sora Vrooman: [name] [name], [name] Other Clinician: Referring Margaret Staggs: Treating Darwin Rothlisberger/Extender: [name] in Treatment: 9 Encounter Discharge Information Items Post Procedure Vitals Discharge Condition: Stable Temperature (F):  98.4 Ambulatory Status: Walker Pulse (bpm): 70 Discharge Destination: Home Respiratory Rate (breaths/min): 16 Transportation: Private Auto Blood Pressure (mmHg): 108/65 Accompanied By: self Schedule Follow-up Appointment: Yes Clinical Summary of Care: Patient Declined Electronic Signature(s) Signed: 07/29/2019 5:18:51 PM By: Kela Millin Entered By: Kela Millin on 07/29/2019 15:04:44 -------------------------------------------------------------------------------- Lower Extremity Assessment Details Patient Name: Date of Service: [name], [name] Sara A. 07/29/2019 2:15 PM Medical Record Number: [id number] Patient Account Number: [id number] Date of Birth/Sex: Treating RN: Feb 17, 1929 (86 y.o. [name] Primary Care Oswin Griffith: [name] [name], [place] Other Clinician: Referring Moneisha Vosler: Treating Caliope Ruppert/Extender: [name] in Treatment: 9 Edema Assessment Assessed: [Left: No] [Right: No] Edema: [Left: Ye] [Right: s] Calf Left: Right: Point of Measurement: cm From Medial Instep 33 cm cm Ankle Left: Right: Point of Measurement: cm From Medial Instep 19 cm cm Vascular Assessment Pulses: Dorsalis Pedis Palpable: [Left:Yes] Electronic Signature(s) Signed: 08/04/2019 4:12:59 PM By: Levan Hurst RN, BSN Entered By: Levan Hurst on 07/29/2019 14:45:32 -------------------------------------------------------------------------------- Multi Wound Chart Details Patient Name: Date of Service: [name], [name] Sara A. 07/29/2019 2:15 PM Medical Record Number: [id number] Patient Account Number: [id number] Date of Birth/Sex: Treating RN: 07-08-1929 (86 y.o. 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] [N/A:N/A] Primary Etiology: [1:Cataracts, Deep Vein Thrombosis,] [N/A:N/A] Comorbid History: [1:Hypertension, Osteoarthritis, Neuropathy, Received Chemotherapy, Received Radiation, Confinement Anxiety 10/17/2018] [N/A:N/A] Date Acquired: [1:9] [N/A:N/A] Weeks of Treatment: [1:Open] [N/A:N/A] Wound Status: [1:7.6x4x0.5] [N/A:N/A] Measurements L x W x D (cm) [1:23.876] [N/A:N/A] A (cm) : rea [1:11.938] [N/A:N/A] Volume (cm) : [1:-20.80%] [N/A:N/A] % Reduction in Area: [1:-51.00%] [N/A:N/A] % Reduction in Volume: [1:Full Thickness With Exposed Support] [N/A:N/A] Classification: [1:Structures Medium] [N/A:N/A] Exudate A mount: [1:Serous] [N/A:N/A] Exudate  Type: [1:amber] [N/A:N/A] Exudate Color: [1:Distinct, outline attached] [N/A:N/A] Wound Margin: [1:Small (1-33%)] [N/A:N/A] Granulation A mount: [1:Pink] [N/A:N/A] Granulation Quality: [1:Large (67-100%)] [N/A:N/A] Necrotic A mount: [1:Fat Layer (Subcutaneous Tissue)] [N/A:N/A] Exposed Structures: [1:Exposed: Yes Tendon: Yes Muscle: Yes Fascia: No Joint: No Bone: No Small (1-33%)] [N/A:N/A] Epithelialization: [1:Debridement - Excisional] [N/A:N/A] Debridement: Pre-procedure Verification/Time Out 14:45 [N/A:N/A] Taken: [1:Lidocaine 5% topical ointment] [N/A:N/A] Pain Control: [1:Subcutaneous, Slough] [N/A:N/A] Tissue Debrided: [1:Skin/Subcutaneous Tissue] [N/A:N/A] Level: [1:31.2] [N/A:N/A] Debridement A (sq cm): [1:rea Curette] [N/A:N/A] Instrument: [1:Moderate] [N/A:N/A] Bleeding: [1:Pressure] [N/A:N/A] Hemostasis A chieved: [1:0] [N/A:N/A] Procedural Pain: [1:0] [N/A:N/A] Post Procedural Pain: [1:Procedure was tolerated well] [N/A:N/A] Debridement Treatment Response: [1:7.8x4x0.5] [N/A:N/A] Post Debridement Measurements L x W x D (cm) [1:12.252] [N/A:N/A] Post  Debridement Volume: (cm) [1:Debridement] [N/A:N/A] Treatment Notes Electronic Signature(s) Signed: 07/29/2019 5:16:39 PM By: Linton Ham MD Signed: 07/29/2019 5:57:23 PM By: Carlene Coria RN Entered By: Linton Ham on 07/29/2019 14:50:53 -------------------------------------------------------------------------------- Multi-Disciplinary Care Plan Details Patient Name: Date of Service: [place], [name] Sara A. 07/29/2019 2:15 PM Medical Record Number: [id number] Patient Account Number: [id number] Date of Birth/Sex: Treating RN: [birth year] (86 y.o. [name] Primary Care Martavia Tye: [name] [name], [name] Other Clinician: Referring Armani Brar: Treating Kierstynn Babich/Extender: [name] in Treatment: 9 Active Inactive HBO Nursing Diagnoses: Anxiety related to feelings of confinement associated with the hyperbaric oxygen chamber Anxiety related to knowledge deficit of hyperbaric oxygen therapy and treatment procedures Goals: Patient and/or family will be able to state/discuss factors appropriate to the management of their disease process during treatment Date Initiated: 05/27/2019 T arget Resolution Date: 06/27/2019 Goal Status: Active Patient will tolerate the hyperbaric oxygen therapy treatment Date Initiated: 05/27/2019 T arget Resolution Date: 06/27/2019 Goal Status: Active Patient will tolerate the internal climate of the chamber Date Initiated: 05/27/2019 T arget Resolution Date: 06/27/2019 Goal Status: Active Patient/caregiver will verbalize understanding of HBO goals, rationale, procedures and potential hazards Date Initiated: 05/27/2019 T arget Resolution Date: 06/27/2019 Goal Status: Active Interventions: Administer a five (5) minute air break for patient if signs and symptoms of seizure appear and notify the hyperbaric physician Administer decongestants, per physician orders, prior to HBO2 Administer the correct therapeutic gas delivery based on the patients needs and  limitations, per physician order Assess and provide for patients comfort related to the hyperbaric environment and equalization of middle ear Assess for signs and symptoms related to adverse events, including but not limited to confinement anxiety, pneumothorax, oxygen toxicity and baurotrauma Assess patient's knowledge and expectations regarding hyperbaric medicine and provide education related to the hyperbaric environment, goals of treatment and prevention of adverse events Notes: Wound/Skin Impairment Nursing Diagnoses: Knowledge deficit related to ulceration/compromised skin integrity Goals: Patient/caregiver will verbalize understanding of skin care regimen Date Initiated: 05/27/2019 Target Resolution Date: 07/31/2019 Goal Status: Active Ulcer/skin breakdown will have a volume reduction of 30% by week 4 Date Initiated: 05/27/2019 Date Inactivated: 07/01/2019 Target Resolution Date: 06/27/2019 Goal Status: Unmet Unmet Reason: comorbities Ulcer/skin breakdown will have a volume reduction of 50% by week 8 Date Initiated: 07/01/2019 Target Resolution Date: 07/31/2019 Goal Status: Active Interventions: Assess patient/caregiver ability to obtain necessary supplies Assess patient/caregiver ability to perform ulcer/skin care regimen upon admission and as needed Assess ulceration(s) every visit Notes: Electronic Signature(s) Signed: 07/29/2019 5:57:23 PM By: Carlene Coria RN Entered By: Carlene Coria on 07/29/2019 14:28:18 -------------------------------------------------------------------------------- Pain Assessment Details Patient Name: Date of Service: [place], [name] Sara A. 07/29/2019 2:15 PM Medical Record Number: [id number] Patient Account Number: [id number] Date of Birth/Sex: Treating RN: [birth year] (86  y.o. [name] Primary Care Gershom Brobeck: [name] [name], [place] Other Clinician: Referring Kalon Erhardt: Treating Bethany Cumming/Extender: [name] in Treatment: 9 Active  Problems Location of Pain Severity and Description of Pain Patient Has Paino Yes Site Locations Pain Location: Pain Location: Pain in Ulcers With Dressing Change: Yes Duration of the Pain. Constant / Intermittento Intermittent Rate the pain. Current Pain Level: 4 Character of Pain Describe the Pain: Throbbing Pain Management and Medication Current Pain Management: Medication: Yes Cold Application: No Rest: No Massage: No Activity: No T.E.N.S.: No Heat Application: No Leg drop or elevation: No Is the Current Pain Management Adequate: Adequate How does your wound impact your activities of daily livingo Sleep: No Bathing: No Appetite: No Relationship With Others: No Bladder Continence: No Emotions: No Bowel Continence: No Work: No Toileting: No Drive: No Dressing: No Hobbies: No Electronic Signature(s) Signed: 08/04/2019 4:12:59 PM By: Levan Hurst RN, BSN Entered By: Levan Hurst on 07/29/2019 14:45:27 -------------------------------------------------------------------------------- Patient/Caregiver Education Details Patient Name: Date of Service: [name], [name] Sara A. 7/13/2021andnbsp2:15 PM Medical Record Number: [id number] Patient Account Number: [id number] Date of Birth/Gender: Treating RN: 1929/04/27 (86 y.o. [name] Primary Care Physician: [name] [name], [name] Other Clinician: Referring Physician: Treating Physician/Extender: [name] in Treatment: 9 Education Assessment Education Provided To: Patient Education Topics Provided Wound/Skin Impairment: Methods: Explain/Verbal Responses: State content correctly Electronic Signature(s) Signed: 07/29/2019 5:57:23 PM By: Carlene Coria RN Entered By: Carlene Coria on 07/29/2019 14:28:34 -------------------------------------------------------------------------------- Wound Assessment Details Patient Name: Date of Service: [place], [name] Sara A. 07/29/2019 2:15 PM Medical Record Number:  [id number] Patient Account Number: [id number] Date of Birth/Sex: Treating RN: 01/13/1930 (86 y.o. [name] Primary Care Andreanna Mikolajczak: [name] TIENT, NO Other Clinician: Referring Marionette Meskill: Treating Shayon Trompeter/Extender: [name] in Treatment: 9 Wound Status Wound Number: 1 Primary Soft Tissue Radionecrosis Etiology: Wound Location: Left, Lateral Lower Leg Wound Open Wounding Event: Bump Status: Date Acquired: 10/17/2018 Comorbid Cataracts, Deep Vein Thrombosis, Hypertension, Osteoarthritis, Weeks Of Treatment: 9 History: Neuropathy, Received Chemotherapy, Received Radiation, Clustered Wound: No Confinement Anxiety Photos Photo Uploaded By: Mikeal Hawthorne on 08/01/2019 13:52:50 Wound Measurements Length: (cm) 7.6 Width: (cm) 4 Depth: (cm) 0.5 Area: (cm) 23.876 Volume: (cm) 11.938 % Reduction in Area: -20.8% % Reduction in Volume: -51% Epithelialization: Small (1-33%) Tunneling: No Undermining: No Wound Description Classification: Full Thickness With Exposed Support Structures Wound Margin: Distinct, outline attached Exudate Amount: Medium Exudate Type: Serous Exudate Color: amber Foul Odor After Cleansing: No Slough/Fibrino Yes Wound Bed Granulation Amount: Small (1-33%) Exposed Structure Granulation Quality: Pink Fascia Exposed: No Necrotic Amount: Large (67-100%) Fat Layer (Subcutaneous Tissue) Exposed: Yes Necrotic Quality: Adherent Slough Tendon Exposed: Yes Muscle Exposed: Yes Necrosis of Muscle: No Joint Exposed: No Bone Exposed: No Treatment Notes Wound #1 (Left, Lateral Lower Leg) 1. Cleanse With Wound Cleanser 3. Primary Dressing Applied [name] 4. Secondary Dressing Roll Gauze Other secondary dressing (specify in notes) Notes moistened saline gauze. netting Electronic Signature(s) Signed: 08/04/2019 4:12:59 PM By: Levan Hurst RN, BSN Entered By: Levan Hurst on 07/29/2019  14:45:54 -------------------------------------------------------------------------------- [place] Details Patient Name: Date of Service: [name], [name] Sara A. 07/29/2019 2:15 PM Medical Record Number: [id number] Patient Account Number: [id number] Date of Birth/Sex: Treating RN: November 02, 1929 (86 y.o. [name] Primary Care Avalin Briley: [name] TIENT, NO Other Clinician: Referring Isaias Dowson: Treating Dreanna Kyllo/Extender: [name] in Treatment: 9 Vital Signs Time Taken: 14:45 Temperature (F): 98.4 Height (in): 65 Pulse (bpm): 70 Weight (lbs): 138 Respiratory Rate (breaths/min): 16 Body Mass Index (BMI):  23 Blood Pressure (mmHg): 108/65 Reference Range: 80 - 120 mg / dl Electronic Signature(s) Signed: 08/04/2019 4:12:59 PM By: Levan Hurst RN, BSN Entered By: Levan Hurst on 07/29/2019 14:45:12

## 2019-08-04 NOTE — Progress Notes (Signed)
[name], [name] ([id number]) Visit Report for 08/01/2019 HBO Details Patient Name: Date of Service: [place], [name] 08/01/2019 3:00 PM Medical Record Number: [id number] Patient Account Number: [id number] Date of Birth/Sex: Treating RN: 08-06-1929 (86 y.o. 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 HBO Treatment Details Treatment Number: 22 Patient Type: Outpatient Chamber Type: Monoplace Chamber Serial #: [id number] Treatment Protocol: 2.0 ATA with 90 minutes oxygen, and no air breaks Treatment Details Compression Rate Down: 2.0 psi / minute De-Compression Rate Up: 3.0 psi / minute Air breaks and breathing Decompress Decompress Compress Tx Pressure Begins Reached periods Begins Ends (leave unused spaces blank) Chamber Pressure (ATA 1 2 ------2 1 ) Clock Time (24 hr) 14:46 14:56 - - - - - - 16:36 16:42 Treatment Length: 116 (minutes) Treatment Segments: 4 Vital Signs Capillary Blood Glucose Reference Range: 80 - 120 mg / dl HBO Diabetic Blood Glucose Intervention Range: <131 mg/dl or >249 mg/dl Time Vitals Blood Respiratory Capillary Blood Glucose Pulse Action Type: Pulse: Temperature: Taken: Pressure: Rate: Glucose (mg/dl): Meter #: Oximetry (%) Taken: Pre 14:37 125/68 68 14 98.2 Post 16:45 130/62 52 14 97.8 Treatment Response Treatment Toleration: Well Treatment Completion Status: Treatment Completed without Adverse Event Blayke Cordrey Notes No concerns with treatment given Physician HBO Attestation: I certify that I supervised this HBO treatment in accordance with Medicare guidelines. A trained  emergency response team is readily available per Yes hospital policies and procedures. Continue HBOT as ordered. Yes Electronic Signature(s) Signed: 08/04/2019 8:03:52 AM By: Linton Ham MD Previous Signature: 08/01/2019 5:36:16 PM Version By: Mikeal Hawthorne EMT/HBOT/SD Entered By: Linton Ham on 08/04/2019 08:01:28 -------------------------------------------------------------------------------- HBO Safety Checklist Details Patient Name: Date of Service: Sara [facility], Sara Sara A. 08/01/2019 3:00 PM Medical Record Number: [id number] Patient Account Number: [id number] Date of Birth/Sex: Treating RN: [birth year] (86 y.o. [name] Primary Care Serinity Ware: Sara [name], [name] Other Clinician: [name] Referring Bernadine Melecio: Treating Kimesha Claxton/Extender: [name] in Treatment: 9 HBO Safety Checklist Items Safety Checklist Consent Form Signed Patient voided / foley secured and emptied When did you last eato N/A Last dose of injectable or oral agent n/a Ostomy pouch emptied and vented if applicable NA All implantable devices assessed, documented and approved NA Intravenous access site secured and place NA Valuables secured Linens and cotton and cotton/polyester blend (less than 51% polyester) Personal oil-based products / skin lotions / body lotions removed Wigs or hairpieces removed Smoking or tobacco materials removed NA Books / newspapers / magazines / loose paper removed Cologne, aftershave, perfume and deodorant removed Jewelry removed (may wrap wedding band) Make-up removed Hair care products removed Battery operated devices (external) removed NA Heating patches and chemical warmers removed NA Titanium eyewear removed NA Nail polish cured greater than 10 hours NA Casting material cured greater than 10 hours NA Hearing aids removed NA Loose dentures or partials removed NA Prosthetics have been removed NA Patient demonstrates correct use of air  break device (if applicable) Patient concerns have been addressed Patient grounding bracelet on and cord attached to chamber Specifics for Inpatients (complete in addition to above) Medication sheet sent with patient Intravenous medications needed or due during therapy sent with patient Drainage tubes (e.g. nasogastric tube or chest tube secured  and vented) Endotracheal or Tracheotomy tube secured Cuff deflated of air and inflated with saline Airway suctioned Electronic Signature(s) Signed: 08/01/2019 5:36:16 PM By: Mikeal Hawthorne EMT/HBOT/SD Entered By: Mikeal Hawthorne on 08/01/2019 16:54:28

## 2019-08-04 NOTE — Progress Notes (Signed)
[name], [name] ([id number]) Visit Report for 08/01/2019 SuperBill Details Patient Name: Date of Service: [place], [name] 08/01/2019 Medical Record Number: [id number] Patient Account Number: [id number] Date of Birth/Sex: Treating RN: 1929-07-29 (86 y.o. 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

## 2019-08-04 NOTE — Progress Notes (Addendum)
[name], [name] ([id number]) Visit Report for 08/04/2019 HBO Details Patient Name: Date of Service: [place], [name] 08/04/2019 3:00 PM Medical Record Number: [id number] Patient Account Number: [id number] Date of Birth/Sex: Treating RN: [birth year] (86 y.o. 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 HBO Treatment Details Treatment Number: 23 Patient Type: Outpatient Chamber Type: Monoplace Chamber Serial #: [id number] Treatment Protocol: 2.0 ATA with 90 minutes oxygen, and no air breaks Treatment Details Compression Rate Down: 2.0 psi / minute De-Compression Rate Up: 2.0 psi / minute Air breaks and breathing Decompress Decompress Compress Tx Pressure Begins Reached periods Begins Ends (leave unused spaces blank) Chamber Pressure (ATA 1 2 ------2 1 ) Clock Time (24 hr) 15:02 15:10 - - - - - - 16:40 16:48 Treatment Length: 106 (minutes) Treatment Segments: 4 Vital Signs Capillary Blood Glucose Reference Range: 80 - 120 mg / dl HBO Diabetic Blood Glucose Intervention Range: <131 mg/dl or >249 mg/dl Time Vitals Blood Respiratory Capillary Blood Glucose Pulse Action Type: Pulse: Temperature: Taken: Pressure: Rate: Glucose (mg/dl): Meter #: Oximetry (%) Taken: Pre 14:54 122/62 66 16 97.9 Post 16:50 142/62 51 14 98.2 Treatment Response Treatment Toleration: Well Treatment Completion Status: Treatment Completed without Adverse Event Electronic Signature(s) Signed: 08/04/2019 5:17:54 PM By: Mikeal Hawthorne EMT/HBOT/SD Signed: 08/05/2019 6:00:01 PM By: Linton Ham MD Entered By: Mikeal Hawthorne on 08/04/2019  16:56:11 -------------------------------------------------------------------------------- HBO Safety Checklist Details Patient Name: Date of Service: [place], [name] 08/04/2019 3:00 PM Medical Record Number: [id number] Patient Account Number: [id number] Date of Birth/Sex: Treating RN: March 25, 1929 (86 y.o. [name] Primary Care Rosetta Rupnow: PA [name], [place] Other Clinician: Mikeal Hawthorne Referring Janaia Kozel: Treating Charmon Thorson/Extender: [name] in Treatment: 9 HBO Safety Checklist Items Safety Checklist Consent Form Signed Patient voided / foley secured and emptied When did you last eato N/A Last dose of injectable or oral agent n/a Ostomy pouch emptied and vented if applicable NA All implantable devices assessed, documented and approved NA Intravenous access site secured and place NA Valuables secured Linens and cotton and cotton/polyester blend (less than 51% polyester) Personal oil-based products / skin lotions / body lotions removed Wigs or hairpieces removed Smoking or tobacco materials removed NA Books / newspapers / magazines / loose paper removed Cologne, aftershave, perfume and deodorant removed Jewelry removed (may wrap wedding band) Make-up removed Hair care products removed Battery operated devices (external) removed NA Heating patches and chemical warmers removed NA Titanium eyewear removed NA Nail polish cured greater than 10 hours NA Casting material cured greater than 10 hours NA Hearing aids removed NA Loose dentures or partials removed NA Prosthetics have been removed NA Patient demonstrates correct use of air break device (if applicable) Patient concerns have been addressed Patient grounding bracelet on and cord attached to chamber Specifics for Inpatients (complete in addition to above) Medication sheet sent with patient Intravenous medications needed or due during therapy sent with patient Drainage tubes (e.g.  nasogastric tube or chest tube secured and vented) Endotracheal or Tracheotomy tube secured Cuff deflated of air and inflated with saline Airway suctioned Electronic Signature(s) Signed: 08/04/2019 4:19:42 PM By: Mikeal Hawthorne EMT/HBOT/SD Previous Signature: 08/04/2019 4:19:15 PM Version By: Mikeal Hawthorne EMT/HBOT/SD Entered By: Mikeal Hawthorne on 08/04/2019 16:19:41

## 2019-08-05 ENCOUNTER — Other Ambulatory Visit: Payer: Self-pay

## 2019-08-05 ENCOUNTER — Encounter (HOSPITAL_BASED_OUTPATIENT_CLINIC_OR_DEPARTMENT_OTHER): Payer: Medicare Other | Admitting: Internal Medicine

## 2019-08-05 DIAGNOSIS — L598 Other specified disorders of the skin and subcutaneous tissue related to radiation: Secondary | ICD-10-CM | POA: Diagnosis not present

## 2019-08-05 NOTE — Progress Notes (Signed)
EULOGIA, DISMORE ([id number]) Visit Report for 08/05/2019 Arrival Information Details Patient Name: Date of Service: [place], [name] 08/05/2019 3:00 PM Medical Record Number: [id number] Patient Account Number: [id number] Date of Birth/Sex: Treating RN: 1929-06-28 (86 y.o. [name] Primary Care Destynie Toomey: PA [name], [name] Other Clinician: Mikeal Hawthorne Referring Ashonte Angelucci: Treating Trayden Brandy/Extender: [name] in Treatment: 10 Visit Information History Since Last Visit Added or deleted any medications: No Patient Arrived: [name] Any new allergies or adverse reactions: No Arrival Time: 14:45 Had a fall or experienced change in No Accompanied By: self activities of daily living that may affect Transfer Assistance: None risk of falls: Patient Identification Verified: Yes Signs or symptoms of abuse/neglect since last visito No Secondary Verification Process Completed: Yes Hospitalized since last visit: No Patient Requires Transmission-Based Precautions: No Implantable device outside of the clinic excluding No Patient Has Alerts: No cellular tissue based products placed in the center since last visit: Pain Present Now: No Electronic Signature(s) Signed: 08/05/2019 5:14:12 PM By: Mikeal Hawthorne EMT/HBOT/SD Entered By: Mikeal Hawthorne on 08/05/2019 15:05:40 -------------------------------------------------------------------------------- Encounter Discharge Information Details Patient Name: Date of Service: [name], [name] 08/05/2019 3:00 PM Medical Record Number: [id number] Patient Account Number: [id number] Date of Birth/Sex: Treating RN: 10/13/1929 (86 y.o. [name] Primary Care Ashira Kirsten: PA [name], [name] Other Clinician: Mikeal Hawthorne Referring Omarian Jaquith: Treating Vonya Ohalloran/Extender: [name] in Treatment: 10 Encounter Discharge Information Items Discharge Condition: Stable Ambulatory Status: Walker Discharge Destination:  Home Transportation: Private Auto Accompanied By: self Schedule Follow-up Appointment: Yes Clinical Summary of Care: Patient Declined Electronic Signature(s) Signed: 08/05/2019 5:14:12 PM By: Mikeal Hawthorne EMT/HBOT/SD Entered By: Mikeal Hawthorne on 08/05/2019 16:53:05 -------------------------------------------------------------------------------- Patient/Caregiver Education Details Patient Name: Date of Service: [name], [name] 7/20/2021andnbsp3:00 PM Medical Record Number: [id number] Patient Account Number: [id number] Date of Birth/Gender: Treating RN: [birth year] (86 y.o. [name] Primary Care Physician: PA [name], [name] Other Clinician: Mikeal Hawthorne Referring Physician: Treating Physician/Extender: [name] in Treatment: 10 Education Assessment Education Provided To: Patient Education Topics Provided Hyperbaric Oxygenation: Methods: Explain/Verbal Responses: State content correctly Electronic Signature(s) Signed: 08/05/2019 5:14:12 PM By: Mikeal Hawthorne EMT/HBOT/SD Entered By: Mikeal Hawthorne on 08/05/2019 16:52:49 -------------------------------------------------------------------------------- Vitals Details Patient Name: Date of Service: [name], [name] 08/05/2019 3:00 PM Medical Record Number: [id number] Patient Account Number: [id number] Date of Birth/Sex: Treating RN: Dec 24, 1929 (86 y.o. [name] Primary Care Jessiah Steinhart: PA [name], [name] Other Clinician: Mikeal Hawthorne Referring Biran Mayberry: Treating Panagiota Perfetti/Extender: [name] in Treatment: 10 Vital Signs Time Taken: 14:50 Temperature (F): 98.2 Height (in): 65 Pulse (bpm): 66 Weight (lbs): 138 Respiratory Rate (breaths/min): 15 Body Mass Index (BMI): 23 Blood Pressure (mmHg): 110/63 Reference Range: 80 - 120 mg / dl Electronic Signature(s) Signed: 08/05/2019 5:14:12 PM By: Mikeal Hawthorne EMT/HBOT/SD Entered By: Mikeal Hawthorne on 08/05/2019 15:05:55

## 2019-08-05 NOTE — Progress Notes (Signed)
COPPER, KIRTLEY ([id number]) Visit Report for 08/04/2019 SuperBill Details Patient Name: Date of Service: [place], [name] 08/04/2019 Medical Record Number: [id number] Patient Account Number: [id number] Date of Birth/Sex: Treating RN: 03-08-1929 (86 y.o. 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

## 2019-08-05 NOTE — Progress Notes (Addendum)
MERSADIE, KAVANAUGH ([id number]) Visit Report for 08/05/2019 HBO Details Patient Name: Date of Service: [name], [name] Sara A. 08/05/2019 3:00 PM Medical Record Number: [id number] Patient Account Number: [id number] Date of Birth/Sex: Treating RN: [birth year] (86 y.o. 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 HBO Treatment Details Treatment Number: 24 Patient Type: Outpatient Chamber Type: Monoplace Chamber Serial #: [id number] Treatment Protocol: 2.0 ATA with 90 minutes oxygen, and no air breaks Treatment Details Compression Rate Down: 2.0 psi / minute De-Compression Rate Up: 2.0 psi / minute Air breaks and breathing Decompress Decompress Compress Tx Pressure Begins Reached periods Begins Ends (leave unused spaces blank) Chamber Pressure (ATA 1 2 ------2 1 ) Clock Time (24 hr) 15:00 15:08 - - - - - - 16:38 16:46 Treatment Length: 106 (minutes) Treatment Segments: 4 Vital Signs Capillary Blood Glucose Reference Range: 80 - 120 mg / dl HBO Diabetic Blood Glucose Intervention Range: <131 mg/dl or >249 mg/dl Time Vitals Blood Respiratory Capillary Blood Glucose Pulse Action Type: Pulse: Temperature: Taken: Pressure: Rate: Glucose (mg/dl): Meter #: Oximetry (%) Taken: Pre 14:50 110/63 66 15 98.2 Post 16:50 130/70 55 15 98.5 Treatment Response Treatment Toleration: Well Treatment Completion Status: Treatment Completed without Adverse Event Minnetta Sandora Notes No concerns with treatment given Physician HBO Attestation: I certify that I supervised this HBO treatment in accordance with Medicare guidelines. A trained emergency  response team is readily available per Yes hospital policies and procedures. Continue HBOT as ordered. Yes Electronic Signature(s) Signed: 08/05/2019 6:00:01 PM By: Linton Ham MD Previous Signature: 08/05/2019 5:14:12 PM Version By: Mikeal Hawthorne EMT/HBOT/SD Entered By: Linton Ham on 08/05/2019 17:58:39 -------------------------------------------------------------------------------- HBO Safety Checklist Details Patient Name: Date of Service: [facility], [name] 08/05/2019 3:00 PM Medical Record Number: [id number] Patient Account Number: [id number] Date of Birth/Sex: Treating RN: February 14, 1929 (86 y.o. [name] Primary Care [name]: PA [name], [name] Other Clinician: Mikeal Hawthorne Referring Santasia Rew: Treating [name]/Extender: [name] in Treatment: 10 HBO Safety Checklist Items Safety Checklist Consent Form Signed Patient voided / foley secured and emptied When did you last eato n/a Last dose of injectable or oral agent n/a Ostomy pouch emptied and vented if applicable NA All implantable devices assessed, documented and approved NA Intravenous access site secured and place NA Valuables secured Linens and cotton and cotton/polyester blend (less than 51% polyester) Personal oil-based products / skin lotions / body lotions removed Wigs or hairpieces removed NA Smoking or tobacco materials removed NA Books / newspapers / magazines / loose paper removed Cologne, aftershave, perfume and deodorant removed Jewelry removed (may wrap wedding band) Make-up removed Hair care products removed Battery operated devices (external) removed NA Heating patches and chemical warmers removed NA Titanium eyewear removed NA Nail polish cured greater than 10 hours Casting material cured greater than 10 hours NA Hearing aids removed NA Loose dentures or partials removed NA Prosthetics have been removed NA Patient demonstrates correct use of air break device  (if applicable) Patient concerns have been addressed Patient grounding bracelet on and cord attached to chamber Specifics for Inpatients (complete in addition to above) Medication sheet sent with patient Intravenous medications needed or due during therapy sent with patient Drainage tubes (e.g. nasogastric tube or chest tube secured  and vented) Endotracheal or Tracheotomy tube secured Cuff deflated of air and inflated with saline Airway suctioned Electronic Signature(s) Signed: 08/05/2019 3:06:39 PM By: Mikeal Hawthorne EMT/HBOT/SD Entered By: Mikeal Hawthorne on 08/05/2019 15:06:38

## 2019-08-05 NOTE — Progress Notes (Signed)
KAJUANA, SHAREEF ([id number]) Visit Report for 08/05/2019 SuperBill Details Patient Name: Date of Service: [place], [name] 08/05/2019 Medical Record Number: [id number] Patient Account Number: [id number] Date of Birth/Sex: Treating RN: [birth year] (86 y.o. 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

## 2019-08-06 ENCOUNTER — Other Ambulatory Visit: Payer: Self-pay

## 2019-08-06 ENCOUNTER — Encounter (HOSPITAL_BASED_OUTPATIENT_CLINIC_OR_DEPARTMENT_OTHER): Payer: Medicare Other | Admitting: Physician Assistant

## 2019-08-06 DIAGNOSIS — L598 Other specified disorders of the skin and subcutaneous tissue related to radiation: Secondary | ICD-10-CM | POA: Diagnosis not present

## 2019-08-06 NOTE — Progress Notes (Signed)
JARED, CAHN ([id number]) Visit Report for 08/06/2019 SuperBill Details Patient Name: Date of Service: [place], [name] 08/06/2019 Medical Record Number: [id number] Patient Account Number: [id number] Date of Birth/Sex: Treating RN: [birth year] (86 y.o. 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, unspecified C83.30 Diffuse large B-cell lymphoma, unspecified site Facility Procedures CPT4 Code Description Modifier Quantity [id number] G0277-(Facility Use Only) HBOT full body chamber, 99min , 4 Physician Procedures Quantity CPT4 Code Description Modifier [id number] 60600 - WC PHYS HYPERBARIC OXYGEN THERAPY 1 ICD-10 Diagnosis Description 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

## 2019-08-06 NOTE — Progress Notes (Signed)
[name], [name] ([id number]) Visit Report for 08/06/2019 Arrival Information Details Patient Name: Date of Service: [place], [name] 08/06/2019 3:00 PM Medical Record Number: [id number] Patient Account Number: [id number] Date of Birth/Sex: Treating RN: Jul 17, 1929 (86 y.o. [name] Primary Care Carol Theys: PA [name], [place] Other Clinician: Mikeal Hawthorne Referring Amiyah Shryock: Treating Raigen Jagielski/Extender: [name] in Treatment: 10 Visit Information History Since Last Visit Added or deleted any medications: No Patient Arrived: [name] Any new allergies or adverse reactions: No Arrival Time: 14:50 Had a fall or experienced change in No Accompanied By: self activities of daily living that may affect Transfer Assistance: None risk of falls: Patient Identification Verified: Yes Signs or symptoms of abuse/neglect since last visito No Secondary Verification Process Completed: Yes Hospitalized since last visit: No Patient Requires Transmission-Based Precautions: No Implantable device outside of the clinic excluding No Patient Has Alerts: No cellular tissue based products placed in the center since last visit: Pain Present Now: No Electronic Signature(s) Signed: 08/06/2019 4:55:49 PM By: Mikeal Hawthorne EMT/HBOT/SD Entered By: Mikeal Hawthorne on 08/06/2019 15:43:36 -------------------------------------------------------------------------------- Encounter Discharge Information Details Patient Name: Date of Service: Sara RER, [name] 08/06/2019 3:00 PM Medical Record Number: [id number] Patient Account Number: [id number] Date of Birth/Sex: Treating RN: [birth year] (86 y.o. [name] Primary Care Kenaz Olafson: PA [name], [name] Other Clinician: Mikeal Hawthorne Referring Carrina Schoenberger: Treating Cliffard Hair/Extender: [name] in Treatment: 10 Encounter Discharge Information Items Discharge Condition: Stable Ambulatory Status: Walker Discharge Destination:  Home Transportation: Private Auto Accompanied By: self Schedule Follow-up Appointment: Yes Clinical Summary of Care: Patient Declined Electronic Signature(s) Signed: 08/06/2019 4:55:49 PM By: Mikeal Hawthorne EMT/HBOT/SD Entered By: Mikeal Hawthorne on 08/06/2019 16:55:28 -------------------------------------------------------------------------------- Patient/Caregiver Education Details Patient Name: Date of Service: Sara Benne, [name] 7/21/2021andnbsp3:00 PM Medical Record Number: [id number] Patient Account Number: [id number] Date of Birth/Gender: Treating RN: 24-May-1929 (86 y.o. [name] Primary Care Physician: PA [name], [name] Other Clinician: Mikeal Hawthorne Referring Physician: Treating Physician/Extender: [name] in Treatment: 10 Education Assessment Education Provided To: Patient Education Topics Provided Hyperbaric Oxygenation: Methods: Explain/Verbal Responses: State content correctly Electronic Signature(s) Signed: 08/06/2019 4:55:49 PM By: Mikeal Hawthorne EMT/HBOT/SD Entered By: Mikeal Hawthorne on 08/06/2019 16:55:14 -------------------------------------------------------------------------------- Vitals Details Patient Name: Date of Service: Sara RER, [name] 08/06/2019 3:00 PM Medical Record Number: [id number] Patient Account Number: [id number] Date of Birth/Sex: Treating RN: October 05, 1929 (86 y.o. [name] Primary Care Aloria Looper: PA [name], [name] Other Clinician: Mikeal Hawthorne Referring Karlyn Glasco: Treating Tannah Dreyfuss/Extender: [name] in Treatment: 10 Vital Signs Time Taken: 14:55 Temperature (F): 98.1 Height (in): 65 Pulse (bpm): 72 Weight (lbs): 138 Respiratory Rate (breaths/min): 16 Body Mass Index (BMI): 23 Blood Pressure (mmHg): 119/71 Reference Range: 80 - 120 mg / dl Electronic Signature(s) Signed: 08/06/2019 4:55:49 PM By: Mikeal Hawthorne EMT/HBOT/SD Entered By: Mikeal Hawthorne on 08/06/2019 15:43:52

## 2019-08-06 NOTE — Progress Notes (Addendum)
[name], [name] ([id number]) Visit Report for 08/06/2019 HBO Details Patient Name: Date of Service: [name], [name] 08/06/2019 3:00 PM Medical Record Number: [id number] Patient Account Number: [id number] Date of Birth/Sex: Treating RN: Oct 24, 1929 (86 y.o. 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 HBO Treatment Details Treatment Number: 25 Patient Type: Outpatient Chamber Type: Monoplace Chamber Serial #: [id number] Treatment Protocol: 2.0 ATA with 90 minutes oxygen, and no air breaks Treatment Details Compression Rate Down: 2.0 psi / minute De-Compression Rate Up: 2.0 psi / minute Air breaks and breathing Decompress Decompress Compress Tx Pressure Begins Reached periods Begins Ends (leave unused spaces blank) Chamber Pressure (ATA 1 2 ------2 1 ) Clock Time (24 hr) 15:00 15:08 - - - - - - 16:38 16:46 Treatment Length: 106 (minutes) Treatment Segments: 4 Vital Signs Capillary Blood Glucose Reference Range: 80 - 120 mg / dl HBO Diabetic Blood Glucose Intervention Range: <131 mg/dl or >249 mg/dl Time Vitals Blood Respiratory Capillary Blood Glucose Pulse Action Type: Pulse: Temperature: Taken: Pressure: Rate: Glucose (mg/dl): Meter #: Oximetry (%) Taken: Pre 14:55 119/71 72 16 98.1 Post 16:48 135/70 53 18 98.4 Treatment Response Treatment Toleration: Well Treatment Completion Status: Treatment Completed without Adverse Event Electronic Signature(s) Signed: 08/06/2019 4:55:49 PM By: Mikeal Hawthorne EMT/HBOT/SD Signed: 08/06/2019 6:54:26 PM By: Worthy Keeler PA-C Entered By: Mikeal Hawthorne on 08/06/2019  16:54:51 -------------------------------------------------------------------------------- HBO Safety Checklist Details Patient Name: Date of Service: Sara Benne, [name] 08/06/2019 3:00 PM Medical Record Number: [id number] Patient Account Number: [id number] Date of Birth/Sex: Treating RN: 1929/06/20 (86 y.o. [name] Primary Care Alara Daniel: PA [name], [place] Other Clinician: Mikeal Hawthorne Referring Zniyah Midkiff: Treating Margo Lama/Extender: [name] in Treatment: 10 HBO Safety Checklist Items Safety Checklist Consent Form Signed Patient voided / foley secured and emptied When did you last eato n/a Last dose of injectable or oral agent n/a Ostomy pouch emptied and vented if applicable NA All implantable devices assessed, documented and approved NA Intravenous access site secured and place NA Valuables secured Linens and cotton and cotton/polyester blend (less than 51% polyester) Personal oil-based products / skin lotions / body lotions removed Wigs or hairpieces removed NA Smoking or tobacco materials removed NA Books / newspapers / magazines / loose paper removed Cologne, aftershave, perfume and deodorant removed Jewelry removed (may wrap wedding band) Make-up removed Hair care products removed Battery operated devices (external) removed NA Heating patches and chemical warmers removed NA Titanium eyewear removed NA Nail polish cured greater than 10 hours NA Casting material cured greater than 10 hours NA Hearing aids removed NA Loose dentures or partials removed NA Prosthetics have been removed NA Patient demonstrates correct use of air break device (if applicable) Patient concerns have been addressed Patient grounding bracelet on and cord attached to chamber Specifics for Inpatients (complete in addition to above) Medication sheet sent with patient Intravenous medications needed or due during therapy sent with patient Drainage tubes (e.g.  nasogastric tube or chest tube secured and vented) Endotracheal or Tracheotomy tube secured Cuff deflated of air and inflated with saline Airway suctioned Electronic Signature(s) Signed: 08/06/2019 3:44:33 PM By: Mikeal Hawthorne EMT/HBOT/SD Entered By: Mikeal Hawthorne on 08/06/2019 15:44:32

## 2019-08-07 ENCOUNTER — Encounter (HOSPITAL_BASED_OUTPATIENT_CLINIC_OR_DEPARTMENT_OTHER): Payer: Medicare Other | Admitting: Internal Medicine

## 2019-08-07 DIAGNOSIS — L598 Other specified disorders of the skin and subcutaneous tissue related to radiation: Secondary | ICD-10-CM | POA: Diagnosis not present

## 2019-08-07 NOTE — Progress Notes (Signed)
[name], [name] ([id number]) Visit Report for 08/07/2019 Arrival Information Details Patient Name: Date of Service: [name], [name] 08/07/2019 3:00 PM Medical Record Number: [id number] Patient Account Number: [id number] Date of Birth/Sex: Treating RN: [birth year] (86 y.o. [name] Primary Care [name]: PA [name], [place] Other Clinician: Mikeal [name] Referring [name]: Treating [name]/Extender: [name] in Treatment: 10 Visit Information History Since Last Visit Added or deleted any medications: No Patient Arrived: [name] Any new allergies or adverse reactions: No Arrival Time: 14:40 Had a fall or experienced change in No Accompanied By: self activities of daily living that may affect Transfer Assistance: None risk of falls: Patient Identification Verified: Yes Signs or symptoms of abuse/neglect since last visito No Secondary Verification Process Completed: Yes Hospitalized since last visit: No Patient Requires Transmission-Based Precautions: No Implantable device outside of the clinic excluding No Patient Has Alerts: No cellular tissue based products placed in the center since last visit: Pain Present Now: No Electronic Signature(s) Signed: 08/07/2019 5:13:04 PM By: [name] EMT/HBOT/SD Entered By: [name] on 08/07/2019 14:54:01 -------------------------------------------------------------------------------- Encounter Discharge Information Details Patient Name: Date of Service: [name], [name] 08/07/2019 3:00 PM Medical Record Number: [id number] Patient Account Number: [id number] Date of Birth/Sex: Treating RN: 19-Feb-1929 (86 y.o. [name] Primary Care [name]: PA [name], [place] Other Clinician: Mikeal [name] Referring [name]: Treating [name]/Extender: [name] in Treatment: 10 Encounter Discharge Information Items Discharge Condition: Stable Ambulatory Status: [name] Discharge Destination:  Home Transportation: Private Auto Accompanied By: self Schedule Follow-up Appointment: Yes Clinical Summary of Care: Patient Declined Electronic Signature(s) Signed: 08/07/2019 5:13:04 PM By: [name] EMT/HBOT/SD Entered By: [name] on 08/07/2019 16:53:34 -------------------------------------------------------------------------------- Patient/Caregiver Education Details Patient Name: Date of Service: Sara Benne, PA [name] 7/22/2021andnbsp3:00 PM Medical Record Number: [id number] Patient Account Number: [id number] Date of Birth/Gender: Treating RN: 28-May-1929 (86 y.o. [name] Primary Care Physician: PA [name], [name] Other Clinician: Mikeal [name] Referring Physician: Treating Physician/Extender: [name] in Treatment: 10 Education Assessment Education Provided To: Patient Education Topics Provided Hyperbaric Oxygenation: Methods: Explain/Verbal Responses: State content correctly Electronic Signature(s) Signed: 08/07/2019 5:13:04 PM By: [name] EMT/HBOT/SD Entered By: [name] on 08/07/2019 16:53:23 -------------------------------------------------------------------------------- Vitals Details Patient Name: Date of Service: Sara Benne, [name] 08/07/2019 3:00 PM Medical Record Number: [id number] Patient Account Number: [id number] Date of Birth/Sex: Treating RN: 28-Aug-1929 (86 y.o. [name] Primary Care Omarr Hann: PA [name], [place] Other Clinician: Mikeal [name] Referring Madell Heino: Treating Omarie Parcell/Extender: [name] in Treatment: 10 Vital Signs Time Taken: 14:45 Temperature (F): 98.1 Height (in): 65 Pulse (bpm): 70 Weight (lbs): 138 Respiratory Rate (breaths/min): 14 Body Mass Index (BMI): 23 Blood Pressure (mmHg): 113/66 Reference Range: 80 - 120 mg / dl Electronic Signature(s) Signed: 08/07/2019 5:13:04 PM By: [name] EMT/HBOT/SD Entered By: [name] on 08/07/2019 14:54:18

## 2019-08-07 NOTE — Progress Notes (Addendum)
Sara, [name] ([id number]) Visit Report for 08/07/2019 HBO Details Patient Name: Date of Service: [name], [name] 08/07/2019 3:00 PM Medical Record Number: [id number] Patient Account Number: [id number] Date of Birth/Sex: Treating RN: 08/19/1929 (86 y.o. 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 HBO Treatment Details Treatment Number: 26 Patient Type: Outpatient Chamber Type: Monoplace Chamber Serial #: [id number] Treatment Protocol: 2.0 ATA with 90 minutes oxygen, and no air breaks Treatment Details Compression Rate Down: 2.0 psi / minute De-Compression Rate Up: 2.0 psi / minute Air breaks and breathing Decompress Decompress Compress Tx Pressure Begins Reached periods Begins Ends (leave unused spaces blank) Chamber Pressure (ATA 1 2 ------2 1 ) Clock Time (24 hr) 14:48 14:56 - - - - - - 16:26 16:34 Treatment Length: 106 (minutes) Treatment Segments: 4 Vital Signs Capillary Blood Glucose Reference Range: 80 - 120 mg / dl HBO Diabetic Blood Glucose Intervention Range: <131 mg/dl or >249 mg/dl Time Vitals Blood Respiratory Capillary Blood Glucose Pulse Action Type: Pulse: Temperature: Taken: Pressure: Rate: Glucose (mg/dl): Meter #: Oximetry (%) Taken: Pre 14:45 113/66 70 14 98.1 Post 16:37 133/85 63 16 98 Treatment Response Treatment Toleration: Well Treatment Completion Status: Treatment Completed without Adverse Event [place] Notes No concerns with treatment given Physician HBO Attestation: I certify that I supervised this HBO treatment in accordance with Medicare guidelines. A trained emergency  response team is readily available per Yes hospital policies and procedures. Continue HBOT as ordered. Yes Electronic Signature(s) Signed: 08/07/2019 5:29:29 PM By: Sara Ham MD Previous Signature: 08/07/2019 5:13:04 PM Version By: [name] EMT/HBOT/SD Entered By: [name] on 08/07/2019 17:28:07 -------------------------------------------------------------------------------- HBO Safety Checklist Details Patient Name: Date of Service: [name], [name] 08/07/2019 3:00 PM Medical Record Number: [id number] Patient Account Number: [id number] Date of Birth/Sex: Treating RN: 02-09-1929 (86 y.o. [name] Primary Care [name]: PA [name], [place] Other Clinician: Mikeal [name] Referring [name]: Treating [name]/Extender: [name] in Treatment: 10 HBO Safety Checklist Items Safety Checklist Consent Form Signed Patient voided / foley secured and emptied When did you last eato n/a Last dose of injectable or oral agent n/a Ostomy pouch emptied and vented if applicable NA All implantable devices assessed, documented and approved NA Intravenous access site secured and place NA Valuables secured Linens and cotton and cotton/polyester blend (less than 51% polyester) Personal oil-based products / skin lotions / body lotions removed Wigs or hairpieces removed NA Smoking or tobacco materials removed NA Books / newspapers / magazines / loose paper removed Cologne, aftershave, perfume and deodorant removed Jewelry removed (may wrap wedding band) Make-up removed Hair care products removed Battery operated devices (external) removed NA Heating patches and chemical warmers removed NA Titanium eyewear removed NA Nail polish cured greater than 10 hours NA Casting material cured greater than 10 hours NA Hearing aids removed NA Loose dentures or partials removed NA Prosthetics have been removed NA Patient demonstrates correct use of air break  device (if applicable) Patient concerns have been addressed Patient grounding bracelet on and cord attached to chamber Specifics for Inpatients (complete in addition to above) Medication sheet sent with patient Intravenous medications needed or due during therapy sent with patient Drainage tubes (e.g. nasogastric tube or chest tube  secured and vented) Endotracheal or Tracheotomy tube secured Cuff deflated of air and inflated with saline Airway suctioned Electronic Signature(s) Signed: 08/07/2019 2:54:55 PM By: [name] EMT/HBOT/SD Entered By: [name] on 08/07/2019 14:54:53

## 2019-08-07 NOTE — Progress Notes (Signed)
CATALENA, STANHOPE ([id number]) Visit Report for 08/07/2019 SuperBill Details Patient Name: Date of Service: [place], [name] 08/07/2019 Medical Record Number: [id number] Patient Account Number: [id number] Date of Birth/Sex: Treating RN: 11-Oct-1929 (86 y.o. 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

## 2019-08-08 ENCOUNTER — Encounter (HOSPITAL_BASED_OUTPATIENT_CLINIC_OR_DEPARTMENT_OTHER): Payer: Medicare Other | Admitting: Internal Medicine

## 2019-08-11 ENCOUNTER — Encounter (HOSPITAL_BASED_OUTPATIENT_CLINIC_OR_DEPARTMENT_OTHER): Payer: Medicare Other | Admitting: Internal Medicine

## 2019-08-11 DIAGNOSIS — L598 Other specified disorders of the skin and subcutaneous tissue related to radiation: Secondary | ICD-10-CM | POA: Diagnosis not present

## 2019-08-11 NOTE — Progress Notes (Addendum)
[name], [name] ([id number]) Visit Report for 08/11/2019 HBO Details Patient Name: Date of Service: [name], [name] 08/11/2019 3:00 PM Medical Record Number: [id number] Patient Account Number: [id number] Date of Birth/Sex: Treating RN: 1929-03-02 (86 y.o. 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 HBO Treatment Details Treatment Number: 27 Patient Type: Outpatient Chamber Type: Monoplace Chamber Serial #: [id number] Treatment Protocol: 2.0 ATA with 90 minutes oxygen, and no air breaks Treatment Details Compression Rate Down: 2.0 psi / minute De-Compression Rate Up: 2.0 psi / minute Air breaks and breathing Decompress Decompress Compress Tx Pressure Begins Reached periods Begins Ends (leave unused spaces blank) Chamber Pressure (ATA 1 2 ------2 1 ) Clock Time (24 hr) 15:03 15:11 - - - - - - 16:41 16:49 Treatment Length: 106 (minutes) Treatment Segments: 4 Vital Signs Capillary Blood Glucose Reference Range: 80 - 120 mg / dl HBO Diabetic Blood Glucose Intervention Range: <131 mg/dl or >249 mg/dl Time Vitals Blood Respiratory Capillary Blood Glucose Pulse Action Type: Pulse: Temperature: Taken: Pressure: Rate: Glucose (mg/dl): Meter #: Oximetry (%) Taken: Pre 15:01 114/67 67 16 97.5 Post 16:51 127/73 51 15 97.8 Treatment Response Treatment Toleration: Well Treatment Completion Status: Treatment Completed without Adverse Event Gianmarco Roye Notes No concerns with treatment given Physician HBO Attestation: I certify that I supervised this HBO treatment in accordance with Medicare guidelines. A trained emergency  response team is readily available per Yes hospital policies and procedures. Continue HBOT as ordered. Yes Electronic Signature(s) Signed: 08/11/2019 6:00:25 PM By: Linton Ham MD Previous Signature: 08/11/2019 5:18:56 PM Version By: Mikeal Hawthorne EMT/HBOT/SD Entered By: Linton Ham on 08/11/2019 17:58:28 -------------------------------------------------------------------------------- HBO Safety Checklist Details Patient Name: Date of Service: [name], [name] 08/11/2019 3:00 PM Medical Record Number: [id number] Patient Account Number: [id number] Date of Birth/Sex: Treating RN: [birth year] (86 y.o. [name] Primary Care Laiah Pouncey: PA [name], [place] Other Clinician: Mikeal Hawthorne Referring Judd Mccubbin: Treating Didier Brandenburg/Extender: [name] in Treatment: 10 HBO Safety Checklist Items Safety Checklist Consent Form Signed Patient voided / foley secured and emptied When did you last eato n/a Last dose of injectable or oral agent n/a Ostomy pouch emptied and vented if applicable NA All implantable devices assessed, documented and approved NA Intravenous access site secured and place NA Valuables secured Linens and cotton and cotton/polyester blend (less than 51% polyester) Personal oil-based products / skin lotions / body lotions removed Wigs or hairpieces removed NA Smoking or tobacco materials removed NA Books / newspapers / magazines / loose paper removed Cologne, aftershave, perfume and deodorant removed Jewelry removed (may wrap wedding band) Make-up removed Hair care products removed Battery operated devices (external) removed NA Heating patches and chemical warmers removed NA Titanium eyewear removed NA Nail polish cured greater than 10 hours NA Casting material cured greater than 10 hours NA Hearing aids removed NA Loose dentures or partials removed NA Prosthetics have been removed NA Patient demonstrates correct use of air break  device (if applicable) Patient concerns have been addressed Patient grounding bracelet on and cord attached to chamber Specifics for Inpatients (complete in addition to above) Medication sheet sent with patient Intravenous medications needed or due during therapy sent with patient Drainage tubes (e.g. nasogastric tube or chest tube  secured and vented) Endotracheal or Tracheotomy tube secured Cuff deflated of air and inflated with saline Airway suctioned Electronic Signature(s) Signed: 08/11/2019 3:26:58 PM By: Mikeal Hawthorne EMT/HBOT/SD Entered By: Mikeal Hawthorne on 08/11/2019 15:26:58

## 2019-08-11 NOTE — Progress Notes (Signed)
[name], [name] ([id number]) Visit Report for 08/11/2019 Arrival Information Details Patient Name: Date of Service: [place], [name] 08/11/2019 3:00 PM Medical Record Number: [id number] Patient Account Number: [id number] Date of Birth/Sex: Treating RN: 05-23-1929 (86 y.o. [name] Primary Care Braycen Burandt: PA [name], [place] Other Clinician: Mikeal Hawthorne Referring Anndee Connett: Treating Vannesa Abair/Extender: [name] in Treatment: 10 Visit Information History Since Last Visit Added or deleted any medications: No Patient Arrived: [name] Any new allergies or adverse reactions: No Arrival Time: 14:56 Had a fall or experienced change in No Accompanied By: self activities of daily living that may affect Transfer Assistance: None risk of falls: Patient Identification Verified: Yes Signs or symptoms of abuse/neglect since last visito No Secondary Verification Process Completed: Yes Hospitalized since last visit: No Patient Requires Transmission-Based Precautions: No Implantable device outside of the clinic excluding No Patient Has Alerts: No cellular tissue based products placed in the center since last visit: Pain Present Now: No Electronic Signature(s) Signed: 08/11/2019 5:18:56 PM By: Mikeal Hawthorne EMT/HBOT/SD Entered By: Mikeal Hawthorne on 08/11/2019 15:26:03 -------------------------------------------------------------------------------- Encounter Discharge Information Details Patient Name: Date of Service: Sara RER, [name] 08/11/2019 3:00 PM Medical Record Number: [id number] Patient Account Number: [id number] Date of Birth/Sex: Treating RN: 02/14/1929 (86 y.o. [name] Primary Care Aanshi Batchelder: PA [name], [place] Other Clinician: Mikeal Hawthorne Referring Lenor Provencher: Treating Germaine Ripp/Extender: [name] in Treatment: 10 Encounter Discharge Information Items Discharge Condition: Stable Ambulatory Status: Walker Discharge Destination:  Home Transportation: Private Auto Accompanied By: self Schedule Follow-up Appointment: Yes Clinical Summary of Care: Patient Declined Electronic Signature(s) Signed: 08/11/2019 5:18:56 PM By: Mikeal Hawthorne EMT/HBOT/SD Entered By: Mikeal Hawthorne on 08/11/2019 17:03:13 -------------------------------------------------------------------------------- Patient/Caregiver Education Details Patient Name: Date of Service: Sara Benne, [name] 7/26/2021andnbsp3:00 PM Medical Record Number: [id number] Patient Account Number: [id number] Date of Birth/Gender: Treating RN: 11/24/1929 (86 y.o. [name] Primary Care Physician: PA [name], [name] Other Clinician: Mikeal Hawthorne Referring Physician: Treating Physician/Extender: [name] in Treatment: 10 Education Assessment Education Provided To: Patient Education Topics Provided Hyperbaric Oxygenation: Methods: Explain/Verbal Responses: State content correctly Electronic Signature(s) Signed: 08/11/2019 5:18:56 PM By: Mikeal Hawthorne EMT/HBOT/SD Entered By: Mikeal Hawthorne on 08/11/2019 17:02:59 -------------------------------------------------------------------------------- Vitals Details Patient Name: Date of Service: Sara Benne, [name] 08/11/2019 3:00 PM Medical Record Number: [id number] Patient Account Number: [id number] Date of Birth/Sex: Treating RN: 02-23-1929 (86 y.o. [name] Primary Care Silvano Garofano: PA [name], [place] Other Clinician: Mikeal Hawthorne Referring Hans Rusher: Treating Kobey Sides/Extender: [name] in Treatment: 10 Vital Signs Time Taken: 15:01 Temperature (F): 97.5 Height (in): 65 Pulse (bpm): 67 Weight (lbs): 138 Respiratory Rate (breaths/min): 16 Body Mass Index (BMI): 23 Blood Pressure (mmHg): 114/67 Reference Range: 80 - 120 mg / dl Electronic Signature(s) Signed: 08/11/2019 5:18:56 PM By: Mikeal Hawthorne EMT/HBOT/SD Entered By: Mikeal Hawthorne on 08/11/2019 15:26:20

## 2019-08-11 NOTE — Progress Notes (Signed)
TOMASA, DOBRANSKY ([id number]) Visit Report for 08/11/2019 SuperBill Details Patient Name: Date of Service: [place], [name] 08/11/2019 Medical Record Number: [id number] Patient Account Number: [id number] Date of Birth/Sex: Treating RN: [birth year] (86 y.o. 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

## 2019-08-12 ENCOUNTER — Encounter (HOSPITAL_BASED_OUTPATIENT_CLINIC_OR_DEPARTMENT_OTHER): Payer: Medicare Other | Admitting: Internal Medicine

## 2019-08-12 ENCOUNTER — Other Ambulatory Visit: Payer: Self-pay

## 2019-08-12 DIAGNOSIS — L598 Other specified disorders of the skin and subcutaneous tissue related to radiation: Secondary | ICD-10-CM | POA: Diagnosis not present

## 2019-08-12 NOTE — Progress Notes (Signed)
[name], [name] ([id number]) Visit Report for 08/12/2019 Arrival Information Details Patient Name: Date of Service: [place], [name] 08/12/2019 3:00 PM Medical Record Number: [id number] Patient Account Number: [id number] Date of Birth/Sex: Treating RN: 08-26-1929 (86 y.o. [name] Primary Care Tanyia Grabbe: PA [name], [name] Other Clinician: Mikeal Hawthorne Referring Cheyann Blecha: Treating Alfonse Garringer/Extender: [name] in Treatment: 11 Visit Information History Since Last Visit Added or deleted any medications: No Patient Arrived: [name] Any new allergies or adverse reactions: No Arrival Time: 15:10 Had a fall or experienced change in No Accompanied By: self activities of daily living that may affect Transfer Assistance: None risk of falls: Patient Identification Verified: Yes Signs or symptoms of abuse/neglect since last visito No Secondary Verification Process Completed: Yes Hospitalized since last visit: No Patient Requires Transmission-Based Precautions: No Implantable device outside of the clinic excluding No Patient Has Alerts: No cellular tissue based products placed in the center since last visit: Pain Present Now: No Electronic Signature(s) Signed: 08/12/2019 5:30:44 PM By: Mikeal Hawthorne EMT/HBOT/SD Entered By: Mikeal Hawthorne on 08/12/2019 15:33:19 -------------------------------------------------------------------------------- Encounter Discharge Information Details Patient Name: Date of Service: Sara RER, [name] 08/12/2019 3:00 PM Medical Record Number: [id number] Patient Account Number: [id number] Date of Birth/Sex: Treating RN: [birth year] (86 y.o. [name] Primary Care Tauna Macfarlane: PA [name], [name] Other Clinician: Mikeal Hawthorne Referring Bobbi Kozakiewicz: Treating Ashleymarie Granderson/Extender: [name] in Treatment: 11 Encounter Discharge Information Items Discharge Condition: Stable Ambulatory Status: Walker Discharge Destination:  Home Transportation: Private Auto Accompanied By: self Schedule Follow-up Appointment: Yes Clinical Summary of Care: Patient Declined Electronic Signature(s) Signed: 08/12/2019 5:30:44 PM By: Mikeal Hawthorne EMT/HBOT/SD Entered By: Mikeal Hawthorne on 08/12/2019 17:28:40 -------------------------------------------------------------------------------- Patient/Caregiver Education Details Patient Name: Date of Service: [name] 7/27/2021andnbsp3:00 PM Medical Record Number: [id number] Patient Account Number: [id number] Date of Birth/Gender: Treating RN: January 11, 1930 (86 y.o. [name] Primary Care Physician: PA [name], [name] Other Clinician: Mikeal Hawthorne Referring Physician: Treating Physician/Extender: [name] in Treatment: 11 Education Assessment Education Provided To: Patient Education Topics Provided Hyperbaric Oxygenation: Methods: Explain/Verbal Responses: State content correctly Electronic Signature(s) Signed: 08/12/2019 5:30:44 PM By: Mikeal Hawthorne EMT/HBOT/SD Entered By: Mikeal Hawthorne on 08/12/2019 17:28:27 -------------------------------------------------------------------------------- Vitals Details Patient Name: Date of Service: Sara RER, [name] 08/12/2019 3:00 PM Medical Record Number: [id number] Patient Account Number: [id number] Date of Birth/Sex: Treating RN: Mar 06, 1929 (86 y.o. [name] Primary Care Nelta Caudill: PA [name], [name] Other Clinician: Mikeal Hawthorne Referring Thedford Bunton: Treating Vinia Jemmott/Extender: [name] in Treatment: 11 Vital Signs Time Taken: 15:15 Temperature (F): 98.4 Height (in): 65 Pulse (bpm): 69 Weight (lbs): 138 Respiratory Rate (breaths/min): 16 Body Mass Index (BMI): 23 Blood Pressure (mmHg): 120/74 Reference Range: 80 - 120 mg / dl Electronic Signature(s) Signed: 08/12/2019 5:30:44 PM By: Mikeal Hawthorne EMT/HBOT/SD Entered By: Mikeal Hawthorne on 08/12/2019 15:33:36

## 2019-08-12 NOTE — Progress Notes (Addendum)
[name], [name] ([id number]) Visit Report for 08/12/2019 HBO Details Patient Name: Date of Service: [place], [name] 08/12/2019 3:00 PM Medical Record Number: [id number] Patient Account Number: [id number] Date of Birth/Sex: Treating RN: [birth year] (86 y.o. 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 HBO Treatment Details Treatment Number: 28 Patient Type: Outpatient Chamber Type: Monoplace Chamber Serial #: [id number] Treatment Protocol: 2.0 ATA with 90 minutes oxygen, and no air breaks Treatment Details Compression Rate Down: 2.0 psi / minute De-Compression Rate Up: 2.0 psi / minute Air breaks and breathing Decompress Decompress Compress Tx Pressure Begins Reached periods Begins Ends (leave unused spaces blank) Chamber Pressure (ATA 1 2 ------2 1 ) Clock Time (24 hr) 15:19 15:27 - - - - - - 16:57 17:05 Treatment Length: 106 (minutes) Treatment Segments: 4 Vital Signs Capillary Blood Glucose Reference Range: 80 - 120 mg / dl HBO Diabetic Blood Glucose Intervention Range: <131 mg/dl or >249 mg/dl Time Vitals Blood Respiratory Capillary Blood Glucose Pulse Action Type: Pulse: Temperature: Taken: Pressure: Rate: Glucose (mg/dl): Meter #: Oximetry (%) Taken: Pre 15:15 120/74 69 16 98.4 Post 17:07 138/75 48 16 98.1 Treatment Response Treatment Toleration: Well Treatment Completion Status: Treatment Completed without Adverse Event Bowen Goyal Notes no concerns with treatment given. Patient also seen for wound care evaluation Physician HBO Attestation: I certify that I supervised this HBO treatment in accordance with  Medicare guidelines. A trained emergency response team is readily available per Yes hospital policies and procedures. Continue HBOT as ordered. Yes Electronic Signature(s) Signed: 08/13/2019 3:52:22 AM By: Linton Ham MD Previous Signature: 08/12/2019 5:30:44 PM Version By: Mikeal Hawthorne EMT/HBOT/SD Entered By: Linton Ham on 08/13/2019 03:48:18 -------------------------------------------------------------------------------- HBO Safety Checklist Details Patient Name: Date of Service: [facility], [name] 08/12/2019 3:00 PM Medical Record Number: [id number] Patient Account Number: [id number] Date of Birth/Sex: Treating RN: 12-Jun-1929 (86 y.o. [name] Primary Care Keon Benscoter: PA [name], [name] Other Clinician: Mikeal Hawthorne Referring Evelette Hollern: Treating Jonika Critz/Extender: [name] in Treatment: 11 HBO Safety Checklist Items Safety Checklist Consent Form Signed Patient voided / foley secured and emptied When did you last eato n/a Last dose of injectable or oral agent n/a Ostomy pouch emptied and vented if applicable NA All implantable devices assessed, documented and approved NA Intravenous access site secured and place NA Valuables secured Linens and cotton and cotton/polyester blend (less than 51% polyester) Personal oil-based products / skin lotions / body lotions removed Wigs or hairpieces removed NA Smoking or tobacco materials removed NA Books / newspapers / magazines / loose paper removed Cologne, aftershave, perfume and deodorant removed Jewelry removed (may wrap wedding band) Make-up removed NA Hair care products removed Battery operated devices (external) removed NA Heating patches and chemical warmers removed NA Titanium eyewear removed NA Nail polish cured greater than 10 hours NA Casting material cured greater than 10 hours NA Hearing aids removed NA Loose dentures or partials removed NA Prosthetics have been  removed NA Patient demonstrates correct use of air break device (if applicable) Patient concerns have been addressed Patient grounding bracelet on and cord attached to chamber Specifics for Inpatients (complete in addition to above) Medication sheet sent with patient Intravenous medications needed or due during therapy sent with patient  Drainage tubes (e.g. nasogastric tube or chest tube secured and vented) Endotracheal or Tracheotomy tube secured Cuff deflated of air and inflated with saline Airway suctioned Electronic Signature(s) Signed: 08/12/2019 3:34:13 PM By: Mikeal Hawthorne EMT/HBOT/SD Entered By: Mikeal Hawthorne on 08/12/2019 15:34:12

## 2019-08-13 ENCOUNTER — Encounter (HOSPITAL_BASED_OUTPATIENT_CLINIC_OR_DEPARTMENT_OTHER): Payer: Medicare Other | Admitting: Physician Assistant

## 2019-08-13 DIAGNOSIS — L598 Other specified disorders of the skin and subcutaneous tissue related to radiation: Secondary | ICD-10-CM | POA: Diagnosis not present

## 2019-08-13 NOTE — Progress Notes (Signed)
[name] ([id number]) Visit Report for 08/12/2019 Debridement Details Patient Name: Date of Service: [name], [name] 08/12/2019 2:15 PM Medical Record Number: [id number] Patient Account Number: [id number] Date of Birth/Sex: Treating RN: [birth year] (86 y.o. [name] Primary Care Provider: PA [name], [name] Other Clinician: Referring Provider: Treating Provider/Extender: [name] in Treatment: 11 Debridement Performed for Assessment: Wound #1 Left,Lateral Lower Leg Performed By: Physician Ricard Dillon., MD Debridement Type: Debridement Level of Consciousness (Pre-procedure): Awake and Alert Pre-procedure Verification/Time Out Yes - 14:57 Taken: Start Time: 14:57 Pain Control: Other : Benzocaine 20% T Area Debrided (L x W): otal 7.5 (cm) x 4 (cm) = 30 (cm) Tissue and other material debrided: Viable, Non-Viable, Slough, Subcutaneous, Slough Level: Skin/Subcutaneous Tissue Debridement Description: Excisional Instrument: Curette Bleeding: Moderate Hemostasis Achieved: Pressure End Time: 14:58 Procedural Pain: 4 Post Procedural Pain: 2 Response to Treatment: Procedure was tolerated well Level of Consciousness (Post- Awake and Alert procedure): Post Debridement Measurements of Total Wound Length: (cm) 7.5 Width: (cm) 4 Depth: (cm) 0.5 Volume: (cm) 11.781 Character of Wound/Ulcer Post Debridement: Requires Further Debridement Post Procedure Diagnosis Same as Pre-procedure Electronic Signature(s) Signed: 08/13/2019 3:52:22 AM By: Linton Ham MD Signed: 08/13/2019 6:04:59 PM By: Carlene Coria RN Entered By: Linton Ham on 08/12/2019 15:12:39 -------------------------------------------------------------------------------- HPI Details Patient Name: Date of Service: [name], Sara Sara A. 08/12/2019 2:15 PM Medical Record Number: [id number] Patient Account Number: [id number] Date of Birth/Sex: Treating RN: 1929/10/12 (86 y.o. [name] Primary Care Provider: PA [name], [name] Other Clinician: Referring Provider: Treating Provider/Extender: [name] in Treatment: 11 History of Present Illness HPI Description: ADMISSION 05/27/2019 This is an 86 year old woman who came here for consideration of hyperbaric oxygen. She has a very complicated history. She is been treated for diffuse large B-cell lymphoma since 2006. She has had 2 recurrences of this. Her most recent chemotherapy round was Treanda plus polatuzumab which ended I believe in February. She is not currently on chemotherapy. She came to see [place] with a large wound on the left lateral lower leg. Apparently this started as a lump sometime in October 2020. She received courses of antibiotics which did not help. She then had a biopsy and it actually showed lymphoma. The wound never really healed. And she developed a progressive ulcerated tumor. She underwent a course of radiation in late October into November 2020. This resulted in some improvement in the mass but she has been left with a deep punched out wound with exposed tendon. She was seen by Dr. Jaclynn Guarneri at the wound care center in [facility]. She considered her for operative debridement and/or consideration of hyperbaric oxygen. She was also seen in April on 05/07/2019 by Dr. Zigmund Daniel at the wound care center in [facility]. Arterial and venous reflux studies were ordered but I am unable to see these results. 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 She has been using [name] for a prolonged period of time for perhaps 5 weeks with some improvement in the wound surface. She has not had the wound rebiopsied but she did have a PET scan on 03/18/2027 that did show residual  infiltrating soft tissue density involving the left lower calf but without discrete measurable mass and minimal uptake. I think this is  felt to lend credibility to the fact that the primary mechanism here is radiation injury. At the same time there was no new areas of metabolically active lymphoma identified. She therefore has not been on any ongoing chemotherapy. She also had a CT scan of the left lower extremity on 05/21/2019 that did not show any fracture or dislocation of the left tibia or fibula large soft tissue ulcer overlying the distal tibia no evidence of bony erosion or sclerosis to suggest osteomyelitis. 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 , The patient has had prior recent arterial studies we did not do an ABI in this clinic today. 5/18; wound looks about the same. Certainly no improvement in condition of the wound bed. She has been using [name] change daily. Deep necrotic wound on the left lateral lower calf 6/1; absolutely no change in this wound. Completely nonviable surface with tendon in the middle. She ran out of [name] last week in my absence and has been using what sounds like Vache solution. I will refax the [name] to [facility] I have her echocardiogram report. Her valve area is 1.25 mild to moderate AS. I think this is slightly worse than 3 years ago but this should not preclude her treatment with hyperbarics. I managed to creep in Dr. Kennon Holter interaction with his own nurse with regards to this question and I think he is approved that. I will text him just to make sure 6/15; some improvement in the wound surface using [name] with daily dressing changes. She is washing this off daily. Patient started hyperbarics today. 6/29; patient seen today in conjunction with HBO. She is doing dive #11 today. 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 She is midway through her course. 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 She has been using [name]. Making excellent improvement. She is tolerating HBO well 7/27; P patient seen in conjunction with HBO. 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 We have been using [name]. Continued improvement. She is tolerating HBO well. Electronic Signature(s) Signed: 08/13/2019 3:52:22 AM By: Linton Ham MD Entered By: Linton Ham on 08/12/2019 15:13:07 -------------------------------------------------------------------------------- Physical Exam Details Patient Name: Date of Service: [name], Sara Sara A. 08/12/2019 2:15 PM Medical Record Number: [id number] Patient Account Number: [id number] Date of Birth/Sex: Treating RN: [birth year] (86 y.o. [name] Primary Care Provider: PA [name], [name] Other Clinician: Referring Provider: Treating Provider/Extender: [name] in Treatment: 11 Constitutional Sitting or standing Blood Pressure is within target range for patient.. Pulse regular and within target range for patient.Marland Kitchen Respirations regular, non-labored and within target range.. Temperature is normal and within the target range for the patient.Marland Kitchen Appears in no distress. Notes Wound exam; large punched-out area on the left lateral calf. There is still exposed tendon. I used an open curette to debride the surface of this. I am able to get to much better looking tissue with bleeding. This is an improvement in itself. No improvement in surface area perhaps slightly less deep overall than when she came in Electronic Signature(s) Signed: 08/13/2019 3:52:22 AM By: Linton Ham MD Entered By: Linton Ham on 08/12/2019 15:14:05 -------------------------------------------------------------------------------- Physician Orders Details Patient Name: Date of Service: [name], Sara Sara A. 08/12/2019 2:15 PM Medical Record Number: [id number] Patient  Account Number: [id number] Date of Birth/Sex: Treating RN: [birth year] (86 y.o. [name] Primary Care Provider: PA TIENT, [name] Other Clinician: Referring Provider: Treating Provider/Extender: [name] in Treatment: 11 Verbal / Phone Orders: No Diagnosis Coding ICD-10 Coding Code Description [phone] Non-pressure chronic ulcer of left calf with necrosis of muscle L59.9 Disorder of the skin and subcutaneous tissue related to radiation, unspecified C83.30 Diffuse large B-cell lymphoma, unspecified site Follow-up Appointments Return Appointment in 2 weeks. Dressing Change Frequency Wound #1 Left,Lateral Lower Leg Change Dressing every other day. Wound Cleansing Wound #1 Left,Lateral Lower Leg May shower and wash wound with soap and water. - on days that dressing is changed Primary Wound Dressing Wound #1 Left,Lateral Lower Leg Hydrofera Blue - over [name] [name] Ointment - under Hydrofera Blue Secondary Dressing Wound #1 Left,Lateral Lower Leg Kerlix/Rolled Gauze - secure with tape Dry Gauze Hyperbaric Oxygen Therapy Indication: - soft tissue radiation necrosis If appropriate for treatment, begin HBOT per protocol: 2.0 ATA for 90 Minutes without A Breaks ir Total Number of Treatments: - 40 treatments One treatments per day (delivered Monday through Friday unless otherwise specified in Special Instructions below): Consults Plastic Surgery - Non healing wound on left lower leg - (ICD10 L97.223 - Non-pressure chronic ulcer of left calf with necrosis of muscle) Patient Medications llergies: apixaban, prednisone, hydrocodone A Notifications Medication Indication Start End 08/12/2019 [name] DOSE topical 250 unit/gram ointment - ointment topical to wound change every 2nd day 7.5x4x0.5 Electronic Signature(s) Signed: 08/12/2019 3:18:19 PM By: Linton Ham MD Entered By: Linton Ham  on 08/12/2019 15:18:19 Prescription 08/12/2019 -------------------------------------------------------------------------------- Oswald Hillock, Fariha A. Linton Ham MD Patient Name: Provider: [birth year] [id number] Date of Birth: NPI#: [id number] Sex: DEA #: [phone] [id number] Phone #: License #: [place] Patient Address: [address] [place], [place] [address] [place], [place] [address] [phone] Allergies apixaban; prednisone; hydrocodone Provider's Orders Plastic Surgery - ICD10: L97.223 - Non healing wound on left lower leg Hand Signature: Date(s): Electronic Signature(s) Signed: 08/13/2019 3:52:22 AM By: Linton Ham MD Entered By: Linton Ham on 08/12/2019 15:18:20 -------------------------------------------------------------------------------- Problem List Details Patient Name: Date of Service: [name], Sara Sara A. 08/12/2019 2:15 PM Medical Record Number: [id number] Patient Account Number: [id number] Date of Birth/Sex: Treating RN: [birth year] (86 y.o. 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 08/12/2019 2:15  PM Medical Record Number: [id number] Patient Account Number: [id number] Date of Birth/Sex: Treating RN: 18-Oct-1929 (86 y.o. [name] Primary Care Provider: PA [name], [name] Other Clinician: Referring Provider: Treating Provider/Extender: [name] in Treatment: 11 Subjective History of Present Illness (HPI) ADMISSION 05/27/2019 This is an 86 year old woman who came here for consideration of hyperbaric oxygen. She has a very complicated history. She is been treated for diffuse large B-cell lymphoma since 2006. She has had 2 recurrences of this. Her most recent chemotherapy round was Treanda plus polatuzumab  which ended I believe in February. She is not currently on chemotherapy. She came to see [place] with a large wound on the left lateral lower leg. Apparently this started as a lump sometime in October 2020. She received courses of antibiotics which did not help. She then had a biopsy and it actually showed lymphoma. The wound never really healed. And she developed a progressive ulcerated tumor. She underwent a course of radiation in late October into November 2020. This resulted in some improvement in the mass but she has been left with a deep punched out wound with exposed tendon. She was seen by Dr. Jaclynn Guarneri at the wound care center in [facility]. She considered her for operative debridement and/or consideration of hyperbaric oxygen. She was also seen in April on 05/07/2019 by Dr. Zigmund Daniel at the wound care center in [facility]. Arterial and venous reflux studies were ordered but I am unable to see these results. I think preparations were being made for hyperbaric oxygen therapy but the patient lives in [place] and so she arrives here for consideration of hyperbaric oxygen for soft tissue radiation injury Other factors in this is that she does not tolerate compression because of pain. She has been using [name] for a prolonged period of time for perhaps 5 weeks with some  improvement in the wound surface. She has not had the wound rebiopsied but she did have a PET scan on 03/18/2027 that did show residual infiltrating soft tissue density involving the left lower calf but without discrete measurable mass and minimal uptake. 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 At the same time there was no new areas of metabolically active lymphoma identified. She therefore has not been on any ongoing chemotherapy. She also had a CT scan of the left lower extremity on 05/21/2019 that did not show any fracture or dislocation of the left tibia or fibula large soft tissue ulcer overlying the distal tibia no evidence of bony erosion or sclerosis to suggest osteomyelitis. 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 , The patient has had prior recent arterial studies we did not do an ABI in this clinic today. 5/18; wound looks about the same. Certainly no improvement in condition of the wound bed. She has been using [name] change daily. Deep necrotic wound on the left lateral lower calf 6/1; absolutely no change in this wound. Completely nonviable surface with tendon in the middle. She ran out of [name] last week in my absence and has been using what sounds like Vache solution. I will refax the [name] to [facility] I have her echocardiogram report. Her valve area is 1.25 mild to moderate AS. I think this is slightly worse than 3 years ago but this should not preclude her treatment with hyperbarics. I managed to creep in Dr. Kennon Holter interaction with his own nurse with regards to this question and I think he is approved that. I will text him just to make sure 6/15; some improvement in the wound surface using [name] with daily dressing changes. She is washing this off daily. Patient started hyperbarics today. 6/29;  patient seen today in conjunction with HBO. She is doing dive #11 today. 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 She is midway through her course.  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 She has been using [name]. Making excellent improvement. She is tolerating HBO well 7/27; P patient seen in conjunction with HBO. 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 We have been using [name]. Continued improvement. She is tolerating HBO well. Objective Constitutional Sitting or standing Blood Pressure is within target range for patient.. Pulse regular and within target range for patient.Marland Kitchen Respirations regular, non-labored and within target range.. Temperature is normal and within the target range for the patient.Marland Kitchen Appears in no distress. Vitals Time Taken: 2:38 PM, Height: 65 in, Weight: 138 lbs, BMI: 23, Temperature: 98.4 F, Pulse: 69 bpm, Respiratory Rate: 16 breaths/min, Blood Pressure: 120/74 mmHg. General Notes: Wound exam; large punched-out area on the left lateral calf. There is still exposed tendon. I used an open curette to debride the surface of this. I am able to get to much better looking tissue with bleeding. This is an improvement in itself. No improvement in surface area perhaps slightly less deep overall than when she came in Integumentary (Hair, Skin) Wound #1 status is Open. Original cause of wound was Bump. The wound is located on the Left,Lateral Lower Leg. The wound measures 7.5cm length x 4cm width x 0.5cm depth; 23.562cm^2 area and 11.781cm^3 volume. There is muscle, tendon, and Fat Layer (Subcutaneous Tissue) Exposed exposed. There is no tunneling or undermining noted. There is a medium amount of serous drainage noted. The wound margin is distinct with the outline attached to the wound base. There is small (1-33%) pink granulation within the wound  bed. There is a large (67-100%) amount of necrotic tissue within the wound bed including Adherent Slough. 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 There was a Excisional Skin/Subcutaneous Tissue Debridement with a total area of 30 sq cm performed by Ricard Dillon., MD. With the following instrument(s): Curette to remove Viable and Non-Viable tissue/material. Material removed includes Subcutaneous Tissue and Slough and after achieving pain control using Other (Benzocaine 20%). No specimens were taken. A time out was conducted at 14:57, prior to the start of the procedure. A Moderate amount of bleeding was controlled with Pressure. The procedure was tolerated well with a pain level of 4 throughout and a pain level of 2 following the procedure. Post Debridement Measurements: 7.5cm length x 4cm width x 0.5cm depth; 11.781cm^3 volume. Character of Wound/Ulcer Post Debridement requires further debridement. Post procedure Diagnosis Wound #1: Same as Pre-Procedure Plan Follow-up Appointments: Return Appointment in 2 weeks. Dressing Change Frequency: Wound #1 Left,Lateral Lower Leg: Change Dressing every other day. Wound Cleansing: Wound #1 Left,Lateral Lower Leg: May shower and wash wound with soap and water. - on days that dressing is changed Primary Wound Dressing: Wound #1 Left,Lateral Lower Leg: Hydrofera Blue - over [name] [name] Ointment - under Hydrofera Blue Secondary Dressing: Wound #1 Left,Lateral Lower Leg: Kerlix/Rolled Gauze - secure with tape Dry Gauze Hyperbaric Oxygen Therapy: Indication: - soft tissue radiation necrosis If appropriate for treatment, begin HBOT  per protocol: 2.0 ATA for 90 Minutes without Air Breaks T Number of Treatments: - 40 treatments otal One treatments per day (delivered Monday through Friday unless otherwise specified in Special Instructions below): Consults ordered were:  Plastic Surgery - Non healing wound on left lower leg The following medication(s) was prescribed: [name] topical 250 unit/gram ointment ointment topical to wound change every 2nd day 7.5x4x0.5 starting 08/12/2019 1. Continue with [name] with backing Hydrofera Blue. 2. I am going to refer to plastic surgery for options for wound closure i.e. flap closure. 3. My objective to date with HBO is to improve the viability the surface of the wound and I think we have done that. Electronic Signature(s) Signed: 08/12/2019 3:18:58 PM By: Linton Ham MD Entered By: Linton Ham on 08/12/2019 15:18:57 -------------------------------------------------------------------------------- SuperBill Details Patient Name: Date of Service: [name], Sara Sara A. 08/12/2019 Medical Record Number: [id number] Patient Account Number: [id number] Date of Birth/Sex: Treating RN: 05-24-1929 (86 y.o. 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 Modifier: Quantity: 1 CPT4 Code: [id number] Description: 88110 - DEB SUBQ TISS EA ADDL 20CM ICD-10 Diagnosis Description L97.223 Non-pressure chronic ulcer of left calf with necrosis of  muscle Modifier: Quantity: 1 Physician Procedures : CPT4 Code Description Modifier [id number] 11042 - WC PHYS SUBQ TISS 20 SQ CM ICD-10 Diagnosis Description L97.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

## 2019-08-13 NOTE — Progress Notes (Signed)
[name], [name] ([id number]) Visit Report for 08/13/2019 Arrival Information Details Patient Name: Date of Service: [place], [name] 08/13/2019 3:00 PM Medical Record Number: [id number] Patient Account Number: [id number] Date of Birth/Sex: Treating RN: [birth year] (86 y.o. [name] Primary Care Rakel Junio: Sara [name], [place] Other Clinician: Mikeal Hawthorne Referring Mahathi Pokorney: Treating Davie Claud/Extender: [name] in Treatment: 11 Visit Information History Since Last Visit Added or deleted any medications: No Patient Arrived: Walker Any new allergies or adverse reactions: No Arrival Time: 14:35 Had a fall or experienced change in No Accompanied By: self activities of daily living that may affect Transfer Assistance: None risk of falls: Patient Identification Verified: Yes Signs or symptoms of abuse/neglect since last visito No Secondary Verification Process Completed: Yes Hospitalized since last visit: No Patient Requires Transmission-Based Precautions: No Implantable device outside of the clinic excluding No Patient Has Alerts: No cellular tissue based products placed in the center since last visit: Pain Present Now: No Electronic Signature(s) Signed: 08/13/2019 5:12:26 PM By: Mikeal Hawthorne EMT/HBOT/SD Entered By: Mikeal Hawthorne on 08/13/2019 14:43:39 -------------------------------------------------------------------------------- Encounter Discharge Information Details Patient Name: Date of Service: [name], [name] A. 08/13/2019 3:00 PM Medical Record Number: [id number] Patient Account Number: [id number] Date of Birth/Sex: Treating RN: [birth year] (86 y.o. [name] Primary Care Arian Murley: Sara [name], [name] Other Clinician: Mikeal Hawthorne Referring Davy Faught: Treating Dariane Natzke/Extender: [name] in Treatment: 11 Encounter Discharge Information Items Discharge Condition: Stable Ambulatory Status: Walker Discharge Destination:  Home Transportation: Private Auto Accompanied By: self Schedule Follow-up Appointment: Yes Clinical Summary of Care: Patient Declined Electronic Signature(s) Signed: 08/13/2019 5:12:26 PM By: Mikeal Hawthorne EMT/HBOT/SD Entered By: Mikeal Hawthorne on 08/13/2019 16:42:20 -------------------------------------------------------------------------------- Patient/Caregiver Education Details Patient Name: Date of Service: [name], [name] Loni Muse 7/28/2021andnbsp3:00 PM Medical Record Number: [id number] Patient Account Number: [id number] Date of Birth/Gender: Treating RN: Oct 13, 1929 (86 y.o. [name] Primary Care Physician: Sara [name], [name] Other Clinician: Mikeal Hawthorne Referring Physician: Treating Physician/Extender: [name] in Treatment: 11 Education Assessment Education Provided To: Patient Education Topics Provided Hyperbaric Oxygenation: Methods: Explain/Verbal Responses: State content correctly Electronic Signature(s) Signed: 08/13/2019 5:12:26 PM By: Mikeal Hawthorne EMT/HBOT/SD Entered By: Mikeal Hawthorne on 08/13/2019 16:42:05 -------------------------------------------------------------------------------- Vitals Details Patient Name: Date of Service: [name], [name] A. 08/13/2019 3:00 PM Medical Record Number: [id number] Patient Account Number: [id number] Date of Birth/Sex: Treating RN: 12-03-1929 (86 y.o. [name] Primary Care Atia Haupt: Sara [name], [name] Other Clinician: Mikeal Hawthorne Referring Yentl Verge: Treating Braedan Meuth/Extender: [name] in Treatment: 11 Vital Signs Time Taken: 14:40 Temperature (F): 97.5 Height (in): 65 Pulse (bpm): 70 Weight (lbs): 138 Respiratory Rate (breaths/min): 17 Body Mass Index (BMI): 23 Blood Pressure (mmHg): 122/73 Reference Range: 80 - 120 mg / dl Electronic Signature(s) Signed: 08/13/2019 5:12:26 PM By: Mikeal Hawthorne EMT/HBOT/SD Entered By: Mikeal Hawthorne on 08/13/2019 14:43:55

## 2019-08-13 NOTE — Progress Notes (Addendum)
Sara, [name] ([id number]) Visit Report for 08/13/2019 HBO Details Patient Name: Date of Service: [place], [name] 08/13/2019 3:00 PM Medical Record Number: [id number] Patient Account Number: [id number] Date of Birth/Sex: Treating RN: 1929-10-12 (86 y.o. 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 HBO Treatment Details Treatment Number: 29 Patient Type: Outpatient Chamber Type: Monoplace Chamber Serial #: [id number] Treatment Protocol: 2.0 ATA with 90 minutes oxygen, and no air breaks Treatment Details Compression Rate Down: 2.0 psi / minute De-Compression Rate Up: 2.0 psi / minute Air breaks and breathing Decompress Decompress Compress Tx Pressure Begins Reached periods Begins Ends (leave unused spaces blank) Chamber Pressure (ATA 1 2 ------2 1 ) Clock Time (24 hr) 14:50 14:58 - - - - - - 16:28 16:36 Treatment Length: 106 (minutes) Treatment Segments: 4 Vital Signs Capillary Blood Glucose Reference Range: 80 - 120 mg / dl HBO Diabetic Blood Glucose Intervention Range: <131 mg/dl or >249 mg/dl Time Vitals Blood Respiratory Capillary Blood Glucose Pulse Action Type: Pulse: Temperature: Taken: Pressure: Rate: Glucose (mg/dl): Meter #: Oximetry (%) Taken: Pre 14:40 122/73 70 17 97.5 Post 16:38 136/76 61 15 97.8 Treatment Response Treatment Toleration: Well Treatment Completion Status: Treatment Completed without Adverse Event Electronic Signature(s) Signed: 08/13/2019 5:12:26 PM By: Mikeal Hawthorne EMT/HBOT/SD Signed: 08/13/2019 6:41:43 PM By: Worthy Keeler PA-C Entered By: Mikeal Hawthorne on 08/13/2019  16:41:43 -------------------------------------------------------------------------------- HBO Safety Checklist Details Patient Name: Date of Service: Sara Benne, [name] 08/13/2019 3:00 PM Medical Record Number: [id number] Patient Account Number: [id number] Date of Birth/Sex: Treating RN: 1929-02-07 (86 y.o. [name] Primary Care Dylin Ihnen: PA [name], [place] Other Clinician: Mikeal Hawthorne Referring Jacynda Brunke: Treating Bernita Beckstrom/Extender: [name] in Treatment: 11 HBO Safety Checklist Items Safety Checklist Consent Form Signed Patient voided / foley secured and emptied When did you last eato n/a Last dose of injectable or oral agent n/a Ostomy pouch emptied and vented if applicable NA All implantable devices assessed, documented and approved NA Intravenous access site secured and place NA Valuables secured Linens and cotton and cotton/polyester blend (less than 51% polyester) Personal oil-based products / skin lotions / body lotions removed Wigs or hairpieces removed NA Smoking or tobacco materials removed NA Books / newspapers / magazines / loose paper removed Cologne, aftershave, perfume and deodorant removed Jewelry removed (may wrap wedding band) Make-up removed Hair care products removed Battery operated devices (external) removed NA Heating patches and chemical warmers removed NA Titanium eyewear removed NA Nail polish cured greater than 10 hours NA Casting material cured greater than 10 hours NA Hearing aids removed NA Loose dentures or partials removed NA Prosthetics have been removed NA Patient demonstrates correct use of air break device (if applicable) Patient concerns have been addressed Patient grounding bracelet on and cord attached to chamber Specifics for Inpatients (complete in addition to above) Medication sheet sent with patient Intravenous medications needed or due during therapy sent with patient Drainage tubes (e.g.  nasogastric tube or chest tube secured and vented) Endotracheal or Tracheotomy tube secured Cuff deflated of air and inflated with saline Airway suctioned Electronic Signature(s) Signed: 08/13/2019 2:44:31 PM By: Mikeal Hawthorne EMT/HBOT/SD Entered By: Mikeal Hawthorne on 08/13/2019 14:44:31

## 2019-08-13 NOTE — Progress Notes (Signed)
[name], [name] ([id number]) Visit Report for 08/12/2019 Arrival Information Details Patient Name: Date of Service: [place], [name] 08/12/2019 2:15 PM Medical Record Number: [id number] Patient Account Number: [id number] Date of Birth/Sex: Treating RN: 1929-02-24 (86 y.o. [name] Primary Care Yeraldi Fidler: PA [name], [place] Other Clinician: Referring Graden Hoshino: Treating Keymon Mcelroy/Extender: [name] in Treatment: 11 Visit Information History Since Last Visit Added or deleted any medications: No Patient Arrived: Walker Any new allergies or adverse reactions: No Arrival Time: 14:37 Had a fall or experienced change in No Accompanied By: alone activities of daily living that may affect Transfer Assistance: None risk of falls: Patient Identification Verified: Yes Signs or symptoms of abuse/neglect since last visito No Secondary Verification Process Completed: Yes Hospitalized since last visit: No Patient Requires Transmission-Based Precautions: No Implantable device outside of the clinic excluding No Patient Has Alerts: No cellular tissue based products placed in the center since last visit: Has Dressing in Place as Prescribed: Yes Pain Present Now: No Electronic Signature(s) Signed: 08/13/2019 6:13:34 PM By: Levan Hurst RN, BSN Entered By: Levan Hurst on 08/12/2019 14:37:46 -------------------------------------------------------------------------------- Encounter Discharge Information Details Patient Name: Date of Service: Sara RER, [name] 08/12/2019 2:15 PM Medical Record Number: [id number] Patient Account Number: [id number] Date of Birth/Sex: Treating RN: [birth year] (86 y.o. [name] Primary Care Demisha Nokes: PA [name], [place] Other Clinician: Referring Tanice Petre: Treating Amylee Lodato/Extender: [name] in Treatment: 11 Encounter Discharge Information Items Post Procedure Vitals Discharge Condition: Stable Temperature (F):  98.4 Ambulatory Status: Walker Pulse (bpm): 69 Discharge Destination: Home Respiratory Rate (breaths/min): 16 Transportation: Private Auto Blood Pressure (mmHg): 120/74 Accompanied By: alone Schedule Follow-up Appointment: Yes Clinical Summary of Care: Patient Declined Electronic Signature(s) Signed: 08/13/2019 6:13:34 PM By: Levan Hurst RN, BSN Entered By: Levan Hurst on 08/12/2019 17:51:22 -------------------------------------------------------------------------------- Lower Extremity Assessment Details Patient Name: Date of Service: Sara RER, [name] 08/12/2019 2:15 PM Medical Record Number: [id number] Patient Account Number: [id number] Date of Birth/Sex: Treating RN: [birth year] (86 y.o. [name] Primary Care Nadean Montanaro: PA [name], [place] Other Clinician: Referring Adyn Hoes: Treating Diann Bangerter/Extender: [name] in Treatment: 11 Edema Assessment Assessed: [Left: No] [Right: No] Edema: [Left: Ye] [Right: s] Calf Left: Right: Point of Measurement: cm From Medial Instep 33 cm cm Ankle Left: Right: Point of Measurement: cm From Medial Instep 19 cm cm Vascular Assessment Pulses: Dorsalis Pedis Palpable: [Left:Yes] Electronic Signature(s) Signed: 08/13/2019 6:13:34 PM By: Levan Hurst RN, BSN Entered By: Levan Hurst on 08/12/2019 14:45:04 -------------------------------------------------------------------------------- Multi Wound Chart Details Patient Name: Date of Service: Sara Benne, [name] 08/12/2019 2:15 PM Medical Record Number: [id number] Patient Account Number: [id number] Date of Birth/Sex: Treating RN: September 08, 1929 (86 y.o. [name] Primary Care Caterine Mcmeans: PA [name], [name] Other Clinician: Referring Ashlynn Gunnels: Treating Mikael Debell/Extender: [name] in Treatment: 11 Vital Signs Height(in): 65 Pulse(bpm): 69 Weight(lbs): 138 Blood Pressure(mmHg): 120/74 Body Mass Index(BMI): 23 Temperature(F): 98.4 Respiratory  Rate(breaths/min): 16 Photos: [1:No Photos Left, Lateral Lower Leg] [N/A:N/A N/A] Wound Location: [1:Bump] [N/A:N/A] Wounding Event: [1:Soft Tissue Radionecrosis] [N/A:N/A] Primary Etiology: [1:Cataracts, Deep Vein Thrombosis,] [N/A:N/A] Comorbid History: [1:Hypertension, Osteoarthritis, Neuropathy, Received Chemotherapy, Received Radiation, Confinement Anxiety 10/17/2018] [N/A:N/A] Date Acquired: [1:11] [N/A:N/A] Weeks of Treatment: [1:Open] [N/A:N/A] Wound Status: [1:7.5x4x0.5] [N/A:N/A] Measurements L x W x D (cm) [1:23.562] [N/A:N/A] A (cm) : rea [1:11.781] [N/A:N/A] Volume (cm) : [1:-19.20%] [N/A:N/A] % Reduction in Area: [1:-49.10%] [N/A:N/A] % Reduction in Volume: [1:Full Thickness With Exposed Support] [N/A:N/A] Classification: [1:Structures Medium] [N/A:N/A] Exudate A mount: [1:Serous] [  N/A:N/A] Exudate Type: [1:amber] [N/A:N/A] Exudate Color: [1:Distinct, outline attached] [N/A:N/A] Wound Margin: [1:Small (1-33%)] [N/A:N/A] Granulation A mount: [1:Pink] [N/A:N/A] Granulation Quality: [1:Large (67-100%)] [N/A:N/A] Necrotic A mount: [1:Fat Layer (Subcutaneous Tissue)] [N/A:N/A] Exposed Structures: [1:Exposed: Yes Tendon: Yes Muscle: Yes Fascia: No Joint: No Bone: No Small (1-33%)] [N/A:N/A] Epithelialization: [1:Debridement - Excisional] [N/A:N/A] Debridement: Pre-procedure Verification/Time Out 14:57 [N/A:N/A] Taken: [1:Other] [N/A:N/A] Pain Control: [1:Subcutaneous, Slough] [N/A:N/A] Tissue Debrided: [1:Skin/Subcutaneous Tissue] [N/A:N/A] Level: [1:30] [N/A:N/A] Debridement A (sq cm): [1:rea Curette] [N/A:N/A] Instrument: [1:Moderate] [N/A:N/A] Bleeding: [1:Pressure] [N/A:N/A] Hemostasis A chieved: [1:4] [N/A:N/A] Procedural Pain: [1:2] [N/A:N/A] Post Procedural Pain: [1:Procedure was tolerated well] [N/A:N/A] Debridement Treatment Response: [1:7.5x4x0.5] [N/A:N/A] Post Debridement Measurements L x W x D (cm) [1:11.781] [N/A:N/A] Post Debridement Volume: (cm)  [1:Debridement] [N/A:N/A] Treatment Notes Electronic Signature(s) Signed: 08/13/2019 3:52:22 AM By: Linton Ham MD Signed: 08/13/2019 6:04:59 PM By: Carlene Coria RN Entered By: Linton Ham on 08/12/2019 15:12:31 -------------------------------------------------------------------------------- Multi-Disciplinary Care Plan Details Patient Name: Date of Service: [place], [name] 08/12/2019 2:15 PM Medical Record Number: [id number] Patient Account Number: [id number] Date of Birth/Sex: Treating RN: 1929-10-14 (86 y.o. [name] Primary Care Manroop Jakubowicz: PA [name], [name] Other Clinician: Referring Nazeer Romney: Treating Anetria Harwick/Extender: [name] in Treatment: 11 Active Inactive HBO Nursing Diagnoses: Anxiety related to feelings of confinement associated with the hyperbaric oxygen chamber Anxiety related to knowledge deficit of hyperbaric oxygen therapy and treatment procedures Goals: Patient and/or family will be able to state/discuss factors appropriate to the management of their disease process during treatment Date Initiated: 05/27/2019 T arget Resolution Date: 06/27/2019 Goal Status: Active Patient will tolerate the hyperbaric oxygen therapy treatment Date Initiated: 05/27/2019 T arget Resolution Date: 06/27/2019 Goal Status: Active Patient will tolerate the internal climate of the chamber Date Initiated: 05/27/2019 T arget Resolution Date: 06/27/2019 Goal Status: Active Patient/caregiver will verbalize understanding of HBO goals, rationale, procedures and potential hazards Date Initiated: 05/27/2019 T arget Resolution Date: 06/27/2019 Goal Status: Active Interventions: Administer a five (5) minute air break for patient if signs and symptoms of seizure appear and notify the hyperbaric physician Administer decongestants, per physician orders, prior to HBO2 Administer the correct therapeutic gas delivery based on the patients needs and limitations, per physician  order Assess and provide for patients comfort related to the hyperbaric environment and equalization of middle ear Assess for signs and symptoms related to adverse events, including but not limited to confinement anxiety, pneumothorax, oxygen toxicity and baurotrauma Assess patient's knowledge and expectations regarding hyperbaric medicine and provide education related to the hyperbaric environment, goals of treatment and prevention of adverse events Notes: Wound/Skin Impairment Nursing Diagnoses: Knowledge deficit related to ulceration/compromised skin integrity Goals: Patient/caregiver will verbalize understanding of skin care regimen Date Initiated: 05/27/2019 Target Resolution Date: 07/31/2019 Goal Status: Active Ulcer/skin breakdown will have a volume reduction of 30% by week 4 Date Initiated: 05/27/2019 Date Inactivated: 07/01/2019 Target Resolution Date: 06/27/2019 Goal Status: Unmet Unmet Reason: comorbities Ulcer/skin breakdown will have a volume reduction of 50% by week 8 Date Initiated: 07/01/2019 Target Resolution Date: 07/31/2019 Goal Status: Active Interventions: Assess patient/caregiver ability to obtain necessary supplies Assess patient/caregiver ability to perform ulcer/skin care regimen upon admission and as needed Assess ulceration(s) every visit Notes: Electronic Signature(s) Signed: 08/13/2019 6:04:59 PM By: Carlene Coria RN Entered By: Carlene Coria on 08/12/2019 14:57:38 -------------------------------------------------------------------------------- Pain Assessment Details Patient Name: Date of Service: [place], [name] 08/12/2019 2:15 PM Medical Record Number: [id number] Patient Account Number: [id number] Date of Birth/Sex: Treating RN: September 05, 1929 (86 y.o.  [name] Primary Care Kyrstal Monterrosa: PA [name], [place] Other Clinician: Referring Jadwiga Faidley: Treating Seva Chancy/Extender: [name] in Treatment: 11 Active Problems Location of Pain Severity  and Description of Pain Patient Has Paino No Site Locations Pain Management and Medication Current Pain Management: Electronic Signature(s) Signed: 08/13/2019 6:13:34 PM By: Levan Hurst RN, BSN Entered By: Levan Hurst on 08/12/2019 14:39:09 -------------------------------------------------------------------------------- Wound Assessment Details Patient Name: Date of Service: Sara RER, [name] 08/12/2019 2:15 PM Medical Record Number: [id number] Patient Account Number: [id number] Date of Birth/Sex: Treating RN: [birth year] (86 y.o. [name] Primary Care Ollie Delano: PA [name], [place] Other Clinician: Referring Sanaz Scarlett: Treating Mckynzi Cammon/Extender: [name] in Treatment: 11 Wound Status Wound Number: 1 Primary Soft Tissue Radionecrosis Etiology: Wound Location: Left, Lateral Lower Leg Wound Open Wounding Event: Bump Status: Date Acquired: 10/17/2018 Comorbid Cataracts, Deep Vein Thrombosis, Hypertension, Osteoarthritis, Weeks Of Treatment: 11 History: Neuropathy, Received Chemotherapy, Received Radiation, Clustered Wound: No Confinement Anxiety Photos Photo Uploaded By: Mikeal Hawthorne on 08/13/2019 12:47:27 Wound Measurements Length: (cm) 7.5 Width: (cm) 4 Depth: (cm) 0.5 Area: (cm) 23.562 Volume: (cm) 11.781 % Reduction in Area: -19.2% % Reduction in Volume: -49.1% Epithelialization: Small (1-33%) Tunneling: No Undermining: No Wound Description Classification: Full Thickness With Exposed Support Structures Wound Margin: Distinct, outline attached Exudate Amount: Medium Exudate Type: Serous Exudate Color: amber Foul Odor After Cleansing: No Slough/Fibrino Yes Wound Bed Granulation Amount: Small (1-33%) Exposed Structure Granulation Quality: Pink Fascia Exposed: No Necrotic Amount: Large (67-100%) Fat Layer (Subcutaneous Tissue) Exposed: Yes Necrotic Quality: Adherent Slough Tendon Exposed: Yes Muscle Exposed: Yes Necrosis of Muscle:  No Joint Exposed: No Bone Exposed: No Treatment Notes Wound #1 (Left, Lateral Lower Leg) 1. Cleanse With Wound Cleanser 3. Primary Dressing Applied [name] Hydrofera Blue 4. Secondary Dressing Dry Gauze Roll Gauze 5. Secured With Recruitment consultant) Signed: 08/13/2019 6:13:34 PM By: Levan Hurst RN, BSN Entered By: Levan Hurst on 08/12/2019 14:44:12 -------------------------------------------------------------------------------- [place] Details Patient Name: Date of Service: Sara RER, [name] 08/12/2019 2:15 PM Medical Record Number: [id number] Patient Account Number: [id number] Date of Birth/Sex: Treating RN: August 06, 1929 (86 y.o. [name] Primary Care Romy Ipock: PA [place], [place] Other Clinician: Referring Ishanvi Mcquitty: Treating Salvadore Valvano/Extender: [name] in Treatment: 11 Vital Signs Time Taken: 14:38 Temperature (F): 98.4 Height (in): 65 Pulse (bpm): 69 Weight (lbs): 138 Respiratory Rate (breaths/min): 16 Body Mass Index (BMI): 23 Blood Pressure (mmHg): 120/74 Reference Range: 80 - 120 mg / dl Electronic Signature(s) Signed: 08/13/2019 6:13:34 PM By: Levan Hurst RN, BSN Entered By: Levan Hurst on 08/12/2019 14:39:04

## 2019-08-13 NOTE — Progress Notes (Signed)
POETRY, CERRO ([id number]) Visit Report for 08/13/2019 SuperBill Details Patient Name: Date of Service: [place], [name] 08/13/2019 Medical Record Number: [id number] Patient Account Number: [id number] Date of Birth/Sex: Treating RN: Apr 08, 1929 (86 y.o. 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

## 2019-08-13 NOTE — Progress Notes (Signed)
[name], [name] ([id number]) Visit Report for 08/12/2019 SuperBill Details Patient Name: Date of Service: [place], [name] 08/12/2019 Medical Record Number: [id number] Patient Account Number: [id number] Date of Birth/Sex: Treating RN: 1929/02/06 (86 y.o. 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

## 2019-08-14 ENCOUNTER — Encounter (HOSPITAL_BASED_OUTPATIENT_CLINIC_OR_DEPARTMENT_OTHER): Payer: Medicare Other | Admitting: Internal Medicine

## 2019-08-15 ENCOUNTER — Other Ambulatory Visit: Payer: Self-pay

## 2019-08-15 ENCOUNTER — Encounter (HOSPITAL_BASED_OUTPATIENT_CLINIC_OR_DEPARTMENT_OTHER): Payer: Medicare Other | Admitting: Physician Assistant

## 2019-08-15 DIAGNOSIS — L598 Other specified disorders of the skin and subcutaneous tissue related to radiation: Secondary | ICD-10-CM | POA: Diagnosis not present

## 2019-08-15 NOTE — Progress Notes (Signed)
ZOWIE, LUNDAHL ([id number]) Visit Report for 08/15/2019 SuperBill Details Patient Name: Date of Service: [place], [name] 08/15/2019 Medical Record Number: [id number] Patient Account Number: [id number] Date of Birth/Sex: Treating RN: 1929/05/01 (86 y.o. 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, unspecified C83.30 Diffuse large B-cell lymphoma, unspecified site Facility Procedures CPT4 Code Description Modifier Quantity 86168372 G0277-(Facility Use Only) HBOT full body chamber, 61min , 4 Physician Procedures Quantity CPT4 Code Description Modifier 9021115 52080 - WC PHYS HYPERBARIC OXYGEN THERAPY 1 ICD-10 Diagnosis Description 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

## 2019-08-20 ENCOUNTER — Encounter (HOSPITAL_BASED_OUTPATIENT_CLINIC_OR_DEPARTMENT_OTHER): Payer: Medicare Other | Attending: Physician Assistant | Admitting: Physician Assistant

## 2019-08-20 DIAGNOSIS — Y842 Radiological procedure and radiotherapy as the cause of abnormal reaction of the patient, or of later complication, without mention of misadventure at the time of the procedure: Secondary | ICD-10-CM | POA: Diagnosis not present

## 2019-08-20 DIAGNOSIS — Z9221 Personal history of antineoplastic chemotherapy: Secondary | ICD-10-CM | POA: Diagnosis not present

## 2019-08-20 DIAGNOSIS — L97223 Non-pressure chronic ulcer of left calf with necrosis of muscle: Secondary | ICD-10-CM | POA: Insufficient documentation

## 2019-08-20 DIAGNOSIS — L598 Other specified disorders of the skin and subcutaneous tissue related to radiation: Secondary | ICD-10-CM | POA: Diagnosis not present

## 2019-08-20 DIAGNOSIS — C8330 Diffuse large B-cell lymphoma, unspecified site: Secondary | ICD-10-CM | POA: Insufficient documentation

## 2019-08-21 ENCOUNTER — Encounter (HOSPITAL_BASED_OUTPATIENT_CLINIC_OR_DEPARTMENT_OTHER): Payer: Medicare Other | Admitting: Physician Assistant

## 2019-08-21 DIAGNOSIS — L97223 Non-pressure chronic ulcer of left calf with necrosis of muscle: Secondary | ICD-10-CM | POA: Diagnosis not present

## 2019-08-21 NOTE — Progress Notes (Signed)
QUORRA, ROSENE ([id number]) Visit Report for 08/21/2019 SuperBill Details Patient Name: Date of Service: [place], [name] 08/21/2019 Medical Record Number: [id number] Patient Account Number: [id number] Date of Birth/Sex: Treating RN: Nov 16, 1929 (86 y.o. 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, unspecified C83.30 Diffuse large B-cell lymphoma, unspecified site Facility Procedures CPT4 Code Description Modifier Quantity [id number] G0277-(Facility Use Only) HBOT full body chamber, 60min , 4 Physician Procedures Quantity CPT4 Code Description Modifier 1100349 61164 - WC PHYS HYPERBARIC OXYGEN THERAPY 1 ICD-10 Diagnosis Description 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

## 2019-08-22 ENCOUNTER — Other Ambulatory Visit: Payer: Self-pay

## 2019-08-22 ENCOUNTER — Encounter (HOSPITAL_BASED_OUTPATIENT_CLINIC_OR_DEPARTMENT_OTHER): Payer: Medicare Other | Admitting: Physician Assistant

## 2019-08-22 DIAGNOSIS — L97223 Non-pressure chronic ulcer of left calf with necrosis of muscle: Secondary | ICD-10-CM | POA: Diagnosis not present

## 2019-08-22 NOTE — Progress Notes (Signed)
[name] ([id number]) Visit Report for 08/20/2019 HBO Details Patient Name: Date of Service: [name] Sara A. 08/20/2019 3:00 PM Medical Record Number: [id number] Patient Account Number: [id number] Date of Birth/Sex: Treating RN: Oct 28, 1929 (86 y.o. 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 HBO Treatment Details Treatment Number: 31 Patient Type: Outpatient Chamber Type: Monoplace Chamber Serial #: [id number] Treatment Protocol: 2.0 ATA with 90 minutes oxygen, and no air breaks Treatment Details Compression Rate Down: 2.0 psi / minute De-Compression Rate Up: 3.0 psi / minute Air breaks and breathing Decompress Decompress Compress Tx Pressure Begins Reached periods Begins Ends (leave unused spaces blank) Chamber Pressure (ATA 1 2 ------2 1 ) Clock Time (24 hr) 15:04 15:10 - - - - - - 16:43 16:50 Treatment Length: 106 (minutes) Treatment Segments: 4 Vital Signs Capillary Blood Glucose Reference Range: 80 - 120 mg / dl HBO Diabetic Blood Glucose Intervention Range: <131 mg/dl or >249 mg/dl Time Vitals Blood Respiratory Capillary Blood Glucose Pulse Action Type: Pulse: Temperature: Taken: Pressure: Rate: Glucose (mg/dl): Meter #: Oximetry (%) Taken: Post 16:52 140/64 52 16 98.1 Treatment Response Treatment Toleration: Well Treatment Completion Status: Treatment Completed without Adverse Event Electronic Signature(s) Signed: 08/20/2019 5:10:43 PM By: Worthy Keeler PA-C Signed: 08/22/2019 5:14:43 PM By: [name] Entered By: [name] on 08/20/2019  16:49:24 -------------------------------------------------------------------------------- HBO Safety Checklist Details Patient Name: Date of Service: Sara RER, [name] 08/20/2019 3:00 PM Medical Record Number: [id number] Patient Account Number: [id number] Date of Birth/Sex: Treating RN: 1929/10/11 (86 y.o. [name] Primary Care [name]: PA [name], [name] Other Clinician: Minerva [name] Referring Justinian [name]: Treating [name]/Extender: [name] in Treatment: 12 HBO Safety Checklist Items Safety Checklist Consent Form Signed Patient voided / foley secured and emptied When did you last eato N/A Last dose of injectable or oral agent Ostomy pouch emptied and vented if applicable NA All implantable devices assessed, documented and approved NA Intravenous access site secured and place NA Valuables secured Linens and cotton and cotton/polyester blend (less than 51% polyester) Personal oil-based products / skin lotions / body lotions removed Wigs or hairpieces removed Smoking or tobacco materials removed NA Books / newspapers / magazines / loose paper removed Cologne, aftershave, perfume and deodorant removed Jewelry removed (may wrap wedding band) Make-up removed Hair care products removed Battery operated devices (external) removed NA Heating patches and chemical warmers removed NA Titanium eyewear removed NA Nail polish cured greater than 10 hours NA Casting material cured greater than 10 hours NA Hearing aids removed NA Loose dentures or partials removed NA Prosthetics have been removed NA Patient demonstrates correct use of air break device (if applicable) Patient concerns have been addressed Patient grounding bracelet on and cord attached to chamber Specifics for Inpatients (complete in addition to above) Medication sheet sent with patient Intravenous medications needed or due during therapy sent with patient Drainage tubes (e.g. nasogastric  tube or chest tube secured and vented) Endotracheal or Tracheotomy tube secured Cuff deflated of air and inflated with saline Airway suctioned Electronic Signature(s) Signed: 08/22/2019 5:14:43 PM By: [name] Entered By: [name] on 08/20/2019 16:44:28

## 2019-08-22 NOTE — Progress Notes (Signed)
[name], [name] ([id number]) Visit Report for 08/21/2019 HBO Details Patient Name: Date of Service: [place], [name] 08/21/2019 3:00 PM Medical Record Number: [id number] Patient Account Number: [id number] Date of Birth/Sex: Treating RN: 02/07/1929 (86 y.o. 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 HBO Treatment Details Treatment Number: 32 Patient Type: Outpatient Chamber Type: Monoplace Chamber Serial #: [id number] Treatment Protocol: 2.0 ATA with 90 minutes oxygen, and no air breaks Treatment Details Compression Rate Down: 2.0 psi / minute De-Compression Rate Up: 2.0 psi / minute Air breaks and breathing Decompress Decompress Compress Tx Pressure Begins Reached periods Begins Ends (leave unused spaces blank) Chamber Pressure (ATA 1 2 ------2 1 ) Clock Time (24 hr) 14:49 14:57 - - - - - - 16:27 16:35 Treatment Length: 106 (minutes) Treatment Segments: 4 Vital Signs Capillary Blood Glucose Reference Range: 80 - 120 mg / dl HBO Diabetic Blood Glucose Intervention Range: <131 mg/dl or >249 mg/dl Time Vitals Blood Respiratory Capillary Blood Glucose Pulse Action Type: Pulse: Temperature: Taken: Pressure: Rate: Glucose (mg/dl): Meter #: Oximetry (%) Taken: Pre 14:37 120/65 70 14 98 Post 16:37 141/66 51 16 98 Treatment Response Treatment Toleration: Well Treatment Completion Status: Treatment Completed without Adverse Event Electronic Signature(s) Signed: 08/21/2019 5:23:33 PM By: Mikeal Hawthorne EMT/HBOT/SD Signed: 08/21/2019 6:02:08 PM By: Worthy Keeler PA-C Entered By: Mikeal Hawthorne on 08/21/2019  17:21:14 -------------------------------------------------------------------------------- HBO Safety Checklist Details Patient Name: Date of Service: [name], [name] 08/21/2019 3:00 PM Medical Record Number: [id number] Patient Account Number: [id number] Date of Birth/Sex: Treating RN: 15-Oct-1929 (86 y.o. [name] Primary Care Amarra Sawyer: PA [name], NO Other Clinician: [name] Referring Aireana Ryland: Treating Arsenio Schnorr/Extender: [name] in Treatment: 12 HBO Safety Checklist Items Safety Checklist Consent Form Signed Patient voided / foley secured and emptied When did you last eato N/A Last dose of injectable or oral agent Ostomy pouch emptied and vented if applicable NA All implantable devices assessed, documented and approved NA Intravenous access site secured and place NA Valuables secured Linens and cotton and cotton/polyester blend (less than 51% polyester) Personal oil-based products / skin lotions / body lotions removed Wigs or hairpieces removed Smoking or tobacco materials removed NA Books / newspapers / magazines / loose paper removed Cologne, aftershave, perfume and deodorant removed Jewelry removed (may wrap wedding band) Make-up removed Hair care products removed NA Battery operated devices (external) removed NA Heating patches and chemical warmers removed NA Titanium eyewear removed NA Nail polish cured greater than 10 hours NA Casting material cured greater than 10 hours NA Hearing aids removed NA Loose dentures or partials removed NA Prosthetics have been removed NA Patient demonstrates correct use of air break device (if applicable) Patient concerns have been addressed Patient grounding bracelet on and cord attached to chamber Specifics for Inpatients (complete in addition to above) Medication sheet sent with patient Intravenous medications needed or due during therapy sent with patient Drainage tubes (e.g.  nasogastric tube or chest tube secured and vented) Endotracheal or Tracheotomy tube secured Cuff deflated of air and inflated with saline Airway suctioned Electronic Signature(s) Signed: 08/22/2019 5:14:43 PM By: [name] Entered By: [name] on 08/21/2019 14:59:05

## 2019-08-22 NOTE — Progress Notes (Signed)
KHARIZMA, LESNICK ([id number]) Visit Report for 08/22/2019 Arrival Information Details Patient Name: Date of Service: [place], [name] 08/22/2019 3:00 PM Medical Record Number: [id number] Patient Account Number: [id number] Date of Birth/Sex: Treating RN: 1929-02-16 (86 y.o. [name] Primary Care Jalil Lorusso: PA [name], NO Other Clinician: [name] Referring Malin Cervini: Treating Mykael Batz/Extender: [name] in Treatment: 12 Visit Information History Since Last Visit Added or deleted any medications: No Patient Arrived: Walker Any new allergies or adverse reactions: No Arrival Time: 14:54 Had a fall or experienced change in No Accompanied By: self activities of daily living that may affect Transfer Assistance: None risk of falls: Patient Identification Verified: Yes Signs or symptoms of abuse/neglect since last visito No Secondary Verification Process Completed: Yes Hospitalized since last visit: No Patient Requires Transmission-Based Precautions: No Implantable device outside of the clinic excluding No Patient Has Alerts: No cellular tissue based products placed in the center since last visit: Pain Present Now: No Electronic Signature(s) Signed: 08/22/2019 5:14:43 PM By: [name] Entered By: [name] on 08/22/2019 15:28:12 -------------------------------------------------------------------------------- Encounter Discharge Information Details Patient Name: Date of Service: [name], [name] 08/22/2019 3:00 PM Medical Record Number: [id number] Patient Account Number: [id number] Date of Birth/Sex: Treating RN: August 06, 1929 (86 y.o. [name] Primary Care Jamisyn Langer: PA [name], [name] Other Clinician: [name] Referring Noraa Pickeral: Treating Takeysha Bonk/Extender: [name] in Treatment: 12 Encounter Discharge Information Items Discharge Condition: Stable Ambulatory Status: Walker Discharge Destination: Home Transportation:  Private Auto Accompanied By: self Schedule Follow-up Appointment: Yes Clinical Summary of Care: Patient Declined Electronic Signature(s) Signed: 08/22/2019 5:14:43 PM By: [name] Entered By: [name] on 08/22/2019 17:01:12 -------------------------------------------------------------------------------- Patient/Caregiver Education Details Patient Name: Date of Service: [name], [name] 8/6/2021andnbsp3:00 PM Medical Record Number: [id number] Patient Account Number: [id number] Date of Birth/Gender: Treating RN: Feb 15, 1929 (86 y.o. [name] Primary Care Physician: PA [name], [name] Other Clinician: [name] Referring Physician: Treating Physician/Extender: [name] in Treatment: 12 Education Assessment Education Provided To: Patient Education Topics Provided Hyperbaric Oxygenation: Methods: Explain/Verbal Responses: State content correctly Electronic Signature(s) Signed: 08/22/2019 5:14:43 PM By: [name] Entered By: [name] on 08/22/2019 17:00:52 -------------------------------------------------------------------------------- Vitals Details Patient Name: Date of Service: [name], [name] 08/22/2019 3:00 PM Medical Record Number: [id number] Patient Account Number: [id number] Date of Birth/Sex: Treating RN: 1929-08-31 (86 y.o. [name] Primary Care Byrne Capek: PA [name], [name] Other Clinician: [name] Referring Agness Sibrian: Treating Shereese Bonnie/Extender: [name] in Treatment: 12 Vital Signs Time Taken: 14:58 Temperature (F): 98.2 Height (in): 65 Pulse (bpm): 70 Weight (lbs): 138 Respiratory Rate (breaths/min): 16 Body Mass Index (BMI): 23 Blood Pressure (mmHg): 121/98 Reference Range: 80 - 120 mg / dl Electronic Signature(s) Signed: 08/22/2019 5:14:43 PM By: [name] Entered By: [name] on 08/22/2019 15:28:30

## 2019-08-22 NOTE — Progress Notes (Signed)
[name] ([id number]) Visit Report for 08/21/2019 Arrival Information Details Patient Name: Date of Service: [place], [name] 08/21/2019 3:00 PM Medical Record Number: [id number] Patient Account Number: [id number] Date of Birth/Sex: Treating RN: May 23, 1929 (86 y.o. [name] Primary Care Jamaar Howes: Sara [name], NO Other Clinician: [name] Referring Senora Lacson: Treating Keily Lepp/Extender: [name] in Treatment: 12 Visit Information History Since Last Visit Added or deleted any medications: No Patient Arrived: Walker Any new allergies or adverse reactions: No Arrival Time: 14:35 Had a fall or experienced change in No Accompanied By: self activities of daily living that may affect Transfer Assistance: None risk of falls: Patient Identification Verified: Yes Signs or symptoms of abuse/neglect since last visito No Secondary Verification Process Completed: Yes Hospitalized since last visit: No Patient Requires Transmission-Based Precautions: No Implantable device outside of the clinic excluding No Patient Has Alerts: No cellular tissue based products placed in the center since last visit: Pain Present Now: No Electronic Signature(s) Signed: 08/22/2019 5:14:43 PM By: [name] Entered By: [name] on 08/21/2019 14:57:23 -------------------------------------------------------------------------------- Encounter Discharge Information Details Patient Name: Date of Service: [name], [name] A. 08/21/2019 3:00 PM Medical Record Number: [id number] Patient Account Number: [id number] Date of Birth/Sex: Treating RN: 05-06-1929 (86 y.o. [name] Primary Care Kessler Solly: Sara [name], NO Other Clinician: Mikeal Hawthorne Referring Ramonica Grigg: Treating Evelyna Folker/Extender: [name] in Treatment: 12 Encounter Discharge Information Items Discharge Condition: Stable Ambulatory Status: Walker Discharge Destination: Home Transportation:  Private Auto Accompanied By: self Schedule Follow-up Appointment: Yes Clinical Summary of Care: Patient Declined Electronic Signature(s) Signed: 08/21/2019 5:23:33 PM By: Mikeal Hawthorne EMT/HBOT/SD Entered By: Mikeal Hawthorne on 08/21/2019 17:21:50 -------------------------------------------------------------------------------- Patient/Caregiver Education Details Patient Name: Date of Service: [name], [name] Loni Muse 8/5/2021andnbsp3:00 PM Medical Record Number: [id number] Patient Account Number: [id number] Date of Birth/Gender: Treating RN: 07/27/1929 (86 y.o. [name] Primary Care Physician: Sara [name], [name] Other Clinician: Mikeal Hawthorne Referring Physician: Treating Physician/Extender: [name] in Treatment: 12 Education Assessment Education Provided To: Patient Education Topics Provided Hyperbaric Oxygenation: Methods: Explain/Verbal Responses: State content correctly Electronic Signature(s) Signed: 08/21/2019 5:23:33 PM By: Mikeal Hawthorne EMT/HBOT/SD Entered By: Mikeal Hawthorne on 08/21/2019 17:21:35 -------------------------------------------------------------------------------- Vitals Details Patient Name: Date of Service: [name], [name] A. 08/21/2019 3:00 PM Medical Record Number: [id number] Patient Account Number: [id number] Date of Birth/Sex: Treating RN: May 07, 1929 (86 y.o. [name] Primary Care Gunhild Bautch: Sara [name], [place] Other Clinician: [name] Referring Senan Urey: Treating Kajal Scalici/Extender: [name] in Treatment: 12 Vital Signs Time Taken: 14:37 Temperature (F): 98.0 Height (in): 65 Pulse (bpm): 70 Weight (lbs): 138 Respiratory Rate (breaths/min): 14 Body Mass Index (BMI): 23 Blood Pressure (mmHg): 120/65 Reference Range: 80 - 120 mg / dl Electronic Signature(s) Signed: 08/22/2019 5:14:43 PM By: [name] Entered By: [name] on 08/21/2019 14:57:42

## 2019-08-22 NOTE — Progress Notes (Signed)
ARIAN, MCQUITTY ([id number]) Visit Report for 08/20/2019 SuperBill Details Patient Name: Date of Service: [place], [name] 08/20/2019 Medical Record Number: [id number] Patient Account Number: [id number] Date of Birth/Sex: Treating RN: January 25, 1929 (86 y.o. 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

## 2019-08-22 NOTE — Progress Notes (Signed)
[name], [name] ([id number]) Visit Report for 08/20/2019 Arrival Information Details Patient Name: Date of Service: [name], [name] 08/20/2019 3:00 PM Medical Record Number: [id number] Patient Account Number: [id number] Date of Birth/Sex: Treating RN: May 26, 1929 (86 y.o. [name], [name] Primary Care [name]: PA [name], [name] Other Clinician: [name] Referring [name]: Treating [name]/Extender: [name] in Treatment: 12 Visit Information History Since Last Visit Added or deleted any medications: No Patient Arrived: Walker Any new allergies or adverse reactions: No Arrival Time: 14:51 Had a fall or experienced change in No Accompanied By: SELF activities of daily living that may affect Transfer Assistance: None risk of falls: Patient Identification Verified: Yes Signs or symptoms of abuse/neglect since last visito No Secondary Verification Process Completed: Yes Hospitalized since last visit: No Patient Requires Transmission-Based Precautions: No Implantable device outside of the clinic excluding No Patient Has Alerts: No cellular tissue based products placed in the center since last visit: Pain Present Now: No Electronic Signature(s) Signed: 08/22/2019 5:14:43 PM By: [name] Entered By: [name] on 08/20/2019 16:49:52 -------------------------------------------------------------------------------- Encounter Discharge Information Details Patient Name: Date of Service: Sara RER, [name] 08/20/2019 3:00 PM Medical Record Number: [id number] Patient Account Number: [id number] Date of Birth/Sex: Treating RN: December 05, 1929 (86 y.o. [name] Primary Care [name]: PA [name], [name] Other Clinician: [name] Referring [name]: Treating Shaterra Sanzone/Extender: [name] in Treatment: 12 Encounter Discharge Information Items Discharge Condition: Stable Ambulatory Status: Walker Discharge Destination: Home Transportation:  Private Auto Accompanied By: self Schedule Follow-up Appointment: Yes Clinical Summary of Care: Patient Declined Electronic Signature(s) Signed: 08/22/2019 5:14:43 PM By: [name] Entered By: [name] on 08/20/2019 16:50:51 -------------------------------------------------------------------------------- Patient/Caregiver Education Details Patient Name: Date of Service: Sara Benne, [name] 8/4/2021andnbsp3:00 PM Medical Record Number: [id number] Patient Account Number: [id number] Date of Birth/Gender: Treating RN: Jul 15, 1929 (86 y.o. [name] Primary Care Physician: PA [name], [name] Other Clinician: [name] Referring Physician: Treating Physician/Extender: [name] in Treatment: 12 Education Assessment Education Provided To: Patient Education Topics Provided Hyperbaric Oxygenation: Methods: Explain/Verbal Responses: State content correctly Electronic Signature(s) Signed: 08/22/2019 5:14:43 PM By: [name] Entered By: [name] on 08/20/2019 16:50:36 -------------------------------------------------------------------------------- Vitals Details Patient Name: Date of Service: Sara RER, [name] 08/20/2019 3:00 PM Medical Record Number: [id number] Patient Account Number: [id number] Date of Birth/Sex: Treating RN: May 13, 1929 (86 y.o. [name] Primary Care Kamaya Keckler: PA [name], [name] Other Clinician: Referring [name]: Treating [name] in Treatment: 12 Vital Signs Time Taken: 14:55 Temperature (F): 98.2 Height (in): 65 Pulse (bpm): 72 Weight (lbs): 138 Respiratory Rate (breaths/min): 14 Body Mass Index (BMI): 23 Blood Pressure (mmHg): 107/57 Reference Range: 80 - 120 mg / dl Electronic Signature(s) Signed: 08/22/2019 5:14:43 PM By: [name] Entered By: [name] on 08/20/2019 16:50:10

## 2019-08-25 ENCOUNTER — Encounter (HOSPITAL_BASED_OUTPATIENT_CLINIC_OR_DEPARTMENT_OTHER): Payer: Medicare Other | Admitting: Internal Medicine

## 2019-08-25 DIAGNOSIS — L97223 Non-pressure chronic ulcer of left calf with necrosis of muscle: Secondary | ICD-10-CM | POA: Diagnosis not present

## 2019-08-25 NOTE — Progress Notes (Signed)
[name] ([id number]) Visit Report for 08/22/2019 HBO Details Patient Name: Date of Service: [name], [name] 08/22/2019 3:00 PM Medical Record Number: [id number] Patient Account Number: [id number] Date of Birth/Sex: Treating RN: Feb 14, 1929 (86 y.o. 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 HBO Treatment Details Treatment Number: 33 Patient Type: Outpatient Chamber Type: Monoplace Chamber Serial #: [id number] Treatment Protocol: 2.0 ATA with 90 minutes oxygen, and no air breaks Treatment Details Compression Rate Down: 2.0 psi / minute De-Compression Rate Up: 3.0 psi / minute Air breaks and breathing Decompress Decompress Compress Tx Pressure Begins Reached periods Begins Ends (leave unused spaces blank) Chamber Pressure (ATA 1 2 ------2 1 ) Clock Time (24 hr) 15:05 15:13 - - - - - - 16:49 16:53 Treatment Length: 108 (minutes) Treatment Segments: 4 Vital Signs Capillary Blood Glucose Reference Range: 80 - 120 mg / dl HBO Diabetic Blood Glucose Intervention Range: <131 mg/dl or >249 mg/dl Time Vitals Blood Respiratory Capillary Blood Glucose Pulse Action Type: Pulse: Temperature: Taken: Pressure: Rate: Glucose (mg/dl): Meter #: Oximetry (%) Taken: Pre 14:58 121/98 70 16 98.2 Post 16:55 144/66 49 14 98 Treatment Response Treatment Toleration: Well Treatment Completion Status: Treatment Completed without Adverse Event Electronic Signature(s) Signed: 08/22/2019 5:14:43 PM By: [name] Signed: 08/25/2019 2:22:24 PM By: [name] Sara-C Entered By: [name] on 08/22/2019  16:51:47 -------------------------------------------------------------------------------- HBO Safety Checklist Details Patient Name: Date of Service: [name], Sara Sara A. 08/22/2019 3:00 PM Medical Record Number: [id number] Patient Account Number: [id number] Date of Birth/Sex: Treating RN: 05-01-1929 (86 y.o. [name] Primary Care [name]: [name], [place] Other Clinician: Minerva [name] Referring [name]: Treating [name]/Extender: [name] in Treatment: 12 HBO Safety Checklist Items Safety Checklist Consent Form Signed Patient voided / foley secured and emptied When did you last eato N/A Last dose of injectable or oral agent Ostomy pouch emptied and vented if applicable NA All implantable devices assessed, documented and approved NA Intravenous access site secured and place NA Valuables secured Linens and cotton and cotton/polyester blend (less than 51% polyester) Personal oil-based products / skin lotions / body lotions removed Wigs or hairpieces removed Smoking or tobacco materials removed NA Books / newspapers / magazines / loose paper removed Cologne, aftershave, perfume and deodorant removed Jewelry removed (may wrap wedding band) Make-up removed Hair care products removed Battery operated devices (external) removed NA Heating patches and chemical warmers removed NA Titanium eyewear removed NA Nail polish cured greater than 10 hours NA Casting material cured greater than 10 hours NA Hearing aids removed NA Loose dentures or partials removed NA Prosthetics have been removed NA Patient demonstrates correct use of air break device (if applicable) Patient concerns have been addressed Patient grounding bracelet on and cord attached to chamber Specifics for Inpatients (complete in addition to above) Medication sheet sent with patient Intravenous medications needed or due during therapy sent with patient Drainage tubes (e.g. nasogastric  tube or chest tube secured and vented) Endotracheal or Tracheotomy tube secured Cuff deflated of air and inflated with saline Airway suctioned Electronic Signature(s) Signed: 08/22/2019 5:14:43 PM By: [name] Entered By: [name] on 08/22/2019 15:29:15

## 2019-08-25 NOTE — Progress Notes (Signed)
SABEEN, PIECHOCKI ([id number]) Visit Report for 08/22/2019 SuperBill Details Patient Name: Date of Service: [place], [name] 08/22/2019 Medical Record Number: [id number] Patient Account Number: [id number] Date of Birth/Sex: Treating RN: 02-11-1929 (86 y.o. 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

## 2019-08-26 ENCOUNTER — Encounter (HOSPITAL_BASED_OUTPATIENT_CLINIC_OR_DEPARTMENT_OTHER): Payer: Medicare Other | Admitting: Internal Medicine

## 2019-08-26 DIAGNOSIS — L97223 Non-pressure chronic ulcer of left calf with necrosis of muscle: Secondary | ICD-10-CM | POA: Diagnosis not present

## 2019-08-26 NOTE — Progress Notes (Signed)
[name], [name] ([id number]) Visit Report for 08/25/2019 SuperBill Details Patient Name: Date of Service: [place], [name] 08/25/2019 Medical Record Number: [id number] Patient Account Number: [id number] Date of Birth/Sex: Treating RN: 1929-07-11 (86 y.o. 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, unspecified C83.30 Diffuse large B-cell lymphoma, unspecified site Facility Procedures CPT4 Code Description Modifier Quantity [id number] G0277-(Facility Use Only) HBOT full body chamber, 26min , 4 Physician Procedures Quantity CPT4 Code Description Modifier [id number] 12458 - WC PHYS HYPERBARIC OXYGEN THERAPY 1 ICD-10 Diagnosis Description 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

## 2019-08-27 ENCOUNTER — Encounter (HOSPITAL_BASED_OUTPATIENT_CLINIC_OR_DEPARTMENT_OTHER): Payer: Medicare Other | Admitting: Physician Assistant

## 2019-08-27 DIAGNOSIS — L97223 Non-pressure chronic ulcer of left calf with necrosis of muscle: Secondary | ICD-10-CM | POA: Diagnosis not present

## 2019-08-28 ENCOUNTER — Encounter (HOSPITAL_BASED_OUTPATIENT_CLINIC_OR_DEPARTMENT_OTHER): Payer: Medicare Other | Admitting: Internal Medicine

## 2019-08-28 DIAGNOSIS — L97223 Non-pressure chronic ulcer of left calf with necrosis of muscle: Secondary | ICD-10-CM | POA: Diagnosis not present

## 2019-08-28 NOTE — Progress Notes (Signed)
[name], [name] ([id number]) Visit Report for 08/27/2019 SuperBill Details Patient Name: Date of Service: [place], [name] 08/27/2019 Medical Record Number: [id number] Patient Account Number: [id number] Date of Birth/Sex: Treating RN: June 23, 1929 (86 y.o. 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, unspecified C83.30 Diffuse large B-cell lymphoma, unspecified site Facility Procedures CPT4 Code Description Modifier Quantity [id number] G0277-(Facility Use Only) HBOT full body chamber, 57min , 4 Physician Procedures Quantity CPT4 Code Description Modifier [id number] 61518 - WC PHYS HYPERBARIC OXYGEN THERAPY 1 ICD-10 Diagnosis Description 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

## 2019-08-28 NOTE — Progress Notes (Signed)
SHAQUINA, GILLHAM ([id number]) Visit Report for 08/28/2019 SuperBill Details Patient Name: Date of Service: [place], [name] 08/28/2019 Medical Record Number: [id number] Patient Account Number: [id number] Date of Birth/Sex: Treating RN: Oct 01, 1929 (86 y.o. 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, unspecified C83.30 Diffuse large B-cell lymphoma, unspecified site Facility Procedures CPT4 Code Description Modifier Quantity [id number] G0277-(Facility Use Only) HBOT full body chamber, 50min , 4 Physician Procedures Quantity CPT4 Code Description Modifier 9449675 91638 - WC PHYS HYPERBARIC OXYGEN THERAPY 1 ICD-10 Diagnosis Description 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

## 2019-08-28 NOTE — Progress Notes (Signed)
SAMAYA, BOARDLEY ([id number]) Visit Report for 08/26/2019 SuperBill Details Patient Name: Date of Service: [place], [name] 08/26/2019 Medical Record Number: [id number] Patient Account Number: [id number] Date of Birth/Sex: Treating RN: May 23, 1929 (86 y.o. 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, unspecified C83.30 Diffuse large B-cell lymphoma, unspecified site Facility Procedures CPT4 Code Description Modifier Quantity [id number] G0277-(Facility Use Only) HBOT full body chamber, 81min , 4 Physician Procedures Quantity CPT4 Code Description Modifier [id number] 28638 - WC PHYS HYPERBARIC OXYGEN THERAPY 1 ICD-10 Diagnosis Description 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

## 2019-08-29 ENCOUNTER — Other Ambulatory Visit: Payer: Self-pay

## 2019-08-29 ENCOUNTER — Encounter (HOSPITAL_BASED_OUTPATIENT_CLINIC_OR_DEPARTMENT_OTHER): Payer: Medicare Other | Admitting: Internal Medicine

## 2019-08-29 DIAGNOSIS — L97223 Non-pressure chronic ulcer of left calf with necrosis of muscle: Secondary | ICD-10-CM | POA: Diagnosis not present

## 2019-08-29 NOTE — Progress Notes (Signed)
BRIANI, MAUL ([id number]) Visit Report for 08/29/2019 SuperBill Details Patient Name: Date of Service: [place], [name] 08/29/2019 Medical Record Number: [id number] Patient Account Number: [id number] Date of Birth/Sex: Treating RN: November 22, 1929 (86 y.o. 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, unspecified C83.30 Diffuse large B-cell lymphoma, unspecified site Facility Procedures CPT4 Code Description Modifier Quantity 66063016 G0277-(Facility Use Only) HBOT full body chamber, 36min , 4 Physician Procedures Quantity CPT4 Code Description Modifier [id number] 55732 - WC PHYS HYPERBARIC OXYGEN THERAPY 1 ICD-10 Diagnosis Description 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

## 2019-08-29 NOTE — Progress Notes (Signed)
[name], [name] ([id number]) Visit Report for 08/29/2019 Debridement Details Patient Name: Date of Service: [place], [name] [name]. 08/29/2019 2:15 PM Medical Record Number: [id number] Patient Account Number: [id number] Date of Birth/Sex: Treating RN: 09-09-1929 (86 y.o. [name] Primary Care Provider: PA TIENT, NO Other Clinician: Referring Provider: Treating Provider/Extender: Beverly Gust in Treatment: 13 Debridement Performed for Assessment: Wound #1 Left,Lateral Lower Leg Performed By: Physician Tobi Bastos, MD Debridement Type: Debridement Level of Consciousness (Pre-procedure): Awake and Alert Pre-procedure Verification/Time Out Yes - 16:43 Taken: Start Time: 16:43 Pain Control: Lidocaine 5% topical ointment T Area Debrided (L x W): otal 2 (cm) x 3 (cm) = 6 (cm) Tissue and other material debrided: Viable, Non-Viable, Slough, Subcutaneous, Slough Level: Skin/Subcutaneous Tissue Debridement Description: Excisional Instrument: Curette Bleeding: Minimum Hemostasis Achieved: Pressure End Time: 16:44 Procedural Pain: 2 Post Procedural Pain: 0 Response to Treatment: Procedure was tolerated well Level of Consciousness (Post- Awake and Alert procedure): Post Debridement Measurements of Total Wound Length: (cm) 9.5 Width: (cm) 4 Depth: (cm) 0.4 Volume: (cm) 11.938 Character of Wound/Ulcer Post Debridement: Requires Further Debridement Post Procedure Diagnosis Same as Pre-procedure Electronic Signature(s) Signed: 08/29/2019 4:53:28 PM By: Kela Millin Signed: 08/29/2019 4:53:36 PM By: Tobi Bastos MD, MBA Entered By: Kela Millin on 08/29/2019 16:45:29 -------------------------------------------------------------------------------- HPI Details Patient Name: Date of Service: [name], PA [name]. 08/29/2019 2:15 PM Medical Record Number: [id number] Patient Account Number: [id number] Date of Birth/Sex: Treating RN: 28-Feb-1929 (86 y.o.  [name] Primary Care Provider: PA [name], NO Other Clinician: Referring Provider: Treating Provider/Extender: Beverly Gust in Treatment: 13 History of Present Illness HPI Description: ADMISSION 05/27/2019 This is an 86 year old woman who came here for consideration of hyperbaric oxygen. She has [name] very complicated history. She is been treated for diffuse large B-cell lymphoma since 2006. She has had 2 recurrences of this. Her most recent chemotherapy round was Treanda plus polatuzumab which ended I believe in February. She is not currently on chemotherapy. She came to see [place] with [name] large wound on the left lateral lower leg. Apparently this started as [name] lump sometime in October 2020. She received courses of antibiotics which did not help. She then had [name] biopsy and it actually showed lymphoma. The wound never really healed. And she developed [name] progressive ulcerated tumor. She underwent [name] course of radiation in late October into November 2020. This resulted in some improvement in the mass but she has been left with [name] deep punched out wound with exposed tendon. She was seen by Dr. Jaclynn Guarneri at the wound care center in [facility] She considered her for operative debridement and/or consideration of hyperbaric oxygen. She was also seen in April on 05/07/2019 by Dr. Zigmund Daniel at the wound care center in [facility]. Arterial and venous reflux studies were ordered but I am unable to see these results. I think preparations were being made for hyperbaric oxygen therapy but the patient lives in [place] and so she arrives here for consideration of hyperbaric oxygen for soft tissue radiation injury Other factors in this is that she does not tolerate compression because of pain. She has been using [name] for [name] prolonged period of time for perhaps 5 weeks with some improvement in the wound surface. She has not had the wound rebiopsied but she did have [name] PET scan on 03/18/2027 that did show  residual infiltrating soft tissue density involving the left lower calf but without discrete measurable mass and minimal uptake. I think this is felt  to lend credibility to the fact that the primary mechanism here is radiation injury. At the same time there was no new areas of metabolically active lymphoma identified. She therefore has not been on any ongoing chemotherapy. She also had [name] CT scan of the left lower extremity on 05/21/2019 that did not show any fracture or dislocation of the left tibia or fibula large soft tissue ulcer overlying the distal tibia no evidence of bony erosion or sclerosis to suggest osteomyelitis. 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 , The patient has had prior recent arterial studies we did not do an ABI in this clinic today. 5/18; wound looks about the same. Certainly no improvement in condition of the wound bed. She has been using [name] change daily. Deep necrotic wound on the left lateral lower calf 6/1; absolutely no change in this wound. Completely nonviable surface with tendon in the middle. She ran out of [name] last week in my absence and has been using what sounds like Vache solution. I will refax the [name] to [facility] I have her echocardiogram report. Her valve area is 1.25 mild to moderate AS. I think this is slightly worse than 3 years ago but this should not preclude her treatment with hyperbarics. I managed to creep in Dr. Kennon Holter interaction with his own nurse with regards to this question and I think he is approved that. I will text him just to make sure 6/15; some improvement in the wound surface using [name] with daily dressing changes. She is washing this off daily. Patient started hyperbarics today. 6/29; patient seen today in conjunction with HBO. She is doing dive #11 today. 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 She is midway through her course. 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 She has been using [name]. Making excellent improvement. She is tolerating HBO well 7/27; P patient seen in conjunction with HBO. 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 We have been using [name]. Continued improvement. She is tolerating HBO well. 08/29/19-Patient seen in conjunction with HBO, unfortunately this wound has become larger and has an area of necrosis at the inferior part of the wound with slough. Patient also has increased pain to the wound. She is 40 out of 42 HBO treatments at this time. She does have an appointment set up with general surgery to look at this wound. Electronic Signature(s) Signed: 08/29/2019 4:51:07 PM By: Tobi Bastos MD, MBA Entered By: Tobi Bastos on 08/29/2019 16:51:07 -------------------------------------------------------------------------------- Physical Exam Details Patient Name: Date of Service: [name], PA [name]. 08/29/2019 2:15 PM Medical Record Number: [id number] Patient Account Number: [id number] Date of Birth/Sex: Treating RN: January 27, 1929 (86 y.o. [name] Primary Care Provider: PA [name], NO Other Clinician: Referring Provider: Treating Provider/Extender: Beverly Gust in Treatment: 13 Constitutional alert and oriented x 3. sitting or standing blood pressure is within target range for patient.. supine blood pressure is within target range for patient.. pulse regular and within target range for patient.Marland Kitchen respirations regular, non-labored and within target range for patient.Marland Kitchen temperature within target range for patient.. . . Well- nourished and well-hydrated in no acute distress. Notes Left lower extremity ankle wound with slough adherent at the bottom most portion that was debrided with #5 curette, some of  that was removed  but most of it stayed stuck, the rest of the wound appears to have healthy granulation tissue. Electronic Signature(s) Signed: 08/29/2019 4:52:03 PM By: Tobi Bastos MD, MBA Entered By: Tobi Bastos on 08/29/2019 16:52:02 -------------------------------------------------------------------------------- Physician Orders Details Patient Name: Date of Service: [name], PA [name]. 08/29/2019 2:15 PM Medical Record Number: [id number] Patient Account Number: [id number] Date of Birth/Sex: Treating RN: [birth year] (86 y.o. [name] Primary Care Provider: PA TIENT, NO Other Clinician: Referring Provider: Treating Provider/Extender: Beverly Gust in Treatment: 20 Verbal / Phone Orders: No Diagnosis Coding ICD-10 Coding Code Description [phone] Non-pressure chronic ulcer of left calf with necrosis of muscle L59.9 Disorder of the skin and subcutaneous tissue related to radiation, unspecified C83.30 Diffuse large B-cell lymphoma, unspecified site Follow-up Appointments Return Appointment in 1 week. Dressing Change Frequency Wound #1 Left,Lateral Lower Leg Change Dressing every other day. Wound Cleansing Wound #1 Left,Lateral Lower Leg May shower and wash wound with soap and water. - on days that dressing is changed Primary Wound Dressing Wound #1 Left,Lateral Lower Leg [name] Ointment - lightly moistened saline gauze over santyl Secondary Dressing Wound #1 Left,Lateral Lower Leg Kerlix/Rolled Gauze - secure with tape Dry Gauze Hyperbaric Oxygen Therapy Indication: - soft tissue radiation necrosis If appropriate for treatment, begin HBOT per protocol: 2.0 ATA for 90 Minutes without [name] Breaks ir Total Number of Treatments: - 40 treatments One treatments per day (delivered Monday through Friday unless otherwise specified in Special Instructions below): Electronic Signature(s) Signed: 08/29/2019 4:53:28 PM By: Kela Millin Signed: 08/29/2019 4:53:36 PM By:  Tobi Bastos MD, MBA Entered By: Kela Millin on 08/29/2019 16:49:11 -------------------------------------------------------------------------------- Problem List Details Patient Name: Date of Service: [name], [place] [name]. 08/29/2019 2:15 PM Medical Record Number: [id number] Patient Account Number: [id number] Date of Birth/Sex: Treating RN: [birth year] (86 y.o. [name] Primary Care Provider: PA [name], [name] Other Clinician: Referring Provider: Treating Provider/Extender: Beverly Gust in Treatment: 13 Active Problems ICD-10 Encounter Code Description Active Date MDM Diagnosis L97.223 Non-pressure chronic ulcer of left calf with necrosis of muscle 05/27/2019 No Yes L59.9 Disorder of the skin and subcutaneous tissue related to radiation, unspecified 05/27/2019 No Yes C83.30 Diffuse large B-cell lymphoma, unspecified site 05/27/2019 No Yes Inactive Problems Resolved Problems Electronic Signature(s) Signed: 08/29/2019 4:53:28 PM By: Kela Millin Signed: 08/29/2019 4:53:36 PM By: Tobi Bastos MD, MBA Entered By: Kela Millin on 08/29/2019 14:24:54 -------------------------------------------------------------------------------- Progress Note Details Patient Name: Date of Service: [name], PA [name]. 08/29/2019 2:15 PM Medical Record Number: [id number] Patient Account Number: [id number] Date of Birth/Sex: Treating RN: [birth year] (86 y.o. [name] Primary Care Provider: PA [name], NO Other Clinician: Referring Provider: Treating Provider/Extender: Beverly Gust in Treatment: 13 Subjective History of Present Illness (HPI) ADMISSION 05/27/2019 This is an 86 year old woman who came here for consideration of hyperbaric oxygen. She has [name] very complicated history. She is been treated for diffuse large B-cell lymphoma since 2006. She has had 2 recurrences of this. Her most recent chemotherapy round was Treanda plus polatuzumab which  ended I believe in February. She is not currently on chemotherapy. She came to see [place] with [name] large wound on the left lateral lower leg. Apparently this started as [name] lump sometime in October 2020. She received courses of antibiotics which did not help. She then had [name] biopsy and it actually showed lymphoma. The wound never really healed. And she developed [name] progressive ulcerated tumor. She underwent [name] course of radiation in late  October into November 2020. This resulted in some improvement in the mass but she has been left with [name] deep punched out wound with exposed tendon. She was seen by Dr. Jaclynn Guarneri at the wound care center in [facility]. She considered her for operative debridement and/or consideration of hyperbaric oxygen. She was also seen in April on 05/07/2019 by Dr. Zigmund Daniel at the wound care center in [facility]. Arterial and venous reflux studies were ordered but I am unable to see these results. I think preparations were being made for hyperbaric oxygen therapy but the patient lives in [place] and so she arrives here for consideration of hyperbaric oxygen for soft tissue radiation injury Other factors in this is that she does not tolerate compression because of pain. She has been using [name] for [name] prolonged period of time for perhaps 5 weeks with some improvement in the wound surface. She has not had the wound rebiopsied but she did have [name] PET scan on 03/18/2027 that did show residual infiltrating soft tissue density involving the left lower calf but without discrete measurable mass and minimal uptake. 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 At the same time there was no new areas of metabolically active lymphoma identified. She therefore has not been on any ongoing chemotherapy. She also had [name] CT scan of the left lower extremity on 05/21/2019 that did not show any fracture or dislocation of the left tibia or fibula large soft  tissue ulcer overlying the distal tibia no evidence of bony erosion or sclerosis to suggest osteomyelitis. 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 , The patient has had prior recent arterial studies we did not do an ABI in this clinic today. 5/18; wound looks about the same. Certainly no improvement in condition of the wound bed. She has been using [name] change daily. Deep necrotic wound on the left lateral lower calf 6/1; absolutely no change in this wound. Completely nonviable surface with tendon in the middle. She ran out of [name] last week in my absence and has been using what sounds like Vache solution. I will refax the [name] to [facility] I have her echocardiogram report. Her valve area is 1.25 mild to moderate AS. I think this is slightly worse than 3 years ago but this should not preclude her treatment with hyperbarics. I managed to creep in Dr. Kennon Holter interaction with his own nurse with regards to this question and I think he is approved that. I will text him just to make sure 6/15; some improvement in the wound surface using [name] with daily dressing changes. She is washing this off daily. Patient started hyperbarics today. 6/29; patient seen today in conjunction with HBO. She is doing dive #11 today. 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 She is midway through her course. 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 She has been using [name]. Making excellent improvement. She is tolerating HBO well 7/27; P patient seen in conjunction with HBO. 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 We have been using [name]. Continued improvement. She is tolerating HBO well. 08/29/19-Patient seen in conjunction with HBO, unfortunately this wound has  become larger and has an area of necrosis at  the inferior part of the wound with slough. Patient also has increased pain to the wound. She is 40 out of 42 HBO treatments at this time. She does have an appointment set up with general surgery to look at this wound. Objective Constitutional alert and oriented x 3. sitting or standing blood pressure is within target range for patient.. supine blood pressure is within target range for patient.. pulse regular and within target range for patient.Marland Kitchen respirations regular, non-labored and within target range for patient.Marland Kitchen temperature within target range for patient.. Well- nourished and well-hydrated in no acute distress. Vitals Time Taken: 2:35 PM, Height: 65 in, Weight: 138 lbs, BMI: 23, Temperature: 97.6 F, Pulse: 68 bpm, Respiratory Rate: 16 breaths/min, Blood Pressure: 117/76 mmHg. General Notes: Left lower extremity ankle wound with slough adherent at the bottom most portion that was debrided with #5 curette, some of that was removed but most of it stayed stuck, the rest of the wound appears to have healthy granulation tissue. Integumentary (Hair, Skin) Wound #1 status is Open. Original cause of wound was Bump. The wound is located on the Left,Lateral Lower Leg. The wound measures 9.5cm length x 4cm width x 0.4cm depth; 29.845cm^2 area and 11.938cm^3 volume. There is muscle, tendon, and Fat Layer (Subcutaneous Tissue) Exposed exposed. There is no tunneling or undermining noted. There is [name] medium amount of serosanguineous drainage noted. The wound margin is distinct with the outline attached to the wound base. There is small (1-33%) pink granulation within the wound bed. There is [name] large (67-100%) amount of necrotic tissue within the wound bed including Adherent Slough. Assessment Active Problems ICD-10 Non-pressure chronic ulcer of left calf with necrosis of muscle Disorder of the skin and subcutaneous tissue related to radiation,  unspecified Diffuse large B-cell lymphoma, unspecified site Procedures Wound #1 Pre-procedure diagnosis of Wound #1 is [name] Soft Tissue Radionecrosis located on the Left,Lateral Lower Leg . There was [name] Excisional Skin/Subcutaneous Tissue Debridement with [name] total area of 6 sq cm performed by Tobi Bastos, MD. With the following instrument(s): Curette to remove Viable and Non-Viable tissue/material. Material removed includes Subcutaneous Tissue and Slough and after achieving pain control using Lidocaine 5% topical ointment. No specimens were taken. [name] time out was conducted at 16:43, prior to the start of the procedure. [name] Minimum amount of bleeding was controlled with Pressure. The procedure was tolerated well with [name] pain level of 2 throughout and [name] pain level of 0 following the procedure. Post Debridement Measurements: 9.5cm length x 4cm width x 0.4cm depth; 11.938cm^3 volume. Character of Wound/Ulcer Post Debridement requires further debridement. Post procedure Diagnosis Wound #1: Same as Pre-Procedure Plan Follow-up Appointments: Return Appointment in 1 week. Dressing Change Frequency: Wound #1 Left,Lateral Lower Leg: Change Dressing every other day. Wound Cleansing: Wound #1 Left,Lateral Lower Leg: May shower and wash wound with soap and water. - on days that dressing is changed Primary Wound Dressing: Wound #1 Left,Lateral Lower Leg: [name] Ointment - lightly moistened saline gauze over santyl Secondary Dressing: Wound #1 Left,Lateral Lower Leg: Kerlix/Rolled Gauze - secure with tape Dry Gauze Hyperbaric Oxygen Therapy: Indication: - soft tissue radiation necrosis If appropriate for treatment, begin HBOT per protocol: 2.0 ATA for 90 Minutes without Air Breaks T Number of Treatments: - 40 treatments otal One treatments per day (delivered Monday through Friday unless otherwise specified in Special Instructions below): -Left lateral lower leg wound will be addressed with [name],  wet-to-dry, daily changes, -Return to clinic next week for wound check -HBO treatments  to continue to completion Electronic Signature(s) Signed: 08/29/2019 4:52:52 PM By: Tobi Bastos MD, MBA Entered By: Tobi Bastos on 08/29/2019 16:52:52 -------------------------------------------------------------------------------- SuperBill Details Patient Name: Date of Service: [name], PA [name]. 08/29/2019 Medical Record Number: [id number] Patient Account Number: [id number] Date of Birth/Sex: Treating RN: [birth year] (86 y.o. 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

## 2019-09-01 ENCOUNTER — Encounter (HOSPITAL_BASED_OUTPATIENT_CLINIC_OR_DEPARTMENT_OTHER): Payer: Medicare Other | Admitting: Internal Medicine

## 2019-09-01 DIAGNOSIS — L97223 Non-pressure chronic ulcer of left calf with necrosis of muscle: Secondary | ICD-10-CM | POA: Diagnosis not present

## 2019-09-01 NOTE — Progress Notes (Signed)
[name] [name] ([id number]) Visit Report for 09/01/2019 Arrival Information Details Patient Name: Date of Service: [place], [name] 09/01/2019 3:00 PM Medical Record Number: [id number] Patient Account Number: [id number] Date of Birth/Sex: Treating RN: Jul 22, 1929 (86 y.o. [name] Primary Care [name] [name]: [name] [name], NO Other Clinician: Mikeal Hawthorne Referring Chais Fehringer: Treating Melbourne Jakubiak/Extender: [name] in Treatment: 13 Visit Information History Since Last Visit Added or deleted any medications: No Patient Arrived: Walker Any new allergies or adverse reactions: No Arrival Time: 14:55 Had a fall or experienced change in No Accompanied By: self activities of daily living that may affect Transfer Assistance: None risk of falls: Patient Identification Verified: Yes Signs or symptoms of abuse/neglect since last visito No Secondary Verification Process Completed: Yes Hospitalized since last visit: No Patient Requires Transmission-Based Precautions: No Implantable device outside of the clinic excluding No Patient Has Alerts: No cellular tissue based products placed in the center since last visit: Pain Present Now: No Electronic Signature(s) Signed: 09/01/2019 5:23:17 PM By: Mikeal Hawthorne EMT/HBOT/SD Entered By: Mikeal Hawthorne on 09/01/2019 15:24:21 -------------------------------------------------------------------------------- Encounter Discharge Information Details Patient Name: Date of Service: [name] [name], [name] [name] A. 09/01/2019 3:00 PM Medical Record Number: [id number] Patient Account Number: [id number] Date of Birth/Sex: Treating RN: Nov 03, 1929 (86 y.o. [name] Primary Care [name] [name]: [name] [name], NO Other Clinician: Mikeal Hawthorne Referring Oanh Devivo: Treating Satoya Feeley/Extender: [name] in Treatment: 13 Encounter Discharge Information Items Discharge Condition: Stable Ambulatory Status: Walker Discharge Destination:  Home Transportation: Private Auto Accompanied By: self Schedule Follow-up Appointment: Yes Clinical Summary of Care: Patient Declined Electronic Signature(s) Signed: 09/01/2019 5:23:17 PM By: Mikeal Hawthorne EMT/HBOT/SD Entered By: Mikeal Hawthorne on 09/01/2019 17:11:04 -------------------------------------------------------------------------------- Patient/Caregiver Education Details Patient Name: Date of Service: [name] [name] [name] [name] 8/16/2021andnbsp3:00 PM Medical Record Number: [id number] Patient Account Number: [id number] Date of Birth/Gender: Treating RN: Nov 09, 1929 (86 y.o. [name] Primary Care Physician: [name] [name], [name] Other Clinician: Mikeal Hawthorne Referring Physician: Treating Physician/Extender: [name] in Treatment: 13 Education Assessment Education Provided To: Patient Education Topics Provided Hyperbaric Oxygenation: Methods: Explain/Verbal Responses: State content correctly Electronic Signature(s) Signed: 09/01/2019 5:23:17 PM By: Mikeal Hawthorne EMT/HBOT/SD Entered By: Mikeal Hawthorne on 09/01/2019 17:10:50 -------------------------------------------------------------------------------- Vitals Details Patient Name: Date of Service: [name] [name], [name] [name] A. 09/01/2019 3:00 PM Medical Record Number: [id number] Patient Account Number: [id number] Date of Birth/Sex: Treating RN: 1929/01/25 (86 y.o. [name] Primary Care Ilian Wessell: [name] [name], NO Other Clinician: Mikeal Hawthorne Referring Kealan Buchan: Treating [name] [name]/Extender: [name] in Treatment: 13 Vital Signs Time Taken: 15:00 Temperature (F): 98 Height (in): 65 Pulse (bpm): 68 Weight (lbs): 138 Respiratory Rate (breaths/min): 16 Body Mass Index (BMI): 23 Blood Pressure (mmHg): 123/61 Reference Range: 80 - 120 mg / dl Electronic Signature(s) Signed: 09/01/2019 5:23:17 PM By: Mikeal Hawthorne EMT/HBOT/SD Entered By: Mikeal Hawthorne on 09/01/2019 15:24:36

## 2019-09-01 NOTE — Progress Notes (Addendum)
[name] ([id number]) Visit Report for 09/01/2019 HBO Details Patient Name: Date of Service: [name], [name] 09/01/2019 3:00 PM Medical Record Number: [id number] Patient Account Number: [id number] Date of Birth/Sex: Treating RN: 07-27-1929 (86 y.o. 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 HBO Treatment Details Treatment Number: 39 Patient Type: Outpatient Chamber Type: Monoplace Chamber Serial #: [id number] Treatment Protocol: 2.0 ATA with 90 minutes oxygen, and no air breaks Treatment Details Compression Rate Down: 2.0 psi / minute De-Compression Rate Up: 2.0 psi / minute Air breaks and breathing Decompress Decompress Compress Tx Pressure Begins Reached periods Begins Ends (leave unused spaces blank) Chamber Pressure (ATA 1 2 ------2 1 ) Clock Time (24 hr) 15:06 15:14 - - - - - - 16:44 16:52 Treatment Length: 106 (minutes) Treatment Segments: 4 Vital Signs Capillary Blood Glucose Reference Range: 80 - 120 mg / dl HBO Diabetic Blood Glucose Intervention Range: <131 mg/dl or >249 mg/dl Time Vitals Blood Respiratory Capillary Blood Glucose Pulse Action Type: Pulse: Temperature: Taken: Pressure: Rate: Glucose (mg/dl): Meter #: Oximetry (%) Taken: Pre 15:00 123/61 68 16 98 Post 16:55 142/62 55 14 98.3 Treatment Response Treatment Toleration: Well Treatment Completion Status: Treatment Completed without Adverse Event Rayhan Groleau Notes No concerns with treatment given Physician HBO Attestation: I certify that I supervised this HBO treatment in accordance with Medicare guidelines. A trained emergency  response team is readily available per Yes hospital policies and procedures. Continue HBOT as ordered. Yes Electronic Signature(s) Signed: 09/01/2019 5:53:04 PM By: Linton Ham MD Previous Signature: 09/01/2019 5:23:17 PM Version By: Mikeal Hawthorne EMT/HBOT/SD Entered By: Linton Ham on 09/01/2019 17:50:19 -------------------------------------------------------------------------------- HBO Safety Checklist Details Patient Name: Date of Service: [name], [name] 09/01/2019 3:00 PM Medical Record Number: [id number] Patient Account Number: [id number] Date of Birth/Sex: Treating RN: 10-20-1929 (86 y.o. [name] Primary Care Lylia Karn: PA [name], NO Other Clinician: Mikeal Hawthorne Referring [name]: Treating Hawthorne Day/Extender: [name] in Treatment: 13 HBO Safety Checklist Items Safety Checklist Consent Form Signed Patient voided / foley secured and emptied When did you last eato n/a Last dose of injectable or oral agent n/a Ostomy pouch emptied and vented if applicable NA All implantable devices assessed, documented and approved NA Intravenous access site secured and place NA Valuables secured Linens and cotton and cotton/polyester blend (less than 51% polyester) Personal oil-based products / skin lotions / body lotions removed Wigs or hairpieces removed NA Smoking or tobacco materials removed NA Books / newspapers / magazines / loose paper removed Cologne, aftershave, perfume and deodorant removed Jewelry removed (may wrap wedding band) Make-up removed Hair care products removed Battery operated devices (external) removed NA Heating patches and chemical warmers removed NA Titanium eyewear removed NA Nail polish cured greater than 10 hours NA Casting material cured greater than 10 hours NA Hearing aids removed NA Loose dentures or partials removed NA Prosthetics have been removed NA Patient demonstrates correct use of air break  device (if applicable) Patient concerns have been addressed Patient grounding bracelet on and cord attached to chamber Specifics for Inpatients (complete in addition to above) Medication sheet sent with patient Intravenous medications needed or due during therapy sent with patient Drainage tubes (e.g. nasogastric tube or chest tube  secured and vented) Endotracheal or Tracheotomy tube secured Cuff deflated of air and inflated with saline Airway suctioned Electronic Signature(s) Signed: 09/01/2019 3:25:11 PM By: Mikeal Hawthorne EMT/HBOT/SD Entered By: Mikeal Hawthorne on 09/01/2019 15:25:10

## 2019-09-01 NOTE — Progress Notes (Signed)
[name], [name] ([id number]) Visit Report for 09/01/2019 SuperBill Details Patient Name: Date of Service: [place], [name] 09/01/2019 Medical Record Number: [id number] Patient Account Number: [id number] Date of Birth/Sex: Treating RN: [birth year] (86 y.o. 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

## 2019-09-02 ENCOUNTER — Encounter (HOSPITAL_BASED_OUTPATIENT_CLINIC_OR_DEPARTMENT_OTHER): Payer: Medicare Other | Admitting: Internal Medicine

## 2019-09-02 DIAGNOSIS — L97223 Non-pressure chronic ulcer of left calf with necrosis of muscle: Secondary | ICD-10-CM | POA: Diagnosis not present

## 2019-09-02 NOTE — Progress Notes (Signed)
[name], [name] ([id number]) Visit Report for 09/02/2019 SuperBill Details Patient Name: Date of Service: [name], [name] 09/02/2019 Medical Record Number: [id number] Patient Account Number: [id number] Date of Birth/Sex: Treating RN: 12/29/1929 (86 y.o. 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

## 2019-09-02 NOTE — Progress Notes (Signed)
[name], [name] ([id number]) Visit Report for 09/02/2019 Arrival Information Details Patient Name: Date of Service: [name], Sara A. 09/02/2019 3:00 PM Medical Record Number: [id number] Patient Account Number: [id number] Date of Birth/Sex: Treating RN: 1930-01-11 (86 y.o. [name] Primary Care Dezmen Alcock: Sara [name], [name] Other Clinician: Mikeal Hawthorne Referring Berl Bonfanti: Treating Jaterrius Ricketson/Extender: [name] in Treatment: 14 Visit Information History Since Last Visit Added or deleted any medications: No Patient Arrived: [name] Any new allergies or adverse reactions: No Arrival Time: 14:50 Had a fall or experienced change in No Accompanied By: self activities of daily living that may affect Transfer Assistance: None risk of falls: Patient Identification Verified: Yes Signs or symptoms of abuse/neglect since last visito No Secondary Verification Process Completed: Yes Hospitalized since last visit: No Patient Requires Transmission-Based Precautions: No Implantable device outside of the clinic excluding No Patient Has Alerts: No cellular tissue based products placed in the center since last visit: Pain Present Now: No Electronic Signature(s) Signed: 09/02/2019 5:19:12 PM By: Mikeal Hawthorne EMT/HBOT/SD Entered By: Mikeal Hawthorne on 09/02/2019 14:58:47 -------------------------------------------------------------------------------- Encounter Discharge Information Details Patient Name: Date of Service: Sara RER, Sara [name] 09/02/2019 3:00 PM Medical Record Number: [id number] Patient Account Number: [id number] Date of Birth/Sex: Treating RN: [birth year] (86 y.o. [name] Primary Care Dellia Donnelly: Sara [name], [name] Other Clinician: Mikeal Hawthorne Referring Lennell Shanks: Treating Tamer Baughman/Extender: [name] in Treatment: 14 Encounter Discharge Information Items Discharge Condition: Stable Ambulatory Status: Walker Discharge Destination:  Home Transportation: Private Auto Accompanied By: self Schedule Follow-up Appointment: Yes Clinical Summary of Care: Patient Declined Electronic Signature(s) Signed: 09/02/2019 5:19:12 PM By: Mikeal Hawthorne EMT/HBOT/SD Entered By: Mikeal Hawthorne on 09/02/2019 17:18:34 -------------------------------------------------------------------------------- Patient/Caregiver Education Details Patient Name: Date of Service: [name], [name] 8/17/2021andnbsp3:00 PM Medical Record Number: [id number] Patient Account Number: [id number] Date of Birth/Gender: Treating RN: 20-Nov-1929 (86 y.o. [name] Primary Care Physician: Sara [name], [name] Other Clinician: Mikeal Hawthorne Referring Physician: Treating Physician/Extender: [name] in Treatment: 14 Education Assessment Education Provided To: Patient Education Topics Provided Hyperbaric Oxygenation: Methods: Explain/Verbal Responses: State content correctly Electronic Signature(s) Signed: 09/02/2019 5:19:12 PM By: Mikeal Hawthorne EMT/HBOT/SD Entered By: Mikeal Hawthorne on 09/02/2019 17:18:20 -------------------------------------------------------------------------------- Vitals Details Patient Name: Date of Service: Sara RER, Sara [name] 09/02/2019 3:00 PM Medical Record Number: [id number] Patient Account Number: [id number] Date of Birth/Sex: Treating RN: [birth year] (86 y.o. [name] Primary Care Katheren Jimmerson: Sara [name], [name] Other Clinician: Mikeal Hawthorne Referring Toriann Spadoni: Treating Paytyn Mesta/Extender: [name] in Treatment: 14 Vital Signs Time Taken: 14:55 Temperature (F): 97.7 Height (in): 65 Pulse (bpm): 71 Weight (lbs): 138 Respiratory Rate (breaths/min): 16 Body Mass Index (BMI): 23 Blood Pressure (mmHg): 123/66 Reference Range: 80 - 120 mg / dl Electronic Signature(s) Signed: 09/02/2019 5:19:12 PM By: Mikeal Hawthorne EMT/HBOT/SD Entered By: Mikeal Hawthorne on 09/02/2019 14:59:04

## 2019-09-02 NOTE — Progress Notes (Addendum)
[name], [name] ([id number]) Visit Report for 09/02/2019 HBO Details Patient Name: Date of Service: [place], [name] 09/02/2019 3:00 PM Medical Record Number: [id number] Patient Account Number: [id number] Date of Birth/Sex: Treating RN: Jun 29, 1929 (86 y.o. 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 HBO Treatment 09/02/2019 End Date: HBO Discharge Treatment Series Complete; Non-Wound Protocol Outcome: Completed with Symptom Relief HBO Treatment Details Treatment Number: 40 Patient Type: Outpatient Chamber Type: Monoplace Chamber Serial #: [id number] Treatment Protocol: 2.0 ATA with 90 minutes oxygen, and no air breaks Treatment Details Compression Rate Down: 2.0 psi / minute De-Compression Rate Up: 2.0 psi / minute Air breaks and breathing Decompress Decompress Compress Tx Pressure Begins Reached periods Begins Ends (leave unused spaces blank) Chamber Pressure (ATA 1 2 ------2 1 ) Clock Time (24 hr) 15:02 15:10 - - - - - - 16:40 16:48 Treatment Length: 106 (minutes) Treatment Segments: 4 Vital Signs Capillary Blood Glucose Reference Range: 80 - 120 mg / dl HBO Diabetic Blood Glucose Intervention Range: <131 mg/dl or >249 mg/dl Time Vitals Blood Respiratory Capillary Blood Glucose Pulse Action Type: Pulse: Temperature: Taken: Pressure: Rate: Glucose (mg/dl): Meter #: Oximetry (%) Taken: Pre 14:55 123/66 71 16 97.7 Post 16:50 151/86 62 18 97.8 Treatment Response Treatment Toleration: Well Treatment Completion Status: Treatment Completed without Adverse Event Titan Karner Notes no concerns with treatment given,  last dive in this session. She sees plastic surgery tomorrow. Question of plan going forward. Will discuss on friday when she is in for wound care review Physician HBO Attestation: I certify that I supervised this HBO treatment in accordance with Medicare guidelines. A trained emergency response team is readily available per Yes hospital policies and procedures. Continue HBOT as ordered. Yes Electronic Signature(s) Signed: 09/05/2019 3:54:40 PM By: Mikeal Hawthorne EMT/HBOT/SD Signed: 09/08/2019 7:15:26 AM By: Linton Ham MD Signed: 09/08/2019 7:15:26 AM By: Linton Ham MD Previous Signature: 09/02/2019 6:57:29 PM Version By: Linton Ham MD Previous Signature: 09/02/2019 5:19:12 PM Version By: Mikeal Hawthorne EMT/HBOT/SD Entered By: Mikeal Hawthorne on 09/05/2019 15:54:39 -------------------------------------------------------------------------------- HBO Safety Checklist Details Patient Name: Date of Service: [place], [name] 09/02/2019 3:00 PM Medical Record Number: [id number] Patient Account Number: [id number] Date of Birth/Sex: Treating RN: May 13, 1929 (86 y.o. [name] Primary Care Jennene Downie: PA [name], [name] Other Clinician: Mikeal Hawthorne Referring Tarren Velardi: Treating Zadkiel Dragan/Extender: [name] in Treatment: 14 HBO Safety Checklist Items Safety Checklist Consent Form Signed Patient voided / foley secured and emptied When did you last eato n/a Last dose of injectable or oral agent n/a Ostomy pouch emptied and vented if applicable NA All implantable devices assessed, documented and approved NA Intravenous access site secured and place NA Valuables secured Linens and cotton and cotton/polyester blend (less than 51% polyester) Personal oil-based products / skin lotions / body lotions removed Wigs or hairpieces removed NA Smoking or tobacco materials removed NA Books / newspapers / magazines / loose paper removed Cologne, aftershave, perfume and  deodorant removed Jewelry removed (may wrap wedding band) Make-up removed Hair care products removed Battery operated devices (external) removed NA Heating patches and chemical warmers removed NA Titanium eyewear removed NA Nail polish cured greater than 10 hours NA Casting material cured greater than 10 hours NA  Hearing aids removed NA Loose dentures or partials removed NA Prosthetics have been removed NA Patient demonstrates correct use of air break device (if applicable) Patient concerns have been addressed Patient grounding bracelet on and cord attached to chamber Specifics for Inpatients (complete in addition to above) Medication sheet sent with patient Intravenous medications needed or due during therapy sent with patient Drainage tubes (e.g. nasogastric tube or chest tube secured and vented) Endotracheal or Tracheotomy tube secured Cuff deflated of air and inflated with saline Airway suctioned Electronic Signature(s) Signed: 09/02/2019 3:03:57 PM By: Mikeal Hawthorne EMT/HBOT/SD Entered By: Mikeal Hawthorne on 09/02/2019 15:03:56

## 2019-09-03 ENCOUNTER — Ambulatory Visit (INDEPENDENT_AMBULATORY_CARE_PROVIDER_SITE_OTHER): Payer: Medicare Other | Admitting: Plastic Surgery

## 2019-09-03 ENCOUNTER — Other Ambulatory Visit: Payer: Self-pay

## 2019-09-03 ENCOUNTER — Encounter: Payer: Self-pay | Admitting: Plastic Surgery

## 2019-09-03 VITALS — BP 124/79 | HR 70 | Temp 98.4°F | Ht 65.0 in | Wt 133.4 lb

## 2019-09-03 DIAGNOSIS — S81802A Unspecified open wound, left lower leg, initial encounter: Secondary | ICD-10-CM | POA: Diagnosis not present

## 2019-09-03 NOTE — Progress Notes (Signed)
   Referring Provider No referring provider defined for this encounter.   CC:  Chief Complaint  Patient presents with  . Advice Only      [name] is an 86 y.o. female.  HPI: Patient presents with a chronic lle wound.  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  Radiation ended over 1 year ago.  She has done aggressive wound care and hyperbaric oxygen.  Little change or improvement in wound.  Sent for surgical opinion.  Allergies  Allergen Reactions  . Prednisone Other (See Comments)  . Apixaban Itching    Rash, muscle pain, and and weakness also presented  . Hydrocodone-Acetaminophen Nausea And Vomiting    Outpatient Encounter Medications as of 09/03/2019  Medication Sig  . Calcium Carb-Ergocalciferol 500-200 MG-UNIT TABS Take by mouth.  . citalopram (CELEXA) 20 MG tablet TAKE 1 TABLET DAILY  . collagenase ([name]) ointment   . diclofenac Sodium (VOLTAREN) 1 % GEL Apply topically twice a day as needed  . Glucosamine Sulfate 500 MG TABS Take by mouth.  Marland Kitchen ibuprofen (ADVIL) 200 MG tablet Take by mouth.  . lidocaine-prilocaine (EMLA) cream Apply to wound daily  . Magnesium Chloride 64 MG TABS Take by mouth.  . Multiple Vitamin (MULTI-VITAMIN) tablet Take by mouth.  . niacin 500 MG CR capsule Take by mouth.  Marland Kitchen omeprazole (PRILOSEC) 20 MG capsule TAKE 1 CAPSULE DAILY  . ondansetron (ZOFRAN) 8 MG tablet Take by mouth.  . potassium chloride (MICRO-K) 10 MEQ CR capsule Take by mouth.  . Probiotic CAPS Take by mouth.  Marland Kitchen allopurinol (ZYLOPRIM) 100 MG tablet Take 100 mg by mouth daily. (Patient not taking: Reported on 09/03/2019)  . cyanocobalamin 1000 MCG tablet Take by mouth. (Patient not taking: Reported on 09/03/2019)   No facility-administered encounter medications on file as of 09/03/2019.     No past medical history on file.  No past surgical history on file.  No family history on file.  Social History   Social History  Narrative  . Not on file     Review of Systems General: Denies fevers, chills, weight loss CV: Denies chest pain, shortness of breath, palpitations  Physical Exam Vitals with BMI 09/03/2019 06/03/2019  Height 5\' 5"  5\' 5"   Weight 133 lbs 6 oz 140 lbs 10 oz  BMI 51.7 61.6  Systolic 073 710  Diastolic 79 70  Pulse 70 -    General:  No acute distress,  Alert and oriented, Non-Toxic, Normal speech and affect LLE with 12cm would lateral aspect of distal third.  Surrounding erythema but no purulence.  Foot has normal sensation and ROM.  Toes appear well perfused with good capillary refill.  Base of wound mostly fibrinous exudate.  Small exposure of likely peroneus brevis or longus tendon.   Assessment/Plan Complex lle wound.  She has an upcoming pet scan so we'll plan for surgery after that.  Discussed integra, wound vac, followed by skin graft at second stage if it takes.  Discussed risks nd benefits.   Explained complexity of situation and if it fails only other option if free tissue transfer which would have to be done else where.  She understnds.  Will contact her with surgery date.  Cindra Presume 09/03/2019, 3:15 PM

## 2019-09-05 ENCOUNTER — Encounter (HOSPITAL_BASED_OUTPATIENT_CLINIC_OR_DEPARTMENT_OTHER): Payer: Medicare Other | Admitting: Internal Medicine

## 2019-09-05 DIAGNOSIS — L97223 Non-pressure chronic ulcer of left calf with necrosis of muscle: Secondary | ICD-10-CM | POA: Diagnosis not present

## 2019-09-08 NOTE — Progress Notes (Signed)
[name], [name] ([id number]) Visit Report for 09/05/2019 Debridement Details Patient Name: Date of Service: [place], [name] 09/05/2019 1:15 PM Medical Record Number: [id number] Patient Account Number: [id number] Date of Birth/Sex: Treating RN: August 17, 1929 (86 y.o. [name] Primary Care Provider: PA [name], [name] Other Clinician: Referring Provider: Treating Provider/Extender: [name] in Treatment: 14 Debridement Performed for Assessment: Wound #1 Left,Lateral Lower Leg Performed By: Physician Ricard Dillon., MD Debridement Type: Chemical/Enzymatic/Mechanical Agent Used: [name] Level of Consciousness (Pre-procedure): Awake and Alert Pre-procedure Verification/Time Out No Taken: Bleeding: None Response to Treatment: Procedure was tolerated well Level of Consciousness (Post- Awake and Alert procedure): Post Debridement Measurements of Total Wound Length: (cm) 9.6 Width: (cm) 4.1 Depth: (cm) 0.5 Volume: (cm) 15.457 Character of Wound/Ulcer Post Debridement: Requires Further Debridement Post Procedure Diagnosis Same as Pre-procedure Electronic Signature(s) Signed: 09/05/2019 4:56:28 PM By: Kela Millin Signed: 09/08/2019 7:15:26 AM By: Linton Ham MD Entered By: Kela Millin on 09/05/2019 14:01:36 -------------------------------------------------------------------------------- HPI Details Patient Name: Date of Service: Sara RER, Sara Sara A. 09/05/2019 1:15 PM Medical Record Number: [id number] Patient Account Number: [id number] Date of Birth/Sex: Treating RN: 1929-05-22 (86 y.o. [name] Primary Care Provider: PA [name], [name] Other Clinician: Referring Provider: Treating Provider/Extender: [name] in Treatment: 14 History of Present Illness HPI Description: ADMISSION 05/27/2019 This is an 86 year old woman who came here for consideration of hyperbaric oxygen. She has a very complicated history. She is been  treated for diffuse large B-cell lymphoma since 2006. She has had 2 recurrences of this. Her most recent chemotherapy round was Treanda plus polatuzumab which ended I believe in February. She is not currently on chemotherapy. She came to see [place] with a large wound on the left lateral lower leg. Apparently this started as a lump sometime in October 2020. She received courses of antibiotics which did not help. She then had a biopsy and it actually showed lymphoma. The wound never really healed. And she developed a progressive ulcerated tumor. She underwent a course of radiation in late October into November 2020. This resulted in some improvement in the mass but she has been left with a deep punched out wound with exposed tendon. She was seen by Dr. Jaclynn Guarneri at the wound care center in [facility]. She considered her for operative debridement and/or consideration of hyperbaric oxygen. She was also seen in April on 05/07/2019 by Dr. Zigmund Daniel at the wound care center in [facility]. Arterial and venous reflux studies were ordered but I am unable to see these results. I think preparations were being made for hyperbaric oxygen therapy but the patient lives in [place] and so she arrives here for consideration of hyperbaric oxygen for soft tissue radiation injury Other factors in this is that she does not tolerate compression because of pain. She has been using [name] for a prolonged period of time for perhaps 5 weeks with some improvement in the wound surface. She has not had the wound rebiopsied but she did have a PET scan on 03/18/2027 that did show residual infiltrating soft tissue density involving the left lower calf but without discrete measurable mass and minimal uptake. 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 At the same time there was no new areas of metabolically active lymphoma identified. She therefore has not been on any ongoing  chemotherapy. She also had a CT scan of the left lower extremity on 05/21/2019 that did not show  any fracture or dislocation of the left tibia or fibula large soft tissue ulcer overlying the distal tibia no evidence of bony erosion or sclerosis to suggest osteomyelitis. 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 , The patient has had prior recent arterial studies we did not do an ABI in this clinic today. 5/18; wound looks about the same. Certainly no improvement in condition of the wound bed. She has been using [name] change daily. Deep necrotic wound on the left lateral lower calf 6/1; absolutely no change in this wound. Completely nonviable surface with tendon in the middle. She ran out of [name] last week in my absence and has been using what sounds like Vache solution. I will refax the [name] to [facility] I have her echocardiogram report. Her valve area is 1.25 mild to moderate AS. I think this is slightly worse than 3 years ago but this should not preclude her treatment with hyperbarics. I managed to creep in Dr. Kennon Holter interaction with his own nurse with regards to this question and I think he is approved that. I will text him just to make sure 6/15; some improvement in the wound surface using [name] with daily dressing changes. She is washing this off daily. Patient started hyperbarics today. 6/29; patient seen today in conjunction with HBO. She is doing dive #11 today. 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 She is midway through her course. 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 She has been using [name]. Making excellent improvement. She is tolerating HBO well 7/27; P patient seen in conjunction with HBO. 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 We have been using [name]. Continued improvement. She is tolerating HBO well. 08/29/19-Patient seen in conjunction with HBO, unfortunately this wound has become larger and has an area of necrosis at the inferior part of the wound with slough. Patient also has increased pain to the wound. She is 40 out of 42 HBO treatments at this time. She does have an appointment set up with general surgery to look at this wound. 8/20; the last time I saw this patient before I left on vacation we had a healthy looking wound surface that did not require debridement and had much more vibrant tissue. I changed her to [facility]. By the time she was seen the next time while I was away she had developed a considerably worse situation with almost 2 cm worsening in length with worsening of the wound inferiorly. Also the surface of the wound completely back to a nonviable surface. The patient blames the [facility] but I have never seen Hydrofera Blue do anything like this He went to see Dr. Claudia Desanctis of plastic surgery at our request. He talked about taking her to the OR and implying Integra with a wound VAC followed by a skin graft at a second stage if it takes. He gave her a less than 1 month timeframe for follow-up. She also has a repeat PET scan next week I understand Electronic Signature(s) Signed: 09/08/2019 7:15:26 AM By: Linton Ham MD Entered By: Linton Ham on 09/05/2019 14:14:06 -------------------------------------------------------------------------------- Physical Exam Details Patient Name: Date of Service: Sara RER, Sara Sara A. 09/05/2019 1:15 PM Medical Record Number: [id number] Patient Account Number: [id number] Date of Birth/Sex: Treating RN:  1929/03/11 (86 y.o. [name] Primary Care Provider: PA [name], [name] Other Clinician: Referring Provider: Treating Provider/Extender: [name] in Treatment: 14 Constitutional Sitting or standing Blood Pressure is within  target range for patient.. Pulse regular and within target range for patient.Marland Kitchen Respirations regular, non-labored and within target range.. Temperature is normal and within the target range for the patient.Marland Kitchen Appears in no distress. Cardiovascular Pedal pulse palpable. Edema control is good. Notes Wound exam; fortunately the left lower extremity lateral wound which was her initial radiation injury from lymphoma is not only larger than the last time I saw it but with a completely nonviable surface not too dissimilar from when she first came in this is really deflating. There is no evidence of surrounding infection although she has some mild erythema I think this is probably chronic venous inflammation or perhaps some degree of tissue destruction Electronic Signature(s) Signed: 09/08/2019 7:15:26 AM By: Linton Ham MD Entered By: Linton Ham on 09/05/2019 14:15:25 -------------------------------------------------------------------------------- Physician Orders Details Patient Name: Date of Service: [facility], Sara Sara A. 09/05/2019 1:15 PM Medical Record Number: [id number] Patient Account Number: [id number] Date of Birth/Sex: Treating RN: [birth year] (86 y.o. [name] Primary Care Provider: PA TIENT, [name] Other Clinician: Referring Provider: Treating Provider/Extender: [name] in Treatment: 14 Verbal / Phone Orders: No Diagnosis Coding ICD-10 Coding Code Description [phone] Non-pressure chronic ulcer of left calf with necrosis of muscle L59.9 Disorder of the skin and subcutaneous tissue related to radiation, unspecified C83.30 Diffuse large B-cell lymphoma, unspecified site Follow-up Appointments Return Appointment in 2 weeks. Dressing Change Frequency Wound #1 Left,Lateral Lower Leg Change Dressing every other day. Wound Cleansing Wound #1 Left,Lateral Lower Leg May shower and wash wound with soap and water. - on days that dressing is changed Primary  Wound Dressing Wound #1 Left,Lateral Lower Leg [name] Ointment - lightly moistened saline gauze over santyl Secondary Dressing Wound #1 Left,Lateral Lower Leg Kerlix/Rolled Gauze - secure with tape Dry Gauze Patient Medications llergies: apixaban, prednisone, hydrocodone A Notifications Medication Indication Start End 09/05/2019 [name] DOSE topical 250 unit/gram ointment - ointment topical to wound change daily Electronic Signature(s) Signed: 09/05/2019 2:09:48 PM By: Linton Ham MD Entered By: Linton Ham on 09/05/2019 14:09:47 -------------------------------------------------------------------------------- Problem List Details Patient Name: Date of Service: [facility], Sara Sara A. 09/05/2019 1:15 PM Medical Record Number: [id number] Patient Account Number: [id number] Date of Birth/Sex: Treating RN: 03-Nov-1929 (86 y.o. [name] Primary Care Provider: PA [name], [name] Other Clinician: Referring Provider: Treating Provider/Extender: [name] in Treatment: 14 Active Problems ICD-10 Encounter Code Description Active Date MDM Diagnosis L97.223 Non-pressure chronic ulcer of left calf with necrosis of muscle 05/27/2019 No Yes L59.9 Disorder of the skin and subcutaneous tissue related to radiation, unspecified 05/27/2019 No Yes C83.30 Diffuse large B-cell lymphoma, unspecified site 05/27/2019 No Yes Inactive Problems Resolved Problems Electronic Signature(s) Signed: 09/08/2019 7:15:26 AM By: Linton Ham MD Entered By: Linton Ham on 09/05/2019 14:12:00 -------------------------------------------------------------------------------- Progress Note Details Patient Name: Date of Service: Sara Benne, Sara Sara A. 09/05/2019 1:15 PM Medical Record Number: [id number] Patient Account Number: [id number] Date of Birth/Sex: Treating RN: February 12, 1929 (86 y.o. [name] Primary Care Provider: PA [name], [name] Other Clinician: Referring Provider: Treating  Provider/Extender: [name] in Treatment: 14 Subjective History of Present Illness (HPI) ADMISSION 05/27/2019 This is an 86 year old woman who came here for consideration of hyperbaric oxygen. She has a very complicated history. She is been treated for diffuse large B-cell lymphoma since 2006. She  has had 2 recurrences of this. Her most recent chemotherapy round was Treanda plus polatuzumab which ended I believe in February. She is not currently on chemotherapy. She came to see [place] with a large wound on the left lateral lower leg. Apparently this started as a lump sometime in October 2020. She received courses of antibiotics which did not help. She then had a biopsy and it actually showed lymphoma. The wound never really healed. And she developed a progressive ulcerated tumor. She underwent a course of radiation in late October into November 2020. This resulted in some improvement in the mass but she has been left with a deep punched out wound with exposed tendon. She was seen by Dr. Jaclynn Guarneri at the wound care center in [facility]. She considered her for operative debridement and/or consideration of hyperbaric oxygen. She was also seen in April on 05/07/2019 by Dr. Zigmund Daniel at the wound care center in [facility]. Arterial and venous reflux studies were ordered but I am unable to see these results. I think preparations were being made for hyperbaric oxygen therapy but the patient lives in [place] and so she arrives here for consideration of hyperbaric oxygen for soft tissue radiation injury Other factors in this is that she does not tolerate compression because of pain. She has been using [name] for a prolonged period of time for perhaps 5 weeks with some improvement in the wound surface. She has not had the wound rebiopsied but she did have a PET scan on 03/18/2027 that did show residual infiltrating soft tissue density involving the left lower calf but without discrete measurable mass  and minimal uptake. 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 At the same time there was no new areas of metabolically active lymphoma identified. She therefore has not been on any ongoing chemotherapy. She also had a CT scan of the left lower extremity on 05/21/2019 that did not show any fracture or dislocation of the left tibia or fibula large soft tissue ulcer overlying the distal tibia no evidence of bony erosion or sclerosis to suggest osteomyelitis. 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 , The patient has had prior recent arterial studies we did not do an ABI in this clinic today. 5/18; wound looks about the same. Certainly no improvement in condition of the wound bed. She has been using [name] change daily. Deep necrotic wound on the left lateral lower calf 6/1; absolutely no change in this wound. Completely nonviable surface with tendon in the middle. She ran out of [name] last week in my absence and has been using what sounds like Vache solution. I will refax the [name] to [facility] I have her echocardiogram report. Her valve area is 1.25 mild to moderate AS. I think this is slightly worse than 3 years ago but this should not preclude her treatment with hyperbarics. I managed to creep in Dr. Kennon Holter interaction with his own nurse with regards to this question and I think he is approved that. I will text him just to make sure 6/15; some improvement in the wound surface using [name] with daily dressing changes. She is washing this off daily. Patient started hyperbarics today. 6/29; patient seen today in conjunction with HBO. She is doing dive #11 today. Soft tissue radiation necrosis with a large wound on the left lateral lower  leg 7/13; patient seen in conjunction with HBO. She is midway through her course. Soft  tissue radionecrosis with a large wound on the left lower leg. She has been using [name]. Making excellent improvement. She is tolerating HBO well 7/27; P patient seen in conjunction with HBO. 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 We have been using [name]. Continued improvement. She is tolerating HBO well. 08/29/19-Patient seen in conjunction with HBO, unfortunately this wound has become larger and has an area of necrosis at the inferior part of the wound with slough. Patient also has increased pain to the wound. She is 40 out of 42 HBO treatments at this time. She does have an appointment set up with general surgery to look at this wound. 8/20; the last time I saw this patient before I left on vacation we had a healthy looking wound surface that did not require debridement and had much more vibrant tissue. I changed her to [facility]. By the time she was seen the next time while I was away she had developed a considerably worse situation with almost 2 cm worsening in length with worsening of the wound inferiorly. Also the surface of the wound completely back to a nonviable surface. The patient blames the [facility] but I have never seen Hydrofera Blue do anything like this He went to see Dr. Claudia Desanctis of plastic surgery at our request. He talked about taking her to the OR and implying Integra with a wound VAC followed by a skin graft at a second stage if it takes. He gave her a less than 1 month timeframe for follow-up. She also has a repeat PET scan next week I understand Objective Constitutional Sitting or standing Blood Pressure is within target range for patient.. Pulse regular and within target range for patient.Marland Kitchen Respirations regular, non-labored and within target range.. Temperature is normal and within the target range for the patient.Marland Kitchen Appears in no distress. Vitals Time Taken: 1:36 PM, Height: 65 in, Weight: 138 lbs, BMI: 23, Temperature: 98.3 F, Pulse:  65 bpm, Respiratory Rate: 16 breaths/min, Blood Pressure: 126/75 mmHg. Cardiovascular Pedal pulse palpable. Edema control is good. General Notes: Wound exam; fortunately the left lower extremity lateral wound which was her initial radiation injury from lymphoma is not only larger than the last time I saw it but with a completely nonviable surface not too dissimilar from when she first came in this is really deflating. There is no evidence of surrounding infection although she has some mild erythema I think this is probably chronic venous inflammation or perhaps some degree of tissue destruction Integumentary (Hair, Skin) Wound #1 status is Open. Original cause of wound was Bump. The wound is located on the Left,Lateral Lower Leg. The wound measures 9.6cm length x 4.1cm width x 0.5cm depth; 30.913cm^2 area and 15.457cm^3 volume. There is muscle, tendon, and Fat Layer (Subcutaneous Tissue) Exposed exposed. There is no tunneling or undermining noted. There is a medium amount of serosanguineous drainage noted. The wound margin is distinct with the outline attached to the wound base. There is medium (34-66%) pink granulation within the wound bed. There is a medium (34-66%) amount of necrotic tissue within the wound bed including Adherent Slough. Assessment Active Problems ICD-10 Non-pressure chronic ulcer of left calf with necrosis of muscle Disorder of the skin and subcutaneous tissue related to radiation, unspecified Diffuse large B-cell lymphoma, unspecified site Procedures Wound #1 Pre-procedure diagnosis of Wound #1 is a Soft Tissue Radionecrosis located on  the Left,Lateral Lower Leg . There was a Chemical/Enzymatic/Mechanical debridement performed by Ricard Dillon., MD.. Agent used was [name]. There was no bleeding. The procedure was tolerated well. Post Debridement Measurements: 9.6cm length x 4.1cm width x 0.5cm depth; 15.457cm^3 volume. Character of Wound/Ulcer Post Debridement  requires further debridement. Post procedure Diagnosis Wound #1: Same as Pre-Procedure Plan Follow-up Appointments: Return Appointment in 2 weeks. Dressing Change Frequency: Wound #1 Left,Lateral Lower Leg: Change Dressing every other day. Wound Cleansing: Wound #1 Left,Lateral Lower Leg: May shower and wash wound with soap and water. - on days that dressing is changed Primary Wound Dressing: Wound #1 Left,Lateral Lower Leg: [name] Ointment - lightly moistened saline gauze over santyl Secondary Dressing: Wound #1 Left,Lateral Lower Leg: Kerlix/Rolled Gauze - secure with tape Dry Gauze The following medication(s) was prescribed: [name] topical 250 unit/gram ointment ointment topical to wound change daily starting 09/05/2019 #1 she is using [name] moistened with saline gauze and kderlix. I think this is reasonable. 2. The patient has a follow-up PET scan next week hopefully not recurrent lymphoma as a cause for this deterioration. Hopefully we will see the result of this. She will then follow-up with Dr. Claudia Desanctis to see if he still feels there is a surgical option to closure of this. 3. I am really disappointed about the outcome here. The tissue here looks much more vibrant 3 weeks ago healthy and looks like it was ready for surgical intervention as dictated by Dr. Claudia Desanctis or skin substitutes of some description. Everything is deteriorated here the surface condition especially let alone the surface area. 4. In view of this I cannot justify continuing hyperbarics. At least not until looking at the PET scan Electronic Signature(s) Signed: 09/08/2019 7:15:26 AM By: Linton Ham MD Entered By: Linton Ham on 09/05/2019 14:19:02 -------------------------------------------------------------------------------- SuperBill Details Patient Name: Date of Service: Sara Benne, Sara Sara A. 09/05/2019 Medical Record Number: [id number] Patient Account Number: [id number] Date of Birth/Sex: Treating  RN: 1929-03-21 (86 y.o. [name] Primary Care Provider: PA TIENT, [name] Other Clinician: Referring Provider: Treating Provider/Extender: [name] in Treatment: 14 Diagnosis Coding ICD-10 Codes Code Description [phone] Non-pressure chronic ulcer of left calf with necrosis of muscle L59.9 Disorder of the skin and subcutaneous tissue related to radiation, unspecified C83.30 Diffuse large B-cell lymphoma, unspecified site Facility Procedures CPT4 Code: [id number] Description: [phone] - DEBRIDE W/O ANES NON SELECT Modifier: Quantity: 1 Physician Procedures : CPT4 Code Description Modifier [id number] - WC PHYS LEVEL 3 - EST PT ICD-10 Diagnosis Description L97.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

## 2019-09-09 NOTE — Progress Notes (Signed)
MRYTLE, BENTO ([id number]) Visit Report for 09/05/2019 Arrival Information Details Patient Name: Date of Service: [place], [name] 09/05/2019 1:15 PM Medical Record Number: [id number] Patient Account Number: [id number] Date of Birth/Sex: Treating RN: 07-12-1929 (86 y.o. [name] Primary Care Cheyla Duchemin: PA TIENT, NO Other Clinician: Referring Avon Molock: Treating Shaquill Iseman/Extender: [name] in Treatment: 14 Visit Information History Since Last Visit Added or deleted any medications: No Patient Arrived: Walker Any new allergies or adverse reactions: No Arrival Time: 13:34 Had a fall or experienced change in No Accompanied By: self activities of daily living that may affect Transfer Assistance: None risk of falls: Patient Identification Verified: Yes Signs or symptoms of abuse/neglect since last visito No Secondary Verification Process Completed: Yes Hospitalized since last visit: No Patient Requires Transmission-Based Precautions: No Implantable device outside of the clinic excluding No Patient Has Alerts: No cellular tissue based products placed in the center since last visit: Has Dressing in Place as Prescribed: Yes Pain Present Now: Yes Electronic Signature(s) Signed: 09/08/2019 2:53:33 PM By: Sandre Kitty Entered By: Sandre Kitty on 09/05/2019 13:36:34 -------------------------------------------------------------------------------- Encounter Discharge Information Details Patient Name: Date of Service: [name], [name] 09/05/2019 1:15 PM Medical Record Number: [id number] Patient Account Number: [id number] Date of Birth/Sex: Treating RN: 01/30/1929 (86 y.o. [name] Primary Care Arya Luttrull: PA [name], [name] Other Clinician: Referring Emiliya Chretien: Treating Mareli Antunes/Extender: [name] in Treatment: 14 Encounter Discharge Information Items Post Procedure Vitals Discharge Condition: Stable Temperature (F):  98.3 Ambulatory Status: Walker Pulse (bpm): 65 Discharge Destination: Home Respiratory Rate (breaths/min): 16 Transportation: Private Auto Blood Pressure (mmHg): 126/75 Accompanied By: self Schedule Follow-up Appointment: Yes Clinical Summary of Care: Patient Declined Electronic Signature(s) Signed: 09/05/2019 5:19:06 PM By: Kela Millin Entered By: Kela Millin on 09/05/2019 17:16:17 -------------------------------------------------------------------------------- Lower Extremity Assessment Details Patient Name: Date of Service: [place], [name] 09/05/2019 1:15 PM Medical Record Number: [id number] Patient Account Number: [id number] Date of Birth/Sex: Treating RN: [birth year] (86 y.o. [name] Primary Care Jushua Waltman: PA [name], [name] Other Clinician: Referring Zeddie Njie: Treating Cordelle Dahmen/Extender: [name] in Treatment: 14 Edema Assessment Assessed: [Left: No] [Right: No] Edema: [Left: Ye] [Right: s] Calf Left: Right: Point of Measurement: cm From Medial Instep 34 cm cm Ankle Left: Right: Point of Measurement: cm From Medial Instep 19 cm cm Vascular Assessment Pulses: Dorsalis Pedis Palpable: [Left:Yes] Electronic Signature(s) Signed: 09/05/2019 4:56:28 PM By: Kela Millin Entered By: Kela Millin on 09/05/2019 13:49:25 -------------------------------------------------------------------------------- Multi Wound Chart Details Patient Name: Date of Service: [name], [name] 09/05/2019 1:15 PM Medical Record Number: [id number] Patient Account Number: [id number] Date of Birth/Sex: Treating RN: [birth year] (86 y.o. 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] [N/A:N/A] Primary Etiology: [1:Cataracts, Deep Vein Thrombosis,] [N/A:N/A] Comorbid History: [1:Hypertension, Osteoarthritis, Neuropathy, Received Chemotherapy, Received Radiation, Confinement Anxiety 10/17/2018] [N/A:N/A] Date Acquired: [1:14] [N/A:N/A] Weeks of Treatment: [1:Open] [N/A:N/A] Wound Status: [1:9.6x4.1x0.5] [N/A:N/A] Measurements L x W x D (cm) [1:30.913] [N/A:N/A] A (cm) : rea [1:15.457] [N/A:N/A] Volume (cm) : [1:-56.40%] [N/A:N/A] % Reduction in Area: [1:-95.60%] [N/A:N/A] % Reduction in Volume: [1:Full Thickness With Exposed Support N/A] Classification: [1:Structures Medium] [N/A:N/A] Exudate A mount: [1:Serosanguineous] [N/A:N/A] Exudate Type: [1:red, brown] [N/A:N/A]  Exudate Color: [1:Distinct, outline attached] [N/A:N/A] Wound Margin: [1:Medium (34-66%)] [N/A:N/A] Granulation A mount: [1:Pink] [N/A:N/A] Granulation Quality: [1:Medium (34-66%)] [N/A:N/A] Necrotic A mount: [1:Fat Layer (Subcutaneous Tissue): Yes N/A] Exposed Structures: [1:Tendon: Yes Muscle: Yes Fascia: No Joint: No Bone: No Small (1-33%)] [N/A:N/A] Epithelialization: [1:Chemical/Enzymatic/Mechanical] [N/A:N/A] Debridement: [1:N/A] [N/A:N/A] Instrument: [1:None] [N/A:N/A] Bleeding: Debridement Treatment Response: Procedure was tolerated well [N/A:N/A] Post Debridement Measurements L x 9.6x4.1x0.5 [N/A:N/A] W x D (cm) [1:15.457] [N/A:N/A] Post Debridement Volume: (cm) [1:Debridement] [N/A:N/A] Treatment Notes Electronic Signature(s) Signed: 09/05/2019 4:56:28 PM By: Kela Millin Signed: 09/08/2019 7:15:26 AM By: Linton Ham MD Entered By: Linton Ham on 09/05/2019 14:12:08 -------------------------------------------------------------------------------- Multi-Disciplinary Care Plan Details Patient Name: Date of  Service: [place], [name] 09/05/2019 1:15 PM Medical Record Number: [id number] Patient Account Number: [id number] Date of Birth/Sex: Treating RN: January 20, 1929 (86 y.o. [name] Primary Care Christophr Calix: PA [name], [name] Other Clinician: Referring Germaine Shenker: Treating Jamayah Myszka/Extender: [name] in Treatment: 14 Active Inactive Wound/Skin Impairment Nursing Diagnoses: Knowledge deficit related to ulceration/compromised skin integrity Goals: Patient/caregiver will verbalize understanding of skin care regimen Date Initiated: 05/27/2019 Target Resolution Date: 10/03/2019 Goal Status: Active Ulcer/skin breakdown will have a volume reduction of 30% by week 4 Date Initiated: 05/27/2019 Date Inactivated: 07/01/2019 Target Resolution Date: 06/27/2019 Goal Status: Unmet Unmet Reason: comorbities Ulcer/skin breakdown will have a volume reduction of 50% by week 8 Date Initiated: 07/01/2019 Target Resolution Date: 10/03/2019 Goal Status: Active Interventions: Assess patient/caregiver ability to obtain necessary supplies Assess patient/caregiver ability to perform ulcer/skin care regimen upon admission and as needed Assess ulceration(s) every visit Notes: Electronic Signature(s) Signed: 09/05/2019 4:56:28 PM By: Kela Millin Entered By: Kela Millin on 09/05/2019 13:51:07 -------------------------------------------------------------------------------- Pain Assessment Details Patient Name: Date of Service: [place], [name] 09/05/2019 1:15 PM Medical Record Number: [id number] Patient Account Number: [id number] Date of Birth/Sex: Treating RN: 14-Nov-1929 (86 y.o. [name] Primary Care Zarra Geffert: PA [name], [name] Other Clinician: Referring Mashawn Brazil: Treating Juliya Magill/Extender: [name] in Treatment: 14 Active Problems Location of Pain Severity and Description of Pain Patient Has Paino Yes Site Locations Rate the pain. Current Pain Level:  5 Pain Management and Medication Current Pain Management: Electronic Signature(s) Signed: 09/05/2019 4:56:28 PM By: Kela Millin Signed: 09/08/2019 2:53:33 PM By: Sandre Kitty Entered By: Sandre Kitty on 09/05/2019 13:37:00 -------------------------------------------------------------------------------- Patient/Caregiver Education Details Patient Name: Date of Service: [name], [name] 8/20/2021andnbsp1:15 PM Medical Record Number: [id number] Patient Account Number: [id number] Date of Birth/Gender: Treating RN: 03-11-1929 (86 y.o. [name] Primary Care Physician: PA [name], [name] Other Clinician: Referring Physician: Treating Physician/Extender: [name] in Treatment: 14 Education Assessment Education Provided To: Patient Education Topics Provided Wound/Skin Impairment: Handouts: Caring for Your Ulcer Methods: Explain/Verbal Responses: State content correctly Electronic Signature(s) Signed: 09/05/2019 4:56:28 PM By: Kela Millin Entered By: Kela Millin on 09/05/2019 13:51:21 -------------------------------------------------------------------------------- Wound Assessment Details Patient Name: Date of Service: [name], [name] 09/05/2019 1:15 PM Medical Record Number: [id number] Patient Account Number: [id number] Date of Birth/Sex: Treating RN: 07-14-1929 (86 y.o. [name] Primary Care Sameera Betton: PA [name], [name] Other Clinician: Referring Darel Ricketts: Treating Daphnee Preiss/Extender: [name] in Treatment: 14 Wound Status Wound Number: 1 Primary Soft Tissue Radionecrosis Etiology: Wound Location: Left, Lateral Lower Leg Wound Open Wounding Event: Bump Status: Date Acquired: 10/17/2018 Comorbid Cataracts, Deep Vein Thrombosis, Hypertension, Osteoarthritis, Weeks Of Treatment: 14 History: Neuropathy, Received Chemotherapy, Received Radiation, Clustered Wound: No Confinement Anxiety Photos Photo  Uploaded By: Mikeal Hawthorne on 09/08/2019 12:11:47 Wound Measurements  Length: (cm) 9.6 Width: (cm) 4.1 Depth: (cm) 0.5 Area: (cm) 30.913 Volume: (cm) 15.457 % Reduction in Area: -56.4% % Reduction in Volume: -95.6% Epithelialization: Small (1-33%) Tunneling: No Undermining: No Wound Description Classification: Full Thickness With Exposed Support Structures Wound Margin: Distinct, outline attached Exudate Amount: Medium Exudate Type: Serosanguineous Exudate Color: red, brown Wound Bed Granulation Amount: Medium (34-66%) Granulation Quality: Pink Necrotic Amount: Medium (34-66%) Necrotic Quality: Adherent Slough Foul Odor After Cleansing: No Slough/Fibrino Yes Exposed Structure Fascia Exposed: No Fat Layer (Subcutaneous Tissue) Exposed: Yes Tendon Exposed: Yes Muscle Exposed: Yes Necrosis of Muscle: No Joint Exposed: No Bone Exposed: No Treatment Notes Wound #1 (Left, Lateral Lower Leg) 1. Cleanse With Wound Cleanser Soap and water 3. Primary Dressing Applied [name] 4. Secondary Dressing ABD Pad Roll Gauze Other secondary dressing (specify in notes) Notes saline moistened gauze. netting Electronic Signature(s) Signed: 09/05/2019 4:56:28 PM By: Kela Millin Entered By: Kela Millin on 09/05/2019 13:49:47 -------------------------------------------------------------------------------- Vitals Details Patient Name: Date of Service: [facility], [name] 09/05/2019 1:15 PM Medical Record Number: [id number] Patient Account Number: [id number] Date of Birth/Sex: Treating RN: 1929-09-20 (86 y.o. [name] Primary Care Zerek Litsey: PA [place], [name] Other Clinician: Referring Geofrey Silliman: Treating Kendan Cornforth/Extender: [name] in Treatment: 14 Vital Signs Time Taken: 13:36 Temperature (F): 98.3 Height (in): 65 Pulse (bpm): 65 Weight (lbs): 138 Respiratory Rate (breaths/min): 16 Body Mass Index (BMI): 23 Blood Pressure (mmHg):  126/75 Reference Range: 80 - 120 mg / dl Electronic Signature(s) Signed: 09/08/2019 2:53:33 PM By: Sandre Kitty Entered By: Sandre Kitty on 09/05/2019 13:36:52

## 2019-09-11 ENCOUNTER — Telehealth: Payer: Self-pay | Admitting: *Deleted

## 2019-09-11 ENCOUNTER — Telehealth: Payer: Self-pay

## 2019-09-11 NOTE — Telephone Encounter (Signed)
   [facility] Pre-operative Risk Assessment    HEARTCARE STAFF: - Please ensure there is not already an duplicate clearance open for this procedure. - Under Visit Info/Reason for Call, type in Other and utilize the format Clearance MM/DD/YY or Clearance TBD. Do not use dashes or single digits. - If request is for dental extraction, please clarify the # of teeth to be extracted.  Request for surgical clearance:  1. What type of surgery is being performed? Debridement left leg wound with placement of integra bilayer and wound vac   2. When is this surgery scheduled? TBD  3. What type of clearance is required (medical clearance vs. Pharmacy clearance to hold med vs. Both)? medical  4. Are there any medications that need to be held prior to surgery and how long? none  5. Practice name and name of physician performing surgery? [facility]   6. What is the office phone number? [phone]   7.   What is the office fax number? [phone]  8.   Anesthesia type (None, local, MAC, general) ? general   [name] 09/11/2019, 3:26 PM  _________________________________________________________________   (provider comments below)

## 2019-09-11 NOTE — Telephone Encounter (Signed)
Call to pt- @ ph# [phone]- pt was unable to hear me- & I was unable to leave message I also called the pt's contact- daughter Almyra Free @ ph# [phone]- no answer- I left v/m requesting call back on pt's behalf

## 2019-09-11 NOTE — Telephone Encounter (Signed)
Left VM

## 2019-09-12 NOTE — Telephone Encounter (Signed)
   Primary Cardiologist: Dr. Gwenlyn Found   Chart reviewed as part of pre-operative protocol coverage. Given past medical history and time since last visit, based on ACC/AHA guidelines, [name] would be at acceptable risk for the planned procedure without further cardiovascular testing  I have spoken to the patient by phone to assess her and notify . er of the clearance .   I will route this recommendation to the requesting party via Epic fax function and remove from pre-op pool.  Please call with questions.  Phill Myron. Sybrina Laning DNP, ANP, AACC  09/12/2019, 10:04 AM

## 2019-09-13 NOTE — Progress Notes (Signed)
CHRYSTINE, [name] ([id number]) Visit Report for 08/29/2019 Arrival Information Details Patient Name: Date of Service: [place], [name] 08/29/2019 2:15 PM Medical Record Number: [id number] Patient Account Number: [id number] Date of Birth/Sex: Treating RN: December 02, 1929 (86 y.o. [name] Primary Care Tamu Golz: PA TIENT, NO Other Clinician: Referring Rosalita Carey: Treating Skylier Kretschmer/Extender: Beverly Gust in Treatment: 13 Visit Information History Since Last Visit Added or deleted any medications: No Patient Arrived: Walker Any new allergies or adverse reactions: No Arrival Time: 14:30 Had a fall or experienced change in No Accompanied By: self activities of daily living that may affect Transfer Assistance: None risk of falls: Patient Identification Verified: Yes Signs or symptoms of abuse/neglect since last visito No Secondary Verification Process Completed: Yes Hospitalized since last visit: No Patient Requires Transmission-Based Precautions: No Implantable device outside of the clinic excluding No Patient Has Alerts: No cellular tissue based products placed in the center since last visit: Has Dressing in Place as Prescribed: Yes Pain Present Now: No Electronic Signature(s) Signed: 09/01/2019 9:11:13 AM By: Sandre Kitty Entered By: Sandre Kitty on 08/29/2019 14:32:57 -------------------------------------------------------------------------------- Encounter Discharge Information Details Patient Name: Date of Service: Sara RER, [name] 08/29/2019 2:15 PM Medical Record Number: [id number] Patient Account Number: [id number] Date of Birth/Sex: Treating RN: 1929-09-17 (86 y.o. [name] Primary Care Calaya Gildner: PA [name], [name] Other Clinician: Referring Laronn Devonshire: Treating Haruka Kowaleski/Extender: Beverly Gust in Treatment: 13 Encounter Discharge Information Items Post Procedure Vitals Discharge Condition: Stable Temperature (F):  97.6 Ambulatory Status: Walker Pulse (bpm): 68 Discharge Destination: Home Respiratory Rate (breaths/min): 18 Transportation: Private Auto Blood Pressure (mmHg): 117/76 Accompanied By: self Schedule Follow-up Appointment: Yes Clinical Summary of Care: Patient Declined Electronic Signature(s) Signed: 09/02/2019 5:25:51 PM By: Baruch Gouty RN, BSN Entered By: Baruch Gouty on 08/29/2019 17:28:11 -------------------------------------------------------------------------------- Lower Extremity Assessment Details Patient Name: Date of Service: [place], [name] 08/29/2019 2:15 PM Medical Record Number: [id number] Patient Account Number: [id number] Date of Birth/Sex: Treating RN: [birth year] (86 y.o. [name] Primary Care Kendarious [name]: PA [name], [name] Other Clinician: Referring [name]: Treating Starkisha Tullis/Extender: Beverly Gust in Treatment: 13 Edema Assessment Assessed: [Left: No] [Right: No] Edema: [Left: Ye] [Right: s] Calf Left: Right: Point of Measurement: cm From Medial Instep 34 cm cm Ankle Left: Right: Point of Measurement: cm From Medial Instep 19 cm cm Electronic Signature(s) Signed: 09/12/2019 5:50:16 PM By: Carlene Coria RN Entered By: Carlene Coria on 08/29/2019 16:38:06 -------------------------------------------------------------------------------- [place] Details Patient Name: Date of Service: [place], [name] 08/29/2019 2:15 PM Medical Record Number: [id number] Patient Account Number: [id number] Date of Birth/Sex: Treating RN: 1929/08/24 (86 y.o. [name] Primary Care Trajan Grove: PA TIENT, NO Other Clinician: Referring Asako Saliba: Treating [name]/Extender: Beverly Gust in Treatment: 13 Active Inactive HBO Nursing Diagnoses: Anxiety related to feelings of confinement associated with the hyperbaric oxygen chamber Anxiety related to knowledge deficit of hyperbaric oxygen therapy and treatment  procedures Goals: Patient and/or family will be able to state/discuss factors appropriate to the management of their disease process during treatment Date Initiated: 05/27/2019 Target Resolution Date: 09/05/2019 Goal Status: Active Patient will tolerate the hyperbaric oxygen therapy treatment Date Initiated: 05/27/2019 Target Resolution Date: 09/05/2019 Goal Status: Active Patient will tolerate the internal climate of the chamber Date Initiated: 05/27/2019 Target Resolution Date: 09/05/2019 Goal Status: Active Patient/caregiver will verbalize understanding of HBO goals, rationale, procedures and potential hazards Date Initiated: 05/27/2019 Target Resolution Date: 09/05/2019 Goal Status: Active Interventions: Administer a five (5) minute air break  for patient if signs and symptoms of seizure appear and notify the hyperbaric physician Administer decongestants, per physician orders, prior to HBO2 Administer the correct therapeutic gas delivery based on the patients needs and limitations, per physician order Assess and provide for patients comfort related to the hyperbaric environment and equalization of middle ear Assess for signs and symptoms related to adverse events, including but not limited to confinement anxiety, pneumothorax, oxygen toxicity and baurotrauma Assess patient's knowledge and expectations regarding hyperbaric medicine and provide education related to the hyperbaric environment, goals of treatment and prevention of adverse events Notes: Wound/Skin Impairment Nursing Diagnoses: Knowledge deficit related to ulceration/compromised skin integrity Goals: Patient/caregiver will verbalize understanding of skin care regimen Date Initiated: 05/27/2019 Target Resolution Date: 09/05/2019 Goal Status: Active Ulcer/skin breakdown will have a volume reduction of 30% by week 4 Date Initiated: 05/27/2019 Date Inactivated: 07/01/2019 Target Resolution Date: 06/27/2019 Goal Status:  Unmet Unmet Reason: comorbities Ulcer/skin breakdown will have a volume reduction of 50% by week 8 Date Initiated: 07/01/2019 Target Resolution Date: 09/05/2019 Goal Status: Active Interventions: Assess patient/caregiver ability to obtain necessary supplies Assess patient/caregiver ability to perform ulcer/skin care regimen upon admission and as needed Assess ulceration(s) every visit Notes: Electronic Signature(s) Signed: 08/29/2019 4:53:28 PM By: Kela Millin Entered By: Kela Millin on 08/29/2019 14:26:02 -------------------------------------------------------------------------------- Pain Assessment Details Patient Name: Date of Service: [place], [name] 08/29/2019 2:15 PM Medical Record Number: [id number] Patient Account Number: [id number] Date of Birth/Sex: Treating RN: 05/16/1929 (86 y.o. [name] Primary Care Mariafernanda Hendricksen: PA [name], [place] Other Clinician: Referring Effie Wahlert: Treating Kanyon Seibold/Extender: Beverly Gust in Treatment: 13 Active Problems Location of Pain Severity and Description of Pain Patient Has Paino Yes Site Locations Rate the pain. Rate the pain. Current Pain Level: 5 Pain Management and Medication Current Pain Management: Electronic Signature(s) Signed: 08/29/2019 4:53:28 PM By: Kela Millin Signed: 09/01/2019 9:11:13 AM By: Sandre Kitty Entered By: Sandre Kitty on 08/29/2019 14:35:40 -------------------------------------------------------------------------------- Patient/Caregiver Education Details Patient Name: Date of Service: [name], [name] 8/13/2021andnbsp2:15 PM Medical Record Number: [id number] Patient Account Number: [id number] Date of Birth/Gender: Treating RN: December 16, 1929 (86 y.o. [name] Primary Care Physician: PA [name], [place] Other Clinician: Referring Physician: Treating Physician/Extender: Beverly Gust in Treatment: 13 Education Assessment Education Provided  To: Patient Education Topics Provided Wound/Skin Impairment: Handouts: Caring for Your Ulcer Methods: Explain/Verbal Responses: State content correctly Electronic Signature(s) Signed: 08/29/2019 4:53:28 PM By: Kela Millin Entered By: Kela Millin on 08/29/2019 14:26:16 -------------------------------------------------------------------------------- Wound Assessment Details Patient Name: Date of Service: [place], [name] 08/29/2019 2:15 PM Medical Record Number: [id number] Patient Account Number: [id number] Date of Birth/Sex: Treating RN: January 17, 1930 (86 y.o. [name] Primary Care Percy Winterrowd: PA TIENT, NO Other Clinician: Referring Siyah Mault: Treating Tywone Bembenek/Extender: Beverly Gust in Treatment: 13 Wound Status Wound Number: 1 Primary Soft Tissue Radionecrosis Etiology: Wound Location: Left, Lateral Lower Leg Wound Open Wounding Event: Bump Status: Date Acquired: 10/17/2018 Comorbid Cataracts, Deep Vein Thrombosis, Hypertension, Osteoarthritis, Weeks Of Treatment: 13 History: Neuropathy, Received Chemotherapy, Received Radiation, Clustered Wound: No Confinement Anxiety Photos Photo Uploaded By: Mikeal Hawthorne on 09/02/2019 11:40:23 Wound Measurements Length: (cm) 9.5 Width: (cm) 4 Depth: (cm) 0.4 Area: (cm) 29.845 Volume: (cm) 11.938 % Reduction in Area: -51% % Reduction in Volume: -51% Epithelialization: Small (1-33%) Tunneling: No Undermining: No Wound Description Classification: Full Thickness With Exposed Support Structures Wound Margin: Distinct, outline attached Exudate Amount: Medium Exudate Type: Serosanguineous Exudate Color: red, brown Foul Odor After Cleansing: No Slough/Fibrino Yes  Wound Bed Granulation Amount: Small (1-33%) Exposed Structure Granulation Quality: Pink Fascia Exposed: No Necrotic Amount: Large (67-100%) Fat Layer (Subcutaneous Tissue) Exposed: Yes Necrotic Quality: Adherent Slough Tendon Exposed:  Yes Muscle Exposed: Yes Necrosis of Muscle: No Joint Exposed: No Bone Exposed: No Electronic Signature(s) Signed: 09/12/2019 5:50:16 PM By: Carlene Coria RN Entered By: Carlene Coria on 08/29/2019 16:38:34 -------------------------------------------------------------------------------- Vitals Details Patient Name: Date of Service: [name], [name] 08/29/2019 2:15 PM Medical Record Number: [id number] Patient Account Number: [id number] Date of Birth/Sex: Treating RN: 1929-08-04 (86 y.o. [name] Primary Care Rabiah Goeser: PA TIENT, NO Other Clinician: Referring Taeden Geller: Treating Acel Natzke/Extender: Beverly Gust in Treatment: 13 Vital Signs Time Taken: 14:35 Temperature (F): 97.6 Height (in): 65 Pulse (bpm): 68 Weight (lbs): 138 Respiratory Rate (breaths/min): 16 Body Mass Index (BMI): 23 Blood Pressure (mmHg): 117/76 Reference Range: 80 - 120 mg / dl Electronic Signature(s) Signed: 09/01/2019 9:11:13 AM By: Sandre Kitty Entered By: Sandre Kitty on 08/29/2019 14:35:22

## 2019-09-16 ENCOUNTER — Other Ambulatory Visit: Payer: Self-pay

## 2019-09-16 ENCOUNTER — Ambulatory Visit (INDEPENDENT_AMBULATORY_CARE_PROVIDER_SITE_OTHER): Payer: Medicare Other | Admitting: Surgical

## 2019-09-16 ENCOUNTER — Encounter: Payer: Self-pay | Admitting: Surgical

## 2019-09-16 DIAGNOSIS — S81802A Unspecified open wound, left lower leg, initial encounter: Secondary | ICD-10-CM

## 2019-09-16 MED ORDER — HYDROCODONE-ACETAMINOPHEN 5-325 MG PO TABS: 1.0000 | ORAL_TABLET | Freq: Four times a day (QID) | ORAL | 0 refills | Status: AC | PRN

## 2019-09-16 MED ORDER — ONDANSETRON HCL 4 MG PO TABS: 4.0000 mg | ORAL_TABLET | Freq: Three times a day (TID) | ORAL | 0 refills | Status: AC | PRN

## 2019-09-16 NOTE — Progress Notes (Signed)
Patient ID: [name], female    DOB: [birth year], 86 y.o.   MRN: [id number]  Chief Complaint  Patient presents with  . Pre-op Exam      ICD-10-CM   1. Wound of left lower extremity, initial encounter  S81.802A      History of Present Illness: [name] is a 86 y.o.  female  with a history of left leg radiation for lymphoma and subsequently developed a chronic LLE wound .  She presents for preoperative evaluation for upcoming procedure, excision of LLE wound, placement of integra and application of wound vac, scheduled for 09/26/19 with Dr. Claudia Desanctis  The patient has not had problems with anesthesia. History of DVT in the affected leg, approximately 6 years ago.  No family history of DVT/PE.  No family or personal history of bleeding or clotting disorders.  Patient is not currently taking any blood thinners.  No history of CVA/MI.  She reports she has overall been feeling well. No recent CP/SOB/Weakness, dizziness.  PMH Significant for: non hodgkin's lymphoma since 2006 with hx of chemo and radiation, aortic stenosis Cardiac clearance received for patient.  The patient gave consent to have this visit done by telemedicine / virtual visit.  This is also consent for access the chart and treat the patient via this visit. The patient is located at home.  I, the provider, am at the office.  We spent 10 minutes together for the visit.  Joined by telephone.  Past Medical History: Allergies: Allergies  Allergen Reactions  . Prednisone Other (See Comments)  . Apixaban Itching    Rash, muscle pain, and and weakness also presented  . Hydrocodone-Acetaminophen Nausea And Vomiting    Current Medications:  Current Outpatient Medications:  .  allopurinol (ZYLOPRIM) 100 MG tablet, Take 100 mg by mouth daily. (Patient not taking: Reported on 09/03/2019), Disp: , Rfl:  .  Calcium Carb-Ergocalciferol 500-200 MG-UNIT TABS, Take by mouth., Disp: , Rfl:  .  citalopram (CELEXA) 20 MG  tablet, TAKE 1 TABLET DAILY, Disp: , Rfl:  .  collagenase ([name]) ointment, , Disp: , Rfl:  .  cyanocobalamin 1000 MCG tablet, Take by mouth. (Patient not taking: Reported on 09/03/2019), Disp: , Rfl:  .  diclofenac Sodium (VOLTAREN) 1 % GEL, Apply topically twice a day as needed, Disp: , Rfl:  .  Glucosamine Sulfate 500 MG TABS, Take by mouth., Disp: , Rfl:  .  ibuprofen (ADVIL) 200 MG tablet, Take by mouth., Disp: , Rfl:  .  lidocaine-prilocaine (EMLA) cream, Apply to wound daily, Disp: , Rfl:  .  Magnesium Chloride 64 MG TABS, Take by mouth., Disp: , Rfl:  .  Multiple Vitamin (MULTI-VITAMIN) tablet, Take by mouth., Disp: , Rfl:  .  niacin 500 MG CR capsule, Take by mouth., Disp: , Rfl:  .  omeprazole (PRILOSEC) 20 MG capsule, TAKE 1 CAPSULE DAILY, Disp: , Rfl:  .  ondansetron (ZOFRAN) 8 MG tablet, Take by mouth., Disp: , Rfl:  .  potassium chloride (MICRO-K) 10 MEQ CR capsule, Take by mouth., Disp: , Rfl:  .  Probiotic CAPS, Take by mouth., Disp: , Rfl:   Past Medical Problems: History reviewed. No pertinent past medical history.  Past Surgical History: History reviewed. No pertinent surgical history.  Social History: Social History   Socioeconomic History  . Marital status: Widowed    Spouse name: Not on file  . Number of children: Not on file  . Years of education: Not on file  .  Highest education level: Not on file  Occupational History  . Not on file  Tobacco Use  . Smoking status: Never Smoker  . Smokeless tobacco: Never Used  Substance and Sexual Activity  . Alcohol use: Not on file  . Drug use: Not on file  . Sexual activity: Not on file  Other Topics Concern  . Not on file  Social History Narrative  . Not on file   Social Determinants of Health   Financial Resource Strain:   . Difficulty of Paying Living Expenses: Not on file  Food Insecurity:   . Worried About Charity fundraiser in the Last Year: Not on file  . Ran Out of Food in the Last Year: Not on  file  Transportation Needs:   . Lack of Transportation (Medical): Not on file  . Lack of Transportation (Non-Medical): Not on file  Physical Activity:   . Days of Exercise per Week: Not on file  . Minutes of Exercise per Session: Not on file  Stress:   . Feeling of Stress : Not on file  Social Connections:   . Frequency of Communication with Friends and Family: Not on file  . Frequency of Social Gatherings with Friends and Family: Not on file  . Attends Religious Services: Not on file  . Active Member of Clubs or Organizations: Not on file  . Attends Archivist Meetings: Not on file  . Marital Status: Not on file  Intimate Partner Violence:   . Fear of Current or Ex-Partner: Not on file  . Emotionally Abused: Not on file  . Physically Abused: Not on file  . Sexually Abused: Not on file    Family History: History reviewed. No pertinent family history.  Review of Systems: Review of Systems  Constitutional: Negative for chills, fever and malaise/fatigue.  Respiratory: Negative for shortness of breath.   Cardiovascular: Negative for chest pain and palpitations.  Musculoskeletal: Positive for myalgias (leg pain).  Neurological: Negative for dizziness and weakness.    Physical Exam: Vital Signs There were no vitals taken for this visit. Virtual Visit  Assessment/Plan:  Patient is scheduled for excision of LLE wound with placement of integra and wound vac with Dr. Claudia Desanctis.  Risks, benefits, and alternatives of procedure discussed, questions answered.   Caprini Score: highest; Risk Factors include: hx of dvt 6 years ago, hx of cancer (active), age, length of surgery Recommendation for mechanical and pharmacological prophylaxis during surgery. Encourage early ambulation.   Post-op Rx sent to pharmacy: norco, zofran  All of her questions were answered to her satisfaction.  Recommended calling if she or family have any further questions. Patient to come to office tomorrow  to pick up surgical form. Discussed risks with patient, discussed with patient her post-op healing may be complicated by her hx of radiation to the area and her age. I discussed with patient the timeline of healing is unknown and we will have a better idea a few weeks post-op to monitor her response to the wound matrix. I discussed with her we will bring the wound vac for her. She is planning to be transported by her daughter.   Electronically signed by: Carola Rhine Rexton Greulich, PA-C 09/16/2019 2:47 PM

## 2019-09-16 NOTE — H&P (View-Only) (Signed)
Patient ID: [name], female    DOB: [birth year], 86 y.o.   MRN: [id number]  Chief Complaint  Patient presents with  . Pre-op Exam      ICD-10-CM   1. Wound of left lower extremity, initial encounter  S81.802A      History of Present Illness: [name] is a 86 y.o.  female  with a history of left leg radiation for lymphoma and subsequently developed a chronic LLE wound .  She presents for preoperative evaluation for upcoming procedure, excision of LLE wound, placement of integra and application of wound vac, scheduled for 09/26/19 with Dr. Claudia Desanctis  The patient has not had problems with anesthesia. History of DVT in the affected leg, approximately 6 years ago.  No family history of DVT/PE.  No family or personal history of bleeding or clotting disorders.  Patient is not currently taking any blood thinners.  No history of CVA/MI.  She reports she has overall been feeling well. No recent CP/SOB/Weakness, dizziness.  PMH Significant for: non hodgkin's lymphoma since 2006 with hx of chemo and radiation, aortic stenosis Cardiac clearance received for patient.  The patient gave consent to have this visit done by telemedicine / virtual visit.  This is also consent for access the chart and treat the patient via this visit. The patient is located at home.  I, the provider, am at the office.  We spent 10 minutes together for the visit.  Joined by telephone.  Past Medical History: Allergies: Allergies  Allergen Reactions  . Prednisone Other (See Comments)  . Apixaban Itching    Rash, muscle pain, and and weakness also presented  . Hydrocodone-Acetaminophen Nausea And Vomiting    Current Medications:  Current Outpatient Medications:  .  allopurinol (ZYLOPRIM) 100 MG tablet, Take 100 mg by mouth daily. (Patient not taking: Reported on 09/03/2019), Disp: , Rfl:  .  Calcium Carb-Ergocalciferol 500-200 MG-UNIT TABS, Take by mouth., Disp: , Rfl:  .  citalopram (CELEXA) 20 MG  tablet, TAKE 1 TABLET DAILY, Disp: , Rfl:  .  collagenase ([name]) ointment, , Disp: , Rfl:  .  cyanocobalamin 1000 MCG tablet, Take by mouth. (Patient not taking: Reported on 09/03/2019), Disp: , Rfl:  .  diclofenac Sodium (VOLTAREN) 1 % GEL, Apply topically twice a day as needed, Disp: , Rfl:  .  Glucosamine Sulfate 500 MG TABS, Take by mouth., Disp: , Rfl:  .  ibuprofen (ADVIL) 200 MG tablet, Take by mouth., Disp: , Rfl:  .  lidocaine-prilocaine (EMLA) cream, Apply to wound daily, Disp: , Rfl:  .  Magnesium Chloride 64 MG TABS, Take by mouth., Disp: , Rfl:  .  Multiple Vitamin (MULTI-VITAMIN) tablet, Take by mouth., Disp: , Rfl:  .  niacin 500 MG CR capsule, Take by mouth., Disp: , Rfl:  .  omeprazole (PRILOSEC) 20 MG capsule, TAKE 1 CAPSULE DAILY, Disp: , Rfl:  .  ondansetron (ZOFRAN) 8 MG tablet, Take by mouth., Disp: , Rfl:  .  potassium chloride (MICRO-K) 10 MEQ CR capsule, Take by mouth., Disp: , Rfl:  .  Probiotic CAPS, Take by mouth., Disp: , Rfl:   Past Medical Problems: History reviewed. No pertinent past medical history.  Past Surgical History: History reviewed. No pertinent surgical history.  Social History: Social History   Socioeconomic History  . Marital status: Widowed    Spouse name: Not on file  . Number of children: Not on file  . Years of education: Not on file  .  Highest education level: Not on file  Occupational History  . Not on file  Tobacco Use  . Smoking status: Never Smoker  . Smokeless tobacco: Never Used  Substance and Sexual Activity  . Alcohol use: Not on file  . Drug use: Not on file  . Sexual activity: Not on file  Other Topics Concern  . Not on file  Social History Narrative  . Not on file   Social Determinants of Health   Financial Resource Strain:   . Difficulty of Paying Living Expenses: Not on file  Food Insecurity:   . Worried About Charity fundraiser in the Last Year: Not on file  . Ran Out of Food in the Last Year: Not on  file  Transportation Needs:   . Lack of Transportation (Medical): Not on file  . Lack of Transportation (Non-Medical): Not on file  Physical Activity:   . Days of Exercise per Week: Not on file  . Minutes of Exercise per Session: Not on file  Stress:   . Feeling of Stress : Not on file  Social Connections:   . Frequency of Communication with Friends and Family: Not on file  . Frequency of Social Gatherings with Friends and Family: Not on file  . Attends Religious Services: Not on file  . Active Member of Clubs or Organizations: Not on file  . Attends Archivist Meetings: Not on file  . Marital Status: Not on file  Intimate Partner Violence:   . Fear of Current or Ex-Partner: Not on file  . Emotionally Abused: Not on file  . Physically Abused: Not on file  . Sexually Abused: Not on file    Family History: History reviewed. No pertinent family history.  Review of Systems: Review of Systems  Constitutional: Negative for chills, fever and malaise/fatigue.  Respiratory: Negative for shortness of breath.   Cardiovascular: Negative for chest pain and palpitations.  Musculoskeletal: Positive for myalgias (leg pain).  Neurological: Negative for dizziness and weakness.    Physical Exam: Vital Signs There were no vitals taken for this visit. Virtual Visit  Assessment/Plan:  Patient is scheduled for excision of LLE wound with placement of integra and wound vac with Dr. Claudia Desanctis.  Risks, benefits, and alternatives of procedure discussed, questions answered.   Caprini Score: highest; Risk Factors include: hx of dvt 6 years ago, hx of cancer (active), age, length of surgery Recommendation for mechanical and pharmacological prophylaxis during surgery. Encourage early ambulation.   Post-op Rx sent to pharmacy: norco, zofran  All of her questions were answered to her satisfaction.  Recommended calling if she or family have any further questions. Patient to come to office tomorrow  to pick up surgical form. Discussed risks with patient, discussed with patient her post-op healing may be complicated by her hx of radiation to the area and her age. I discussed with patient the timeline of healing is unknown and we will have a better idea a few weeks post-op to monitor her response to the wound matrix. I discussed with her we will bring the wound vac for her. She is planning to be transported by her daughter.   Electronically signed by: Carola Rhine Dajha Urquilla, PA-C 09/16/2019 2:47 PM

## 2019-09-19 ENCOUNTER — Encounter (HOSPITAL_BASED_OUTPATIENT_CLINIC_OR_DEPARTMENT_OTHER): Payer: Medicare Other | Attending: Internal Medicine | Admitting: Internal Medicine

## 2019-09-19 DIAGNOSIS — Z9221 Personal history of antineoplastic chemotherapy: Secondary | ICD-10-CM | POA: Diagnosis not present

## 2019-09-19 DIAGNOSIS — G629 Polyneuropathy, unspecified: Secondary | ICD-10-CM | POA: Insufficient documentation

## 2019-09-19 DIAGNOSIS — M79605 Pain in left leg: Secondary | ICD-10-CM | POA: Insufficient documentation

## 2019-09-19 DIAGNOSIS — L97223 Non-pressure chronic ulcer of left calf with necrosis of muscle: Secondary | ICD-10-CM | POA: Insufficient documentation

## 2019-09-19 DIAGNOSIS — I10 Essential (primary) hypertension: Secondary | ICD-10-CM | POA: Diagnosis not present

## 2019-09-19 DIAGNOSIS — L598 Other specified disorders of the skin and subcutaneous tissue related to radiation: Secondary | ICD-10-CM | POA: Diagnosis not present

## 2019-09-19 DIAGNOSIS — C8330 Diffuse large B-cell lymphoma, unspecified site: Secondary | ICD-10-CM | POA: Insufficient documentation

## 2019-09-19 DIAGNOSIS — R2242 Localized swelling, mass and lump, left lower limb: Secondary | ICD-10-CM | POA: Diagnosis not present

## 2019-09-19 DIAGNOSIS — Z86718 Personal history of other venous thrombosis and embolism: Secondary | ICD-10-CM | POA: Insufficient documentation

## 2019-09-19 DIAGNOSIS — M1990 Unspecified osteoarthritis, unspecified site: Secondary | ICD-10-CM | POA: Insufficient documentation

## 2019-09-23 ENCOUNTER — Other Ambulatory Visit (HOSPITAL_COMMUNITY)
Admission: RE | Admit: 2019-09-23 | Discharge: 2019-09-23 | Disposition: A | Discharge status: Home / Self Care | Payer: Medicare Other | Source: Ambulatory Visit | Attending: Plastic Surgery | Admitting: Plastic Surgery

## 2019-09-23 DIAGNOSIS — Z01812 Encounter for preprocedural laboratory examination: Secondary | ICD-10-CM | POA: Insufficient documentation

## 2019-09-23 DIAGNOSIS — Z20822 Contact with and (suspected) exposure to covid-19: Secondary | ICD-10-CM | POA: Diagnosis not present

## 2019-09-23 LAB — SARS CORONAVIRUS 2 (TAT 6-24 HRS): SARS Coronavirus 2: NEGATIVE

## 2019-09-23 NOTE — Progress Notes (Signed)
ROSELAND, BRAUN ([id number]) Visit Report for 09/19/2019 Arrival Information Details Patient Name: Date of Service: [place], [name] 09/19/2019 12:45 PM Medical Record Number: [id number] Patient Account Number: [id number] Date of Birth/Sex: Treating RN: 10/11/1929 (86 y.o. [name] Primary Care Remee Charley: PA TIENT, NO Other Clinician: Referring Trequan Marsolek: Treating Aimee Timmons/Extender: [name] in Treatment: 16 Visit Information History Since Last Visit Added or deleted any medications: No Patient Arrived: Walker Any new allergies or adverse reactions: No Arrival Time: 13:43 Had a fall or experienced change in No Accompanied By: self activities of daily living that may affect Transfer Assistance: None risk of falls: Patient Identification Verified: Yes Signs or symptoms of abuse/neglect since last visito No Secondary Verification Process Completed: Yes Hospitalized since last visit: No Patient Requires Transmission-Based Precautions: No Implantable device outside of the clinic excluding No Patient Has Alerts: No cellular tissue based products placed in the center since last visit: Has Dressing in Place as Prescribed: Yes Pain Present Now: Yes Electronic Signature(s) Signed: 09/23/2019 12:03:37 PM By: Sandre Kitty Entered By: Sandre Kitty on 09/19/2019 13:43:50 -------------------------------------------------------------------------------- Encounter Discharge Information Details Patient Name: Date of Service: [name], [name] 09/19/2019 12:45 PM Medical Record Number: [id number] Patient Account Number: [id number] Date of Birth/Sex: Treating RN: [birth year] (86 y.o. [name] Primary Care Ashlin Hidalgo: PA [name], NO Other Clinician: Referring Zoi Devine: Treating Mabel Unrein/Extender: [name] in Treatment: 16 Encounter Discharge Information Items Post Procedure Vitals Discharge Condition: Stable Temperature (F):  98.1 Ambulatory Status: Walker Pulse (bpm): 73 Discharge Destination: Home Respiratory Rate (breaths/min): 16 Transportation: Private Auto Blood Pressure (mmHg): 106/64 Accompanied By: alone Schedule Follow-up Appointment: No Clinical Summary of Care: Patient Declined Electronic Signature(s) Signed: 09/19/2019 3:22:52 PM By: Levan Hurst RN, BSN Entered By: Levan Hurst on 09/19/2019 15:22:28 -------------------------------------------------------------------------------- Lower Extremity Assessment Details Patient Name: Date of Service: [name], [name] 09/19/2019 12:45 PM Medical Record Number: [id number] Patient Account Number: [id number] Date of Birth/Sex: Treating RN: 27-Aug-1929 (86 y.o. [name] Primary Care Maguadalupe Lata: PA [name], [name] Other Clinician: Referring Rion Catala: Treating Ajdin Macke/Extender: [name] in Treatment: 16 Edema Assessment Assessed: [Left: No] [Right: No] Edema: [Left: Ye] [Right: s] Calf Left: Right: Point of Measurement: cm From Medial Instep 34 cm cm Ankle Left: Right: Point of Measurement: cm From Medial Instep 19 cm cm Vascular Assessment Pulses: Dorsalis Pedis Palpable: [Left:Yes] Electronic Signature(s) Signed: 09/19/2019 3:13:35 PM By: Kela Millin Entered By: Kela Millin on 09/19/2019 13:55:30 -------------------------------------------------------------------------------- Multi Wound Chart Details Patient Name: Date of Service: [name], [name] 09/19/2019 12:45 PM Medical Record Number: [id number] Patient Account Number: [id number] Date of Birth/Sex: Treating RN: 1929-10-18 (86 y.o. 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] [N/A:N/A] Primary Etiology: [1:Cataracts, Deep Vein Thrombosis,] [N/A:N/A] Comorbid History: [1:Hypertension, Osteoarthritis, Neuropathy, Received Chemotherapy, Received Radiation, Confinement Anxiety 10/17/2018] [N/A:N/A] Date Acquired: [1:16] [N/A:N/A] Weeks of Treatment: [1:Open] [N/A:N/A] Wound Status: [1:10x4.6x0.6] [N/A:N/A] Measurements L x W x D (cm) [1:36.128] [N/A:N/A] A (cm) : rea [1:21.677] [N/A:N/A] Volume (cm) : [1:-82.80%] [N/A:N/A] % Reduction in Area: [1:-174.30%] [N/A:N/A] % Reduction in Volume: [1:Full Thickness With Exposed Support N/A] Classification: [1:Structures Medium] [N/A:N/A] Exudate A mount: [1:Serosanguineous] [N/A:N/A] Exudate Type: [1:red,  brown] [N/A:N/A] Exudate Color: [1:Well defined, not attached] [N/A:N/A] Wound Margin: [1:Medium (34-66%)] [N/A:N/A] Granulation A mount: [1:Pink] [N/A:N/A] Granulation Quality: [1:Medium (34-66%)] [N/A:N/A] Necrotic A mount: [1:Fat Layer (Subcutaneous Tissue): Yes N/A] Exposed Structures: [1:Tendon: Yes Muscle: Yes Fascia: No Joint: No Bone: No Small (1-33%)] [N/A:N/A] Epithelialization: [1:Chemical/Enzymatic/Mechanical] [N/A:N/A] Debridement: [1:N/A] [N/A:N/A] Instrument: [1:None] [N/A:N/A] Bleeding: Debridement Treatment Response: Procedure was tolerated well [N/A:N/A] Post Debridement Measurements L x 10x4.6x0.6 [N/A:N/A] W x D (cm) [1:21.677] [N/A:N/A] Post Debridement Volume: (cm) [1:Debridement] [N/A:N/A] Treatment Notes Electronic Signature(s) Signed: 09/23/2019 7:41:19 AM By: Linton Ham MD Signed: 09/23/2019 5:24:19 PM By: Kela Millin Entered By: Linton Ham on 09/20/2019 15:54:48 -------------------------------------------------------------------------------- Multi-Disciplinary Care Plan Details Patient Name: Date of  Service: [place], [name] 09/19/2019 12:45 PM Medical Record Number: [id number] Patient Account Number: [id number] Date of Birth/Sex: Treating RN: 1929/03/25 (86 y.o. [name] Primary Care Renato Spellman: PA [name], [name] Other Clinician: Referring Paton Crum: Treating Birdena Kingma/Extender: [name] in Treatment: 16 Active Inactive Electronic Signature(s) Signed: 09/19/2019 3:13:35 PM By: Kela Millin Entered By: Kela Millin on 09/19/2019 13:58:10 -------------------------------------------------------------------------------- Pain Assessment Details Patient Name: Date of Service: [place], [name] 09/19/2019 12:45 PM Medical Record Number: [id number] Patient Account Number: [id number] Date of Birth/Sex: Treating RN: 1929-09-29 (86 y.o. [name] Primary Care Arael Piccione: PA [name], [name] Other Clinician: Referring Swayzee Wadley: Treating Chayil Gantt/Extender: [name] in Treatment: 16 Active Problems Location of Pain Severity and Description of Pain Patient Has Paino Yes Site Locations Rate the pain. Current Pain Level: 5 Pain Management and Medication Current Pain Management: Electronic Signature(s) Signed: 09/19/2019 3:13:35 PM By: Kela Millin Signed: 09/23/2019 12:03:37 PM By: Sandre Kitty Entered By: Sandre Kitty on 09/19/2019 13:44:19 -------------------------------------------------------------------------------- Wound Assessment Details Patient Name: Date of Service: [name], [name] 09/19/2019 12:45 PM Medical Record Number: [id number] Patient Account Number: [id number] Date of Birth/Sex: Treating RN: [birth year] (86 y.o. [name] Primary Care Kayan Blissett: PA TIENT, [name] Other Clinician: Referring Jolly Bleicher: Treating Uchenna Rappaport/Extender: [name] in Treatment: 16 Wound Status Wound Number: 1 Primary Soft Tissue Radionecrosis Etiology: Wound Location: Left, Lateral Lower Leg Wound  Open Wounding Event: Bump Status: Date Acquired: 10/17/2018 Comorbid Cataracts, Deep Vein Thrombosis, Hypertension, Osteoarthritis, Weeks Of Treatment: 16 History: Neuropathy, Received Chemotherapy, Received Radiation, Clustered Wound: No Confinement Anxiety Wound Measurements Length: (cm) 10 Width: (cm) 4.6 Depth: (cm) 0.6 Area: (cm) 36.128 Volume: (cm) 21.677 % Reduction in Area: -82.8% % Reduction in Volume: -174.3% Epithelialization: Small (1-33%) Tunneling: No Undermining: No Wound Description Classification: Full Thickness With Exposed Support Structures Wound Margin: Well defined, not attached Exudate Amount: Medium Exudate Type: Serosanguineous Exudate Color: red, brown Foul Odor After Cleansing: No Slough/Fibrino Yes Wound Bed Granulation Amount: Medium (34-66%) Exposed Structure Granulation Quality: Pink Fascia Exposed: No Necrotic Amount: Medium (34-66%) Fat Layer (Subcutaneous Tissue) Exposed: Yes Necrotic Quality: Adherent Slough Tendon Exposed: Yes Muscle Exposed: Yes Necrosis of Muscle: Yes Joint Exposed: No Bone Exposed: No Electronic Signature(s) Signed: 09/19/2019 3:13:35 PM By: Kela Millin Entered By: Kela Millin on 09/19/2019 13:56:12 -------------------------------------------------------------------------------- Vitals Details Patient Name: Date of Service: [name], [name] 09/19/2019 12:45 PM Medical Record Number: [id number] Patient Account Number: [id number] Date of Birth/Sex: Treating RN: [birth year] (86 y.o. [name] Primary Care Shemica Meath: PA TIENT, NO Other Clinician: Referring Tameshia Bonneville: Treating Algis Lehenbauer/Extender: [name] in Treatment: 16 Vital Signs Time Taken: 13:43 Temperature (F): 98.1 Height (in): 65 Pulse (bpm): 73 Weight (lbs): 138 Respiratory Rate (breaths/min): 16 Body Mass Index (BMI): 23 Blood  Pressure (mmHg): 106/64 Reference Range: 80 - 120 mg / dl Electronic  Signature(s) Signed: 09/23/2019 12:03:37 PM By: Sandre Kitty Entered By: Sandre Kitty on 09/19/2019 13:44:13

## 2019-09-23 NOTE — Progress Notes (Signed)
HILDA, RYNDERS ([id number]) Visit Report for 09/19/2019 Debridement Details Patient Name: Date of Service: [place], [name] 09/19/2019 12:45 PM Medical Record Number: [id number] Patient Account Number: [id number] Date of Birth/Sex: Treating RN: 08/03/1929 (86 y.o. [name] Primary Care Provider: PA [name], [name] Other Clinician: Referring Provider: Treating Provider/Extender: [name] in Treatment: 16 Debridement Performed for Assessment: Wound #1 Left,Lateral Lower Leg Performed By: Physician Ricard Dillon., MD Debridement Type: Chemical/Enzymatic/Mechanical Agent Used: [name] Level of Consciousness (Pre-procedure): Awake and Alert Pre-procedure Verification/Time Out No Taken: Bleeding: None Response to Treatment: Procedure was tolerated well Level of Consciousness (Post- Awake and Alert procedure): Post Debridement Measurements of Total Wound Length: (cm) 10 Width: (cm) 4.6 Depth: (cm) 0.6 Volume: (cm) 21.677 Character of Wound/Ulcer Post Debridement: Improved Post Procedure Diagnosis Same as Pre-procedure Electronic Signature(s) Signed: 09/19/2019 3:13:35 PM By: Kela Millin Signed: 09/23/2019 7:41:19 AM By: Linton Ham MD Entered By: Kela Millin on 09/19/2019 13:59:04 -------------------------------------------------------------------------------- HPI Details Patient Name: Date of Service: [name], [name] 09/19/2019 12:45 PM Medical Record Number: [id number] Patient Account Number: [id number] Date of Birth/Sex: Treating RN: 02/09/1929 (86 y.o. [name] Primary Care Provider: PA [name], [name] Other Clinician: Referring Provider: Treating Provider/Extender: [name] in Treatment: 16 History of Present Illness HPI Description: ADMISSION 05/27/2019 This is an 86 year old woman who came here for consideration of hyperbaric oxygen. She has a very complicated history. She is been treated for diffuse  large B-cell lymphoma since 2006. She has had 2 recurrences of this. Her most recent chemotherapy round was Treanda plus polatuzumab which ended I believe in February. She is not currently on chemotherapy. She came to see [place] with a large wound on the left lateral lower leg. Apparently this started as a lump sometime in October 2020. She received courses of antibiotics which did not help. She then had a biopsy and it actually showed lymphoma. The wound never really healed. And she developed a progressive ulcerated tumor. She underwent a course of radiation in late October into November 2020. This resulted in some improvement in the mass but she has been left with a deep punched out wound with exposed tendon. She was seen by Dr. Jaclynn Guarneri at the wound care center in [facility]. She considered her for operative debridement and/or consideration of hyperbaric oxygen. She was also seen in April on 05/07/2019 by Dr. Zigmund Daniel at the wound care center in [facility]. Arterial and venous reflux studies were ordered but I am unable to see these results. 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 She has been using [name] for a prolonged period of time for perhaps 5 weeks with some improvement in the wound surface. She has not had the wound rebiopsied but she did have a PET scan on 03/18/2027 that did show residual infiltrating soft tissue density involving the left lower calf but without discrete measurable mass and minimal uptake. 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 At the same time there was no new areas of metabolically active lymphoma identified. She therefore has not been on any ongoing chemotherapy. She also  had a CT scan of the left lower extremity on 05/21/2019 that did not show any fracture  or dislocation of the left tibia or fibula large soft tissue ulcer overlying the distal tibia no evidence of bony erosion or sclerosis to suggest osteomyelitis. 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 , The patient has had prior recent arterial studies we did not do an ABI in this clinic today. 5/18; wound looks about the same. Certainly no improvement in condition of the wound bed. She has been using [name] change daily. Deep necrotic wound on the left lateral lower calf 6/1; absolutely no change in this wound. Completely nonviable surface with tendon in the middle. She ran out of [name] last week in my absence and has been using what sounds like Vache solution. I will refax the [name] to [facility] I have her echocardiogram report. Her valve area is 1.25 mild to moderate AS. I think this is slightly worse than 3 years ago but this should not preclude her treatment with hyperbarics. I managed to creep in Dr. Kennon Holter interaction with his own nurse with regards to this question and I think he is approved that. I will text him just to make sure 6/15; some improvement in the wound surface using [name] with daily dressing changes. She is washing this off daily. Patient started hyperbarics today. 6/29; patient seen today in conjunction with HBO. She is doing dive #11 today. 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 She is midway through her course. 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 She has been using [name]. Making excellent improvement. She is tolerating HBO well 7/27; P patient seen in conjunction with HBO. 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 We have been  using [name]. Continued improvement. She is tolerating HBO well. 08/29/19-Patient seen in conjunction with HBO, unfortunately this wound has become larger and has an area of necrosis at the inferior part of the wound with slough. Patient also has increased pain to the wound. She is 40 out of 42 HBO treatments at this time. She does have an appointment set up with general surgery to look at this wound. 8/20; the last time I saw this patient before I left on vacation we had a healthy looking wound surface that did not require debridement and had much more vibrant tissue. I changed her to [facility]. By the time she was seen the next time while I was away she had developed a considerably worse situation with almost 2 cm worsening in length with worsening of the wound inferiorly. Also the surface of the wound completely back to a nonviable surface. The patient blames the [facility] but I have never seen Hydrofera Blue do anything like this sHe went to see Dr. Claudia Desanctis of plastic surgery at our request. He talked about taking her to the OR and implying Integra with a wound VAC followed by a skin graft at a second stage if it takes. He gave her a less than 1 month timeframe for follow-up. She also has a repeat PET scan next week I understand 9/3; the patient complains of a lot of pain in this wound. She is apparently had a PET scan although I can't see the results in [place] everywhere. 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 Continues to use [name]. Again she states that Dr. pace is planning to take her to the OR for operative treatment and o integrin next week. Electronic Signature(s) Signed: 09/23/2019 7:41:19 AM By: Linton Ham MD Entered By: Linton Ham on 09/20/2019  16:09:21 -------------------------------------------------------------------------------- Physical Exam Details Patient Name: Date of Service: [name], [name] 09/19/2019 12:45 PM Medical Record Number: [id number] Patient Account Number: [id number] Date of Birth/Sex: Treating RN: 07-Jan-1930 (86 y.o. [name] Primary Care Provider: PA [name], [name] Other Clinician: Referring Provider: Treating Provider/Extender: [name] in Treatment: 16 Constitutional Sitting or standing Blood Pressure is within target range for patient.. Pulse regular and within target range for patient.Marland Kitchen Respirations regular, non-labored and within target range.. Temperature is normal and within the target range for the patient.Marland Kitchen Appears in no distress. Cardiovascular Pedal pulses palpable and strong bilaterally.. Integumentary (Hair, Skin) no erythema around the wound. Notes wound exam; left lower extremity wound is worse. Dimensions about the same however surface of the wound has deteriorated. In my mind there is no doubt that she responded to hyperbarics. This was quite a bit better than it is now. In the inferior one third of the wound again she has been very adherent to the general area is much better any form of debridement. Even washing this off exceptionally Electronic Signature(s) Signed: 09/23/2019 7:41:19 AM By: Linton Ham MD Signed: 09/23/2019 7:41:19 AM By: Linton Ham MD Entered By: Linton Ham on 09/20/2019 16:19:33 -------------------------------------------------------------------------------- Physician Orders Details Patient Name: Date of Service: [place], [name] 09/19/2019 12:45 PM Medical Record Number: [id number] Patient Account Number: [id number] Date of Birth/Sex: Treating RN: 15-Sep-1929 (86 y.o. [name] Primary Care Provider: PA [name], [name] Other Clinician: Referring Provider: Treating Provider/Extender: [name] in Treatment:  16 Verbal / Phone Orders: No Diagnosis Coding Discharge From [facility] Services Discharge from Wound Care Center - Dr. Claudia Desanctis will take over. Continue with dressing until your surgery. Dressing Change Frequency Wound #1 Left,Lateral Lower Leg Change Dressing every other day. Wound Cleansing Wound #1 Left,Lateral Lower Leg May shower and wash wound with soap and water. - on days that dressing is changed Primary Wound Dressing Wound #1 Left,Lateral Lower Leg [name] Ointment - lightly moistened saline gauze over santyl Secondary Dressing Wound #1 Left,Lateral Lower Leg Kerlix/Rolled Gauze - secure with tape Dry Gauze Electronic Signature(s) Signed: 09/19/2019 3:13:35 PM By: Kela Millin Signed: 09/23/2019 7:41:19 AM By: Linton Ham MD Entered By: Kela Millin on 09/19/2019 13:57:36 -------------------------------------------------------------------------------- Problem List Details Patient Name: Date of Service: [place], [name] 09/19/2019 12:45 PM Medical Record Number: [id number] Patient Account Number: [id number] Date of Birth/Sex: Treating RN: Mar 12, 1929 (86 y.o. 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 09/19/2019 12:45 PM Medical Record Number: [id number] Patient Account Number: [id number] Date of Birth/Sex: Treating RN: 1929/04/04 (  86 y.o. [name] Primary Care Provider: PA [name], [name] Other Clinician: Referring Provider: Treating Provider/Extender: [name] in Treatment: 16 Subjective History of Present Illness (HPI) ADMISSION 05/27/2019 This is an 86 year old woman who came here for consideration of hyperbaric oxygen. She has a very complicated history. She is been treated for diffuse large B-cell lymphoma since 2006. She has had 2 recurrences of this. Her most recent chemotherapy round was Treanda plus polatuzumab which ended I believe in February. She is not currently on chemotherapy. She came to see [place] with a large wound on the left lateral lower leg. Apparently this started as a lump sometime in October 2020. She received courses of antibiotics which did not help. She then had a biopsy and it actually showed lymphoma. The wound never really healed. And she developed a progressive ulcerated tumor. She underwent a course of radiation in late October into November 2020. This resulted in some improvement in the mass but she has been left with a deep punched out wound with exposed tendon. She was seen by Dr. Jaclynn Guarneri at the wound care center in [facility]. She considered her for operative debridement and/or consideration of hyperbaric oxygen. She was also seen in April on 05/07/2019 by Dr. Zigmund Daniel at the wound care center in [facility]. Arterial and venous reflux studies were ordered but I am unable to see these results. I think preparations were being made for hyperbaric oxygen therapy but the patient lives in [place] and so she arrives here for consideration of hyperbaric oxygen for soft tissue radiation injury Other factors in this is that she does not tolerate compression because of pain. She has been using [name] for a prolonged period of time for perhaps  5 weeks with some improvement in the wound surface. She has not had the wound rebiopsied but she did have a PET scan on 03/18/2027 that did show residual infiltrating soft tissue density involving the left lower calf but without discrete measurable mass and minimal uptake. 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 At the same time there was no new areas of metabolically active lymphoma identified. She therefore has not been on any ongoing chemotherapy. She also had a CT scan of the left lower extremity on 05/21/2019 that did not show any fracture or dislocation of the left tibia or fibula large soft tissue ulcer overlying the distal tibia no evidence of bony erosion or sclerosis to suggest osteomyelitis. 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 , The patient has had prior recent arterial studies we did not do an ABI in this clinic today. 5/18; wound looks about the same. Certainly no improvement in condition of the wound bed. She has been using [name] change daily. Deep necrotic wound on the left lateral lower calf 6/1; absolutely no change in this wound. Completely nonviable surface with tendon in the middle. She ran out of [name] last week in my absence and has been using what sounds like Vache solution. I will refax the [name] to [facility] I have her echocardiogram report. Her valve area is 1.25 mild to moderate AS. I think this is slightly worse than 3 years ago but this should not preclude her treatment with hyperbarics. I managed to creep in Dr. Kennon Holter interaction with his own nurse with regards to this question and I think  he is approved that. I will text him just to make sure 6/15; some improvement in the wound surface using [name] with daily dressing changes. She is washing this off daily. Patient started  hyperbarics today. 6/29; patient seen today in conjunction with HBO. She is doing dive #11 today. 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 She is midway through her course. 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 She has been using [name]. Making excellent improvement. She is tolerating HBO well 7/27; P patient seen in conjunction with HBO. 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 We have been using [name]. Continued improvement. She is tolerating HBO well. 08/29/19-Patient seen in conjunction with HBO, unfortunately this wound has become larger and has an area of necrosis at the inferior part of the wound with slough. Patient also has increased pain to the wound. She is 40 out of 42 HBO treatments at this time. She does have an appointment set up with general surgery to look at this wound. 8/20; the last time I saw this patient before I left on vacation we had a healthy looking wound surface that did not require debridement and had much more vibrant tissue. I changed her to [facility]. By the time she was seen the next time while I was away she had developed a considerably worse situation with almost 2 cm worsening in length with worsening of the wound inferiorly. Also the surface of the wound completely back to a nonviable surface. The patient blames the [facility] but I have never seen Hydrofera Blue do anything like this sHe went to see Dr. Claudia Desanctis of plastic surgery at our request. He talked about taking her to the OR and implying Integra with a wound VAC followed by a skin graft at a second stage if it takes. He gave her a less than 1 month timeframe for follow-up. She also has a repeat PET scan next week I understand 9/3; the patient complains of a lot of pain in this wound. She is apparently had a PET scan although I can't see the results in [place]  everywhere. 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 Continues to use [name]. Again she states that Dr. pace is planning to take her to the OR for operative treatment and o integrin next week. Objective Constitutional Sitting or standing Blood Pressure is within target range for patient.. Pulse regular and within target range for patient.Marland Kitchen Respirations regular, non-labored and within target range.. Temperature is normal and within the target range for the patient.Marland Kitchen Appears in no distress. Vitals Time Taken: 1:43 PM, Height: 65 in, Weight: 138 lbs, BMI: 23, Temperature: 98.1 F, Pulse: 73 bpm, Respiratory Rate: 16 breaths/min, Blood Pressure: 106/64 mmHg. Cardiovascular Pedal pulses palpable and strong bilaterally.. General Notes: wound exam; left lower extremity wound is worse. Dimensions about the same however surface of the wound has deteriorated. In my mind there is no doubt that she responded to hyperbarics. This was quite a bit better than it is now. In the inferior one third of the wound again she has been very adherent to the general area is much better any form of debridement. Even washing this off exceptionally Integumentary (Hair,  Skin) no erythema around the wound. Wound #1 status is Open. Original cause of wound was Bump. The wound is located on the Left,Lateral Lower Leg. The wound measures 10cm length x 4.6cm width x 0.6cm depth; 36.128cm^2 area and 21.677cm^3 volume. There is muscle, tendon, and Fat Layer (Subcutaneous Tissue) exposed. There is no tunneling or undermining noted. There is a medium amount of serosanguineous drainage noted. The wound margin is well defined and not attached to the wound base. There is medium (34-66%) pink granulation within the wound bed. There is a medium (34-66%) amount of necrotic  tissue within the wound bed including Adherent Slough and Necrosis of Muscle. 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 There was a Chemical/Enzymatic/Mechanical debridement performed by Ricard Dillon., MD.. Agent used was [name]. There was no bleeding. The procedure was tolerated well. Post Debridement Measurements: 10cm length x 4.6cm width x 0.6cm depth; 21.677cm^3 volume. Character of Wound/Ulcer Post Debridement is improved. Post procedure Diagnosis Wound #1: Same as Pre-Procedure Plan Discharge From [facility]: Discharge from Wound Care Center - Dr. Claudia Desanctis will take over. Continue with dressing until your surgery. Dressing Change Frequency: Wound #1 Left,Lateral Lower Leg: Change Dressing every other day. Wound Cleansing: Wound #1 Left,Lateral Lower Leg: May shower and wash wound with soap and water. - on days that dressing is changed Primary Wound Dressing: Wound #1 Left,Lateral Lower Leg: [name] Ointment - lightly moistened saline gauze over santyl Secondary Dressing: Wound #1 Left,Lateral Lower Leg: Kerlix/Rolled Gauze - secure with tape Dry Gauze #1 as the patient is A Integra placement next week there seems little reason to make another appointment in our clinic at this point. I am however somewhat doubtful that anything is going to be helpful here. This is a radiation-induced issue. Did get better with hyperbaric oxygen at one point I felt quite confident that an advanced treatments/skin product would be helpful however it is now all reverted. #2 the patient states that her recent PET scan did not show any recurrent lymphoma although it does not seem this  result #3 we will discharge her from the wound care center at this point in a nonhealed state Electronic Signature(s) Signed: 09/23/2019 7:41:19 AM By: Linton Ham MD Entered By: Linton Ham on 09/20/2019 16:23:12 -------------------------------------------------------------------------------- SuperBill Details Patient Name: Date of Service: [name], [name] 09/19/2019 Medical Record Number: [id number] Patient Account Number: [id number] Date of Birth/Sex: Treating RN: 1930-01-03 (86 y.o. [name] Primary Care Provider: PA [name], [name] Other Clinician: Referring Provider: Treating Provider/Extender: [name] in Treatment: 16 Diagnosis Coding ICD-10 Codes Code Description [phone] Non-pressure chronic ulcer of left calf with necrosis of muscle L59.9 Disorder of the skin and subcutaneous tissue related to radiation, unspecified C83.30 Diffuse large B-cell lymphoma, unspecified site Facility Procedures CPT4 Code: [id number] Description: [phone] - DEBRIDE W/O ANES NON SELECT Modifier: Quantity: 1 Physician Procedures : CPT4 Code Description Modifier [id number] - WC PHYS LEVEL 3 - EST PT ICD-10 Diagnosis Description 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

## 2019-09-25 ENCOUNTER — Other Ambulatory Visit: Payer: Self-pay

## 2019-09-25 ENCOUNTER — Encounter (HOSPITAL_COMMUNITY): Payer: Self-pay | Admitting: Plastic Surgery

## 2019-09-25 NOTE — Anesthesia Preprocedure Evaluation (Addendum)
Anesthesia Evaluation  Patient identified by MRN, date of birth, ID band Patient awake    Reviewed: Patient's Chart, lab work & pertinent test results  History of Anesthesia Complications (+) PONV and history of anesthetic complications  Airway Mallampati: II  TM Distance: >3 FB Neck ROM: Full    Dental no notable dental hx.    Pulmonary    Pulmonary exam normal        Cardiovascular hypertension, + Peripheral Vascular Disease   Rhythm:Regular Rate:Normal     Neuro/Psych    GI/Hepatic Neg liver ROS, GERD  Medicated,  Endo/Other  negative endocrine ROS  Renal/GU negative Renal ROS     Musculoskeletal   Abdominal Normal abdominal exam  (+)   Peds  Hematology lymphoma   Anesthesia Other Findings   Reproductive/Obstetrics                          Anesthesia Physical Anesthesia Plan  ASA: II  Anesthesia Plan: General   Post-op Pain Management:    Induction:   PONV Risk Score and Plan: 3 and Ondansetron, Dexamethasone and TIVA  Airway Management Planned: LMA and Mask  Additional Equipment: None  Intra-op Plan:   Post-operative Plan: Extubation in OR  Informed Consent: I have reviewed the patients History and Physical, chart, labs and discussed the procedure including the risks, benefits and alternatives for the proposed anesthesia with the patient or authorized representative who has indicated his/her understanding and acceptance.       Plan Discussed with: CRNA  Anesthesia Plan Comments: (non hodgkin's lymphoma since 2006 with hx of chemo and radiation, aortic stenosis Cardiac clearance received for patient.  ECHO 05/21: IMPRESSIONS   1. Left ventricular ejection fraction, by estimation, is 60 to 65%. The  left ventricle has normal function. The left ventricle has no regional  wall motion abnormalities. There is mild left ventricular hypertrophy.  Left ventricular  diastolic parameters  are consistent with Grade I diastolic dysfunction (impaired relaxation).  Elevated left ventricular end-diastolic pressure.  2. Right ventricular systolic function is normal. The right ventricular  size is normal. There is normal pulmonary artery systolic pressure.  3. Right atrial size was moderately dilated.  4. The mitral valve is abnormal. Trivial mitral valve regurgitation.  5. Tricuspid valve regurgitation is mild to moderate.  6. The aortic valve is tricuspid. Aortic valve regurgitation is trivial.  Moderate aortic valve stenosis. Aortic valve area, by VTI measures 1.25  cm. Aortic valve mean gradient measures 25.4 mmHg. Aortic valve Vmax  measures 3.23 m/s.  7. The inferior vena cava is normal in size with greater than 50%  respiratory variability, suggesting right atrial pressure of 3 mmHg. )      Anesthesia Quick Evaluation

## 2019-09-25 NOTE — Progress Notes (Signed)
Spoke with pt for pre-op call. Pt denies cardiac history, but has seen Dr. Gwenlyn Found in May, 2021 for possible heart murmur. Echo done 06/10/19. Cardiac clearance noted in Epic on 09/12/19. Pt denies any chest pain or sob. Pt states she is not diabetic.  Covid test done 09/23/19 and it's negative. Pt states she has been in quarantine since the test was done and understands that she stays in quarantine until she comes to the hospital tomorrow.

## 2019-09-26 ENCOUNTER — Ambulatory Visit (HOSPITAL_COMMUNITY): Payer: Medicare Other | Admitting: Anesthesiology

## 2019-09-26 ENCOUNTER — Other Ambulatory Visit: Payer: Self-pay | Admitting: Surgical

## 2019-09-26 ENCOUNTER — Encounter (HOSPITAL_COMMUNITY): Payer: Self-pay | Admitting: Plastic Surgery

## 2019-09-26 ENCOUNTER — Ambulatory Visit (HOSPITAL_COMMUNITY)
Admission: RE | Admit: 2019-09-26 | Discharge: 2019-09-26 | Disposition: A | Discharge status: Home / Self Care | Payer: Medicare Other | Attending: Plastic Surgery | Admitting: Plastic Surgery

## 2019-09-26 ENCOUNTER — Encounter (HOSPITAL_COMMUNITY)
Admission: RE | Disposition: A | Discharge status: Home / Self Care | Payer: Self-pay | Source: Home / Self Care | Attending: Plastic Surgery

## 2019-09-26 ENCOUNTER — Telehealth: Payer: Self-pay | Admitting: Plastic Surgery

## 2019-09-26 DIAGNOSIS — Z86718 Personal history of other venous thrombosis and embolism: Secondary | ICD-10-CM | POA: Insufficient documentation

## 2019-09-26 DIAGNOSIS — I10 Essential (primary) hypertension: Secondary | ICD-10-CM | POA: Insufficient documentation

## 2019-09-26 DIAGNOSIS — Z923 Personal history of irradiation: Secondary | ICD-10-CM | POA: Insufficient documentation

## 2019-09-26 DIAGNOSIS — Z79899 Other long term (current) drug therapy: Secondary | ICD-10-CM | POA: Diagnosis not present

## 2019-09-26 DIAGNOSIS — I739 Peripheral vascular disease, unspecified: Secondary | ICD-10-CM | POA: Diagnosis not present

## 2019-09-26 DIAGNOSIS — Z9221 Personal history of antineoplastic chemotherapy: Secondary | ICD-10-CM | POA: Diagnosis not present

## 2019-09-26 DIAGNOSIS — Y842 Radiological procedure and radiotherapy as the cause of abnormal reaction of the patient, or of later complication, without mention of misadventure at the time of the procedure: Secondary | ICD-10-CM | POA: Diagnosis not present

## 2019-09-26 DIAGNOSIS — C8590 Non-Hodgkin lymphoma, unspecified, unspecified site: Secondary | ICD-10-CM | POA: Diagnosis not present

## 2019-09-26 DIAGNOSIS — S81802A Unspecified open wound, left lower leg, initial encounter: Secondary | ICD-10-CM | POA: Diagnosis present

## 2019-09-26 DIAGNOSIS — Z885 Allergy status to narcotic agent status: Secondary | ICD-10-CM | POA: Diagnosis not present

## 2019-09-26 DIAGNOSIS — Z888 Allergy status to other drugs, medicaments and biological substances status: Secondary | ICD-10-CM | POA: Insufficient documentation

## 2019-09-26 DIAGNOSIS — K219 Gastro-esophageal reflux disease without esophagitis: Secondary | ICD-10-CM | POA: Insufficient documentation

## 2019-09-26 HISTORY — PX: APPLICATION OF WOUND VAC: SHX5189

## 2019-09-26 HISTORY — DX: Other specified postprocedural states: Z98.890

## 2019-09-26 HISTORY — DX: Family history of other specified conditions: Z84.89

## 2019-09-26 HISTORY — DX: Gastro-esophageal reflux disease without esophagitis: K21.9

## 2019-09-26 HISTORY — DX: Irritable bowel syndrome, unspecified: K58.9

## 2019-09-26 HISTORY — DX: Peripheral vascular disease, unspecified: I73.9

## 2019-09-26 HISTORY — DX: Unspecified osteoarthritis, unspecified site: M19.90

## 2019-09-26 HISTORY — PX: INCISION AND DRAINAGE OF WOUND: SHX1803

## 2019-09-26 HISTORY — DX: Other specified postprocedural states: R11.2

## 2019-09-26 HISTORY — DX: Anemia, unspecified: D64.9

## 2019-09-26 HISTORY — PX: APPLICATION OF A-CELL OF EXTREMITY: SHX6303

## 2019-09-26 HISTORY — DX: Essential (primary) hypertension: I10

## 2019-09-26 HISTORY — DX: Malignant (primary) neoplasm, unspecified: C80.1

## 2019-09-26 LAB — BASIC METABOLIC PANEL
Anion gap: 9 (ref 5–15)
BUN: 29 mg/dL — ABNORMAL HIGH (ref 8–23)
CO2: 24 mmol/L (ref 22–32)
Calcium: 8.8 mg/dL — ABNORMAL LOW (ref 8.9–10.3)
Chloride: 103 mmol/L (ref 98–111)
Creatinine, Ser: 1.17 mg/dL — ABNORMAL HIGH (ref 0.44–1.00)
GFR calc Af Amer: 48 mL/min — ABNORMAL LOW (ref >60)
GFR calc non Af Amer: 42 mL/min — ABNORMAL LOW (ref >60)
Glucose, Bld: 82 mg/dL (ref 70–99)
Potassium: 4.8 mmol/L (ref 3.5–5.1)
Sodium: 136 mmol/L (ref 135–145)

## 2019-09-26 LAB — CBC
HCT: 32.3 % — ABNORMAL LOW (ref 36.0–46.0)
Hemoglobin: 10.4 g/dL — ABNORMAL LOW (ref 12.0–15.0)
MCH: 32.8 pg (ref 26.0–34.0)
MCHC: 32.2 g/dL (ref 30.0–36.0)
MCV: 101.9 fL — ABNORMAL HIGH (ref 80.0–100.0)
Platelets: 318 10*3/uL (ref 150–400)
RBC: 3.17 MIL/uL — ABNORMAL LOW (ref 3.87–5.11)
RDW: 14.3 % (ref 11.5–15.5)
WBC: 6.3 10*3/uL (ref 4.0–10.5)
nRBC: 0 % (ref 0.0–0.2)

## 2019-09-26 SURGERY — IRRIGATION AND DEBRIDEMENT WOUND
Anesthesia: General | Site: Leg Lower | Laterality: Left

## 2019-09-26 MED ORDER — LIDOCAINE 2% (20 MG/ML) 5 ML SYRINGE
INTRAMUSCULAR | Status: DC | PRN
  Administered 2019-09-26: 40 mg via INTRAVENOUS

## 2019-09-26 MED ORDER — MEPERIDINE HCL 25 MG/ML IJ SOLN: 6.2500 mg | INTRAMUSCULAR | Status: DC | PRN

## 2019-09-26 MED ORDER — DEXAMETHASONE SODIUM PHOSPHATE 10 MG/ML IJ SOLN
INTRAMUSCULAR | Status: DC | PRN
  Administered 2019-09-26: 10 mg via INTRAVENOUS

## 2019-09-26 MED ORDER — 0.9 % SODIUM CHLORIDE (POUR BTL) OPTIME
TOPICAL | Status: DC | PRN
  Administered 2019-09-26: 1000 mL

## 2019-09-26 MED ORDER — FENTANYL CITRATE (PF) 250 MCG/5ML IJ SOLN
INTRAMUSCULAR | Status: AC
  Filled 2019-09-26: qty 5

## 2019-09-26 MED ORDER — CHLORHEXIDINE GLUCONATE 0.12 % MT SOLN
15.0000 mL | Freq: Once | OROMUCOSAL | Status: AC
  Administered 2019-09-26: 15 mL via OROMUCOSAL
  Filled 2019-09-26: qty 15

## 2019-09-26 MED ORDER — LACTATED RINGERS IV SOLN: INTRAVENOUS | Status: DC

## 2019-09-26 MED ORDER — FENTANYL CITRATE (PF) 100 MCG/2ML IJ SOLN
25.0000 ug | INTRAMUSCULAR | Status: DC | PRN
  Administered 2019-09-26: 25 ug via INTRAVENOUS

## 2019-09-26 MED ORDER — PROPOFOL 500 MG/50ML IV EMUL
INTRAVENOUS | Status: DC | PRN
  Administered 2019-09-26: 100 ug/kg/min via INTRAVENOUS

## 2019-09-26 MED ORDER — PROPOFOL 10 MG/ML IV BOLUS
INTRAVENOUS | Status: AC
  Filled 2019-09-26: qty 20

## 2019-09-26 MED ORDER — ONDANSETRON HCL 4 MG/2ML IJ SOLN
INTRAMUSCULAR | Status: DC | PRN
  Administered 2019-09-26: 4 mg via INTRAVENOUS

## 2019-09-26 MED ORDER — FENTANYL CITRATE (PF) 100 MCG/2ML IJ SOLN
INTRAMUSCULAR | Status: AC
  Filled 2019-09-26: qty 2

## 2019-09-26 MED ORDER — PROPOFOL 10 MG/ML IV BOLUS
INTRAVENOUS | Status: DC | PRN
  Administered 2019-09-26: 120 mg via INTRAVENOUS

## 2019-09-26 MED ORDER — OXYCODONE HCL 5 MG/5ML PO SOLN: 5.0000 mg | Freq: Once | ORAL | Status: DC | PRN

## 2019-09-26 MED ORDER — FENTANYL CITRATE (PF) 250 MCG/5ML IJ SOLN
INTRAMUSCULAR | Status: DC | PRN
  Administered 2019-09-26: 25 ug via INTRAVENOUS
  Administered 2019-09-26: 50 ug via INTRAVENOUS
  Administered 2019-09-26 (×3): 25 ug via INTRAVENOUS

## 2019-09-26 MED ORDER — OXYCODONE HCL 5 MG PO TABS: 5.0000 mg | ORAL_TABLET | Freq: Once | ORAL | Status: DC | PRN

## 2019-09-26 MED ORDER — TRAMADOL HCL 50 MG PO TABS: 50.0000 mg | ORAL_TABLET | Freq: Four times a day (QID) | ORAL | 0 refills | Status: AC | PRN

## 2019-09-26 MED ORDER — ORAL CARE MOUTH RINSE: 15.0000 mL | Freq: Once | OROMUCOSAL | Status: AC

## 2019-09-26 MED ORDER — ONDANSETRON HCL 4 MG/2ML IJ SOLN: 4.0000 mg | Freq: Once | INTRAMUSCULAR | Status: DC | PRN

## 2019-09-26 MED ORDER — CEFAZOLIN SODIUM-DEXTROSE 2-4 GM/100ML-% IV SOLN
2.0000 g | INTRAVENOUS | Status: AC
  Administered 2019-09-26: 2 g via INTRAVENOUS
  Filled 2019-09-26: qty 100

## 2019-09-26 SURGICAL SUPPLY — 38 items
BLADE SURG 15 STRL LF DISP TIS (BLADE) ×1 IMPLANT
BLADE SURG 15 STRL SS (BLADE) ×3
BNDG ELASTIC 6X5.8 VLCR STR LF (GAUZE/BANDAGES/DRESSINGS) ×3 IMPLANT
BNDG GAUZE ELAST 4 BULKY (GAUZE/BANDAGES/DRESSINGS) ×3 IMPLANT
CANISTER SUCT 1200ML W/VALVE (MISCELLANEOUS) ×3 IMPLANT
CANISTER WOUND CARE 500ML ATS (WOUND CARE) IMPLANT
CHLORAPREP W/TINT 26 (MISCELLANEOUS) IMPLANT
COVER SURGICAL LIGHT HANDLE (MISCELLANEOUS) ×3 IMPLANT
COVER WAND RF STERILE (DRAPES) ×3 IMPLANT
DRAPE INCISE IOBAN 66X45 STRL (DRAPES) IMPLANT
DRAPE LAPAROTOMY T 98X78 PEDS (DRAPES) IMPLANT
DRSG EMULSION OIL 3X3 NADH (GAUZE/BANDAGES/DRESSINGS) IMPLANT
DRSG MEPITEL 4X7.2 (GAUZE/BANDAGES/DRESSINGS) ×3 IMPLANT
DRSG TELFA 3X8 NADH (GAUZE/BANDAGES/DRESSINGS) IMPLANT
DRSG VAC ATS LRG SENSATRAC (GAUZE/BANDAGES/DRESSINGS) IMPLANT
DRSG VAC ATS MED SENSATRAC (GAUZE/BANDAGES/DRESSINGS) IMPLANT
ELECT CAUTERY BLADE 6.4 (BLADE) ×3 IMPLANT
ELECT REM PT RETURN 9FT ADLT (ELECTROSURGICAL) ×3
ELECTRODE REM PT RTRN 9FT ADLT (ELECTROSURGICAL) ×1 IMPLANT
GAUZE SPONGE 4X4 12PLY STRL (GAUZE/BANDAGES/DRESSINGS) ×3 IMPLANT
GLOVE BIOGEL M STRL SZ7.5 (GLOVE) ×6 IMPLANT
GLOVE BIOGEL PI IND STRL 8 (GLOVE) ×1 IMPLANT
GLOVE BIOGEL PI INDICATOR 8 (GLOVE) ×2
GOWN STRL REUS W/ TWL LRG LVL3 (GOWN DISPOSABLE) ×2 IMPLANT
GOWN STRL REUS W/TWL LRG LVL3 (GOWN DISPOSABLE) ×6
HANDPIECE VERSAJET 45 DEG 8 (MISCELLANEOUS) ×3 IMPLANT
KIT BASIN OR (CUSTOM PROCEDURE TRAY) ×3 IMPLANT
KIT TURNOVER KIT A (KITS) ×3 IMPLANT
MATRIX TISSUE MESHED 4X5 (Tissue) ×3 IMPLANT
NS IRRIG 1000ML POUR BTL (IV SOLUTION) ×3 IMPLANT
PACK GENERAL/GYN (CUSTOM PROCEDURE TRAY) ×3 IMPLANT
PENCIL SMOKE EVACUATOR (MISCELLANEOUS) ×3 IMPLANT
SOLUTION BETADINE 4OZ (MISCELLANEOUS) ×3 IMPLANT
STAPLER VISISTAT 35W (STAPLE) ×3 IMPLANT
SWAB COLLECTION DEVICE MRSA (MISCELLANEOUS) IMPLANT
SYR CONTROL 10ML LL (SYRINGE) ×3 IMPLANT
TOWEL GREEN STERILE (TOWEL DISPOSABLE) ×3 IMPLANT
TOWEL GREEN STERILE FF (TOWEL DISPOSABLE) ×3 IMPLANT

## 2019-09-26 NOTE — Anesthesia Procedure Notes (Signed)
Procedure Name: LMA Insertion Date/Time: 09/26/2019 9:24 AM Performed by: Babs Bertin, CRNA Pre-anesthesia Checklist: Patient identified, Emergency Drugs available, Suction available and Patient being monitored Patient Re-evaluated:Patient Re-evaluated prior to induction Oxygen Delivery Method: Circle System Utilized Preoxygenation: Pre-oxygenation with 100% oxygen Induction Type: IV induction Ventilation: Mask ventilation without difficulty LMA: LMA inserted LMA Size: 4.0 Number of attempts: 1 Airway Equipment and Method: Bite block Placement Confirmation: positive ETCO2 Tube secured with: Tape Dental Injury: Teeth and Oropharynx as per pre-operative assessment

## 2019-09-26 NOTE — Discharge Instructions (Addendum)
Activity As tolerated: NO showers NO driving No heavy activities  Diet: Regular  Wound Care: Keep dressing clean & dry. Do not remove wound vac until your post-op follow up in 1 week. ELEVATE YOUR LEG WHILE RESTING  PAIN: Prescription for post-op pain was sent in to your pharmacy on 09/16/19 at your virtual pre-op appointment. You can also take tylenol and/or ibuprofen for post-op pain if you can tolerate these medications. Norco has 325mg  of tylenol, be sure to not take more than 3,000 mg of tylenol in a 24 hour period.  Special Instructions:  Call Doctor if any unusual problems occur such as pain, excessive Bleeding, unrelieved Nausea/vomiting, Fever &/or chills  Follow-up appointment: Scheduled for next week.

## 2019-09-26 NOTE — Op Note (Signed)
Operative Note   DATE OF OPERATION: 09/26/2019  SURGICAL DEPARTMENT: Plastic Surgery  PREOPERATIVE DIAGNOSES: Left lower extremity wound  POSTOPERATIVE DIAGNOSES:  same  PROCEDURE: 1.  Debridement of the left lower extremity wound totaling 12 x 5 cm.  This included skin subcutaneous tissue and fascia.  This was an excisional debridement. 2.  Placement of Integra bilayer to left lower extremity wound totaling 12 x 5 cm 3.  Wound VAC application left lower extremity 12 x 5 cm  SURGEON: Talmadge Coventry, MD  ASSISTANT: Verdie Shire, PA The advanced practice practitioner (APP) assisted throughout the case.  The APP was essential in retraction and counter traction when needed to make the case progress smoothly.  This retraction and assistance made it possible to see the tissue plans for the procedure.  The assistance was needed for blood control, tissue re-approximation and assisted with closure of the incision site.  ANESTHESIA:  General.   COMPLICATIONS: None.   INDICATIONS FOR PROCEDURE:  The patient, [name] is a 86 y.o. female born on [birth year], is here for treatment of left lower extremity wound.  This was due to radiation and failed conservative wound care measures. MRN: [id number]  CONSENT:  Informed consent was obtained directly from the patient. Risks, benefits and alternatives were fully discussed. Specific risks including but not limited to bleeding, infection, hematoma, seroma, scarring, pain, contracture, asymmetry, wound healing problems, and need for further surgery were all discussed. The patient did have an ample opportunity to have questions answered to satisfaction.   DESCRIPTION OF PROCEDURE:  The patient was taken to the operating room. SCDs were placed and Ancef antibiotics were given.  General anesthesia was administered.  The patient's operative site was prepped and draped in a sterile fashion. A time out was performed and all information was confirmed  to be correct.  Started by examining the wound.  There was some exposed tendons with significant fibrinous exudate.  I debrided this with the Versajet.  It would be classified as an excisional debridement.  All the surrounding tissue was fairly fibrotic and did not bleed readily.  This did expose would look like the peroneus longus tendon.  I did eventually get to the point where the majority of tissue was bleeding but not profusely.  The debridement included skin subcutaneous tissue and fascia.  I then brought a piece of Integra bilayer onto the field.  This was rinsed for several minutes.  It was then cut to size and stapled to the wound.  Mepitel layer was then placed followed by black sponge and a wound VAC which obtained a good seal.  This was then wrapped with Kerlix and a Ace wrap.  Dimensions of the wound were 12 x 5 cm.  The patient tolerated the procedure well.  There were no complications. The patient was allowed to wake from anesthesia, extubated and taken to the recovery room in satisfactory condition.

## 2019-09-26 NOTE — Progress Notes (Signed)
Post-op pain meds, patient reported she cannot tolerate norco due to allergy. Rx for tramadol sent to pharmacy.

## 2019-09-26 NOTE — Telephone Encounter (Signed)
Patient's daughter, Almyra Free, calling to get instructions on the wound vac. What is she supposed to do with it? Does she need to charge it? Please call her at [phone] to discuss.

## 2019-09-26 NOTE — Anesthesia Postprocedure Evaluation (Signed)
Anesthesia Post Note  Patient: TONY FRISCIA  Procedure(s) Performed: Debridement left leg wound (Left Leg Lower) placement of integra bilayer (Left Leg Lower) APPLICATION OF WOUND VAC (Left Leg Lower)     Patient location during evaluation: PACU Anesthesia Type: General Level of consciousness: awake and alert Pain management: pain level controlled Vital Signs Assessment: post-procedure vital signs reviewed and stable Respiratory status: spontaneous breathing, nonlabored ventilation, respiratory function stable and patient connected to nasal cannula oxygen Cardiovascular status: blood pressure returned to baseline and stable Postop Assessment: no apparent nausea or vomiting Anesthetic complications: no   No complications documented.  Last Vitals:  Vitals:   09/26/19 1100 09/26/19 1101  BP: 140/75   Pulse: 62 60  Resp: 12 13  Temp: (!) 36.1 C   SpO2: 100% 100%    Last Pain:  Vitals:   09/26/19 1101  PainSc: 0-No pain                 Belenda Cruise P Dorette Hartel

## 2019-09-26 NOTE — Brief Op Note (Signed)
09/26/2019  10:09 AM  PATIENT:  [name]  86 y.o. female  PRE-OPERATIVE DIAGNOSIS:  Plastic Surgery Wound of left lower extremity  POST-OPERATIVE DIAGNOSIS:  Plastic Surgery Wound of left lower extremity  PROCEDURE:  Procedure(s): Debridement left leg wound (Left) placement of integra bilayer (Left) APPLICATION OF WOUND VAC (Left)  SURGEON:  Surgeon(s) and Role:    * Noemy Hallmon, Steffanie Dunn, MD - Primary  PHYSICIAN ASSISTANT: Software engineer, PA  ASSISTANTS: none   ANESTHESIA:   general  EBL:  10   BLOOD ADMINISTERED:none  DRAINS: none   LOCAL MEDICATIONS USED:  NONE  SPECIMEN:  No Specimen  DISPOSITION OF SPECIMEN:  N/A  COUNTS:  YES  TOURNIQUET:  * No tourniquets in log *  DICTATION: .Dragon Dictation  PLAN OF CARE: Discharge to home after PACU  PATIENT DISPOSITION:  PACU - hemodynamically stable.   Delay start of Pharmacological VTE agent (>24hrs) due to surgical blood loss or risk of bleeding: not applicable

## 2019-09-26 NOTE — Telephone Encounter (Signed)
Spoke with Almyra Free and provided guidance and instructions.

## 2019-09-26 NOTE — Interval H&P Note (Signed)
History and Physical Interval Note:  09/26/2019 8:52 AM  [name]  has presented today for surgery, with the diagnosis of Plastic Surgery Wound of left lower extremity.  The various methods of treatment have been discussed with the patient and family. After consideration of risks, benefits and other options for treatment, the patient has consented to  Procedure(s): Debridement left leg wound (Left) placement of integra bilayer (Left) APPLICATION OF WOUND VAC (Left) as a surgical intervention.  The patient's history has been reviewed, patient examined, no change in status, stable for surgery.  I have reviewed the patient's chart and labs.  Questions were answered to the patient's satisfaction.     Cindra Presume

## 2019-09-26 NOTE — Transfer of Care (Signed)
Immediate Anesthesia Transfer of Care Note  Patient: [name]  Procedure(s) Performed: Debridement left leg wound (Left Leg Lower) placement of integra bilayer (Left Leg Lower) APPLICATION OF WOUND VAC (Left Leg Lower)  Patient Location: PACU  Anesthesia Type:General  Level of Consciousness: responds to stimulation  Airway & Oxygen Therapy: Patient Spontanous Breathing  Post-op Assessment: Report given to RN and Post -op Vital signs reviewed and stable  Post vital signs: Reviewed and stable  Last Vitals:  Vitals Value Taken Time  BP 128/70 09/26/19 1015  Temp 97   Pulse 62 09/26/19 1017  Resp 16 09/26/19 1017  SpO2 95 % 09/26/19 1017  Vitals shown include unvalidated device data.  Last Pain:  Vitals:   09/26/19 0805  PainSc: 4       Patients Stated Pain Goal: 3 (91/69/45 0388)  Complications: No complications documented.

## 2019-09-29 ENCOUNTER — Encounter (HOSPITAL_COMMUNITY): Payer: Self-pay | Admitting: Plastic Surgery

## 2019-10-02 ENCOUNTER — Ambulatory Visit (INDEPENDENT_AMBULATORY_CARE_PROVIDER_SITE_OTHER): Payer: Medicare Other | Admitting: Surgical

## 2019-10-02 ENCOUNTER — Other Ambulatory Visit: Payer: Self-pay

## 2019-10-02 ENCOUNTER — Encounter: Payer: Self-pay | Admitting: Surgical

## 2019-10-02 VITALS — BP 117/81 | HR 71 | Temp 98.5°F

## 2019-10-02 DIAGNOSIS — S81802A Unspecified open wound, left lower leg, initial encounter: Secondary | ICD-10-CM

## 2019-10-02 NOTE — Progress Notes (Signed)
   Subjective:     Patient ID: [name], female    DOB: Feb 06, 1929, 86 y.o.   MRN: [id number]  Chief Complaint  Patient presents with  . Follow-up    HPI: The patient is a 86 y.o. female here for follow-up after debridement of left leg wound placement of Integra bilayer totaling 38.4 cm, application of wound VAC on 09/26/2019.  Of note patient developed this wound after radiation of her leg for lymphoma.  Patient has been set up with home health [facility] to assist with wound VAC changes 3 times per week.  I have previously discussed with [facility] that I would like them to reapply wound VAC in 10/03/2019.  Patient reports she is overall doing well, reports some constipation with use of the pain medication.  No fevers, chills, nausea, vomiting.   Review of Systems  Constitutional: Positive for activity change. Negative for chills, fatigue and fever.  Respiratory: Negative.   Cardiovascular: Negative.   Gastrointestinal: Positive for constipation and diarrhea. Negative for nausea and vomiting.  Skin: Positive for wound.     Objective:   Vital Signs BP 117/81 (BP Location: Left Arm, Patient Position: Sitting, Cuff Size: Normal)   Pulse 71   Temp 98.5 F (36.9 C) (Oral)   SpO2 93%  Vital Signs and Nursing Note Reviewed Physical Exam Constitutional:      General: She is not in acute distress.    Appearance: She is not ill-appearing.  Cardiovascular:     Comments: 2+ DP pulse noted over left foot, no pedal edema  Musculoskeletal:        General: No swelling.     Right lower leg: No edema.     Left lower leg: No edema.  Skin:      Neurological:     Mental Status: She is oriented to person, place, and time.  Psychiatric:        Mood and Affect: Mood normal.        Behavior: Behavior normal.     Assessment/Plan:     ICD-10-CM   1. Wound of left lower extremity, initial encounter  S81.802A    Wound VAC removed, Integra in place. Xeroform gauze, 4 x 4,  ABD, Kerlix wrap applied.  Discussed with patient that [facility] would be assisting the patient with wound VAC changes.  Wound VAC changes would occur 3 times per week.  There is no sign of any infection.  I did discuss with the patient that healing this wound may take some time due to the radiation damaged tissue, however at this time everything appears to be doing well.  Recommend calling with questions or concerns. Follow up scheduled for 2 weeks for reevaluation.   Carola Rhine [place] Warriner, PA-C 10/02/2019, 2:30 PM

## 2019-10-15 ENCOUNTER — Telehealth: Payer: Self-pay | Admitting: *Deleted

## 2019-10-15 NOTE — Telephone Encounter (Signed)
Received Home Health Certification and Plan of Care on (10/15/19) via of fax from [facility].  Requesting signature.  Given to provider to sign.  Orders signed and faxed back to [facility].  Confirmation received and copy scanned into the chart.//AB/CMA

## 2019-10-17 ENCOUNTER — Telehealth: Payer: Self-pay | Admitting: Surgical

## 2019-10-17 NOTE — Telephone Encounter (Signed)
Returned [employer] from [facility]. Advise her that I spoke with Venetia Night, [name] and she suggested for patient to keep taking what she has been, until she sees [facility] for her follow up next Tues.

## 2019-10-17 NOTE — Telephone Encounter (Signed)
[name] with [facility] called to advise that patient is in a lot of pain during wound care changes. She is taking tylenol and tramadol. [name] wants to know if she can be given something else to help with pain. Patient has appt on Tuesday with [name]. Please call patient to advise.

## 2019-10-21 ENCOUNTER — Other Ambulatory Visit: Payer: Self-pay

## 2019-10-21 ENCOUNTER — Encounter: Payer: Self-pay | Admitting: Surgical

## 2019-10-21 ENCOUNTER — Ambulatory Visit (INDEPENDENT_AMBULATORY_CARE_PROVIDER_SITE_OTHER): Payer: Medicare Other | Admitting: Surgical

## 2019-10-21 VITALS — BP 118/68 | HR 77 | Temp 97.5°F | Ht 65.0 in | Wt 128.0 lb

## 2019-10-21 DIAGNOSIS — S81802D Unspecified open wound, left lower leg, subsequent encounter: Secondary | ICD-10-CM | POA: Diagnosis not present

## 2019-10-21 NOTE — Progress Notes (Signed)
Subjective:     Patient ID: [name], female    DOB: [birth year], 86 y.o.   MRN: [id number]  Chief Complaint  Patient presents with  . Follow-up    HPI: The patient is a 86 y.o. female here for follow-up after debridement of left leg wound with placement of Integra bilayer, application of wound VAC on 09/26/2019 with Dr. Claudia Desanctis.  Patient has been receiving home health assistance with wound VAC changes 3 times per week by [facility]. She reports this has been going well.  Patient reports she has been taking tramadol for pain, she mostly has pain at night and reports that the tramadol works for about 3 hours but then she has to take Tylenol. She is not having any infectious symptoms. She reports that some days the pain is better than others. She is currently undergoing radiation therapy on her adrenal glands.  Review of Systems  Constitutional: Negative for chills, fatigue and fever.  Cardiovascular: Negative for leg swelling.  Skin: Positive for wound.     Objective:   Vital Signs BP 118/68 (BP Location: Left Arm, Patient Position: Sitting, Cuff Size: Normal)   Pulse 77   Temp (!) 97.5 F (36.4 C) (Temporal)   Ht 5\' 5"  (1.651 m)   Wt 128 lb (58.1 kg)   SpO2 96%   BMI 21.30 kg/m  Vital Signs and Nursing Note Reviewed Physical Exam Constitutional:      Appearance: Normal appearance. She is not ill-appearing or toxic-appearing.     Comments: Pleasant , no acute distress  HENT:     Head: Normocephalic and atraumatic.  Pulmonary:     Effort: Pulmonary effort is normal.  Musculoskeletal:        General: No swelling.     Right lower leg: No edema.     Left lower leg: No edema.     Comments: Left leg wound is 11 x 5.5 x 0.4 cm with exposed tendon - appears to be peroneus tendon. There is a clear delineation within the wound bed showing where the tendon has been disrupting the wound bed. There is no foul odor or purulent drainage noted from the wound bed. There  is some periwound erythema, appears consistent with radiation damage and there is no cellulitic changes. The tendon is not desiccated. The wound bed has a small amount of fibrinous exudates scattered throughout without any thick areas or eschar noted. All of the Integra bilayer has incorporated and the silicone layer was removed today.  Neurological:     General: No focal deficit present.     Mental Status: She is alert.  Psychiatric:        Mood and Affect: Mood normal.        Behavior: Behavior normal.     Assessment/Plan:     ICD-10-CM   1. Wound of left lower extremity, subsequent encounter  S81.802D    Patient's tendon has become more exposed, I discussed the case with Dr. Claudia Desanctis today. We discussed referring the patient for evaluation for potential free flap to the area for coverage of exposed tendon. I discussed this with the patient and answered all of her questions in regards to the perspective plan. I discussed with the patient that I would like to see her again in 1 month for reevaluation, if she has any questions or concerns in the next month she can certainly call us and can be seen sooner if necessary. I discussed with patient that if she does  not hear from the place of referral in the next few weeks to month to call us and we can confirm referral had been received. She is not having any infectious symptoms. After discussing referral with patient, she reported that she was aware that this would be possible as she has previously discussed this with Dr. Claudia Desanctis.   I did discuss with the patient that I was not comfortable prescribing a higher dose of tramadol for pain control due to her age and fall risk. I recommend she supplement with Tylenol.  Pictures were obtained of the patient and placed in the chart with the patient's or guardian's permission.  Recommend calling with questions or concerns. Follow up scheduled for 1 month   Charlies Constable, PA-C 10/21/2019, 3:28 PM

## 2019-10-29 ENCOUNTER — Telehealth: Payer: Self-pay

## 2019-10-29 NOTE — Telephone Encounter (Signed)
Danae Chen from [facility] called regarding a mutual patient, [name].  Danae Chen states that the skin around the wound is macerated and red.  Please advise.

## 2019-10-29 NOTE — Telephone Encounter (Signed)
Returned call to [place] (ph# [phone]) regarding her concern with the pt's left/lower leg wound. Erica provides wound/ vac dressing changes/care 3x week. She describes the peri-wound tissue as "macerated/red" and unable to maintain a seal for the wound vac dressing. Danae Chen suggested that she could try to use Duoderm dressing on the peri-wound area. I consulted with Dr. Claudia Desanctis & he agrees with this plan. Danae Chen documented this as a verbal order & will obtain the items needed & will begin this dressing on her next wound vac change this week.  I also informed Danae Chen that our office has sent referral to Dr. Randolm Idol with [facility] for possible "free-flap" closure per Dr. Claudia Desanctis. She will inform the pt to expect a call from them to arrange this consultation. Erica or the pt will call for any concerns or complications & if not pt has a f/u 11/20/19 here with Venetia Night, [name]

## 2019-10-31 ENCOUNTER — Telehealth: Payer: Self-pay

## 2019-10-31 NOTE — Telephone Encounter (Signed)
Debbie called from [facility].  She is with Therese Sarah right now.  Patient has a wound vac and an exposed tendon.  When Debbie saw the patient a couple of weeks ago, she had plastic over the wound.  Now the plastic is gone and the sponge of the wound vac is sticking to the tendon.  She asked if we would like to have something in the wound bed.  I spoke with Verdie Shire, PA-C, who advised that she use Adaptic and change it each time she changes the wound vac.  I relayed the information to [place].

## 2019-11-03 ENCOUNTER — Telehealth: Payer: Self-pay

## 2019-11-03 NOTE — Telephone Encounter (Signed)
[name] called from [facility] to let us know that she saw Therese Sarah and had to use an alternate dressing (wet to dry) while waiting for wound vac supplies to come in.

## 2019-11-04 ENCOUNTER — Telehealth: Payer: Self-pay | Admitting: *Deleted

## 2019-11-04 NOTE — Telephone Encounter (Signed)
Faxed renewal order for wound vac supplies as per [place], PA (x  2 months) Orders for vac dressing changes 3x/week by Danae Chen with [facility] Fax confirmation received Copy scanned into chart

## 2019-11-04 NOTE — Telephone Encounter (Signed)
Received Physician Orders on (11/01/19) via of fax from [facility] requesting signatures for orders.  Given to provider to sign.    Orders signed and faxed back to [facility].  Confirmation received and copy scanned into the chart.//AB/CMA

## 2019-11-05 ENCOUNTER — Telehealth: Payer: Self-pay

## 2019-11-05 NOTE — Telephone Encounter (Signed)
Patient's daughter, Almyra Free called to inform us that the patient's wound vac stopped working over the weekend. She did a dressing change and noticed a grey/milky substance attached to the exposed tendon. She would like a call back to discuss treatment options.

## 2019-11-05 NOTE — Telephone Encounter (Signed)
Erica called from [facility] regarding [name].  The wound vac supplies have not arrived yet so she went ahead and applied a wet-to-dry bandage today.  [name]'s daughter is there to do the wet-to-dry bandage tomorrow if the supplies still have not arrived.  [name] is experiencing some swelling.  [facility] will be back to visit [name] on Friday and [name] thinks the wound vac supplies should be there by then.  [name] also wanted to let us know that [name]'s primary care physician, Dr. Pauletta Browns, has prescribed amoxicillin and her pain doctor has prescribed morphine.

## 2019-11-06 ENCOUNTER — Telehealth: Payer: Self-pay

## 2019-11-06 NOTE — Telephone Encounter (Signed)
Call to [place] re:  pt & homehealth RN- Erica's report that the wound vac supplies have not been delivered. Arley Phenix confirmed that the records indicate that supplies were delivered to pt's home ( at front door) at 1:30pm today. Arley Phenix will call the pt to confirm this & they will let Randall Hiss with [facility] know that she can resume wound vac dressing care on her next scheduled visit.

## 2019-11-17 ENCOUNTER — Telehealth: Payer: Self-pay | Admitting: *Deleted

## 2019-11-17 NOTE — Telephone Encounter (Signed)
Received on (11/15/19) Physician Order via of fax from [facility] requesting signature and return.  Given to provider to sign.   Orders signed and faxed back to [facility].  Confirmation received and copy scanned into the chart.//AB/CMA

## 2019-11-19 ENCOUNTER — Telehealth: Payer: Self-pay | Admitting: Plastic Surgery

## 2019-11-19 NOTE — Telephone Encounter (Signed)
[name] from [facility] called to advise that the patient had an odor coming from wound and she saw her PCP and they prescribed her augmentin. Patient has a 1-1.5 cm area that is almost blackish, appearing bruised at 9 o'clock. Patient is still not feeling well. She will advise patient to call us and make an appt for today or tomorrow to see [place] or [place].

## 2019-11-20 ENCOUNTER — Ambulatory Visit: Payer: Medicare Other | Admitting: Plastic Surgery

## 2019-11-24 ENCOUNTER — Telehealth: Payer: Self-pay

## 2019-11-24 NOTE — Telephone Encounter (Signed)
Faxed signed order from Dr. Claudia Desanctis to [facility]

## 2019-12-16 ENCOUNTER — Telehealth: Payer: Self-pay | Admitting: *Deleted

## 2019-12-16 NOTE — Telephone Encounter (Signed)
Received on (12/09/19) via of fax Physician Orders from [facility].  Requesting signature and return.  Given to provider to sign.    Orders signed and faxed to [facility].  Confirmation received and copy scanned into the chart.//AB/CMA

## 2019-12-17 ENCOUNTER — Telehealth: Payer: Self-pay | Admitting: *Deleted

## 2019-12-17 NOTE — Telephone Encounter (Signed)
On (12/17/19) via of fax received Episode Summary Report,Episode Detail Report,and Discharge-Transfer Summary Report from [facility].  Requesting Urgent Reviewed by the physician.  Given to the physician to review.    Physician reviewed information and copy scanned into the chart.//AB/CMA

## 2019-12-17 DEATH — deceased

## 2019-12-19 ENCOUNTER — Telehealth: Payer: Self-pay | Admitting: *Deleted

## 2019-12-19 NOTE — Telephone Encounter (Signed)
Received on (12/17/19) Missed Visit Report from [facility] via of fax.  For Provider to review.  Given to the provider to review.  Copy scanned into the chart.//AB/CMA

## 2020-06-11 ENCOUNTER — Other Ambulatory Visit (HOSPITAL_COMMUNITY): Payer: Medicare Other

## 2020-06-11 ENCOUNTER — Encounter (HOSPITAL_COMMUNITY): Payer: Self-pay | Admitting: Cardiovascular Disease

## 2021-08-30 IMAGING — CR DG CHEST 2V
2 series · 2 of 2 positions shown · non-contrast
Comparison: PET-CT 03/18/2019 and earlier.

CLINICAL DATA: 89-year-old female pretreatment exam for hyperbaric
chamber. Lymphoma.

EXAM:
CHEST - 2 VIEW

[w chest pa]
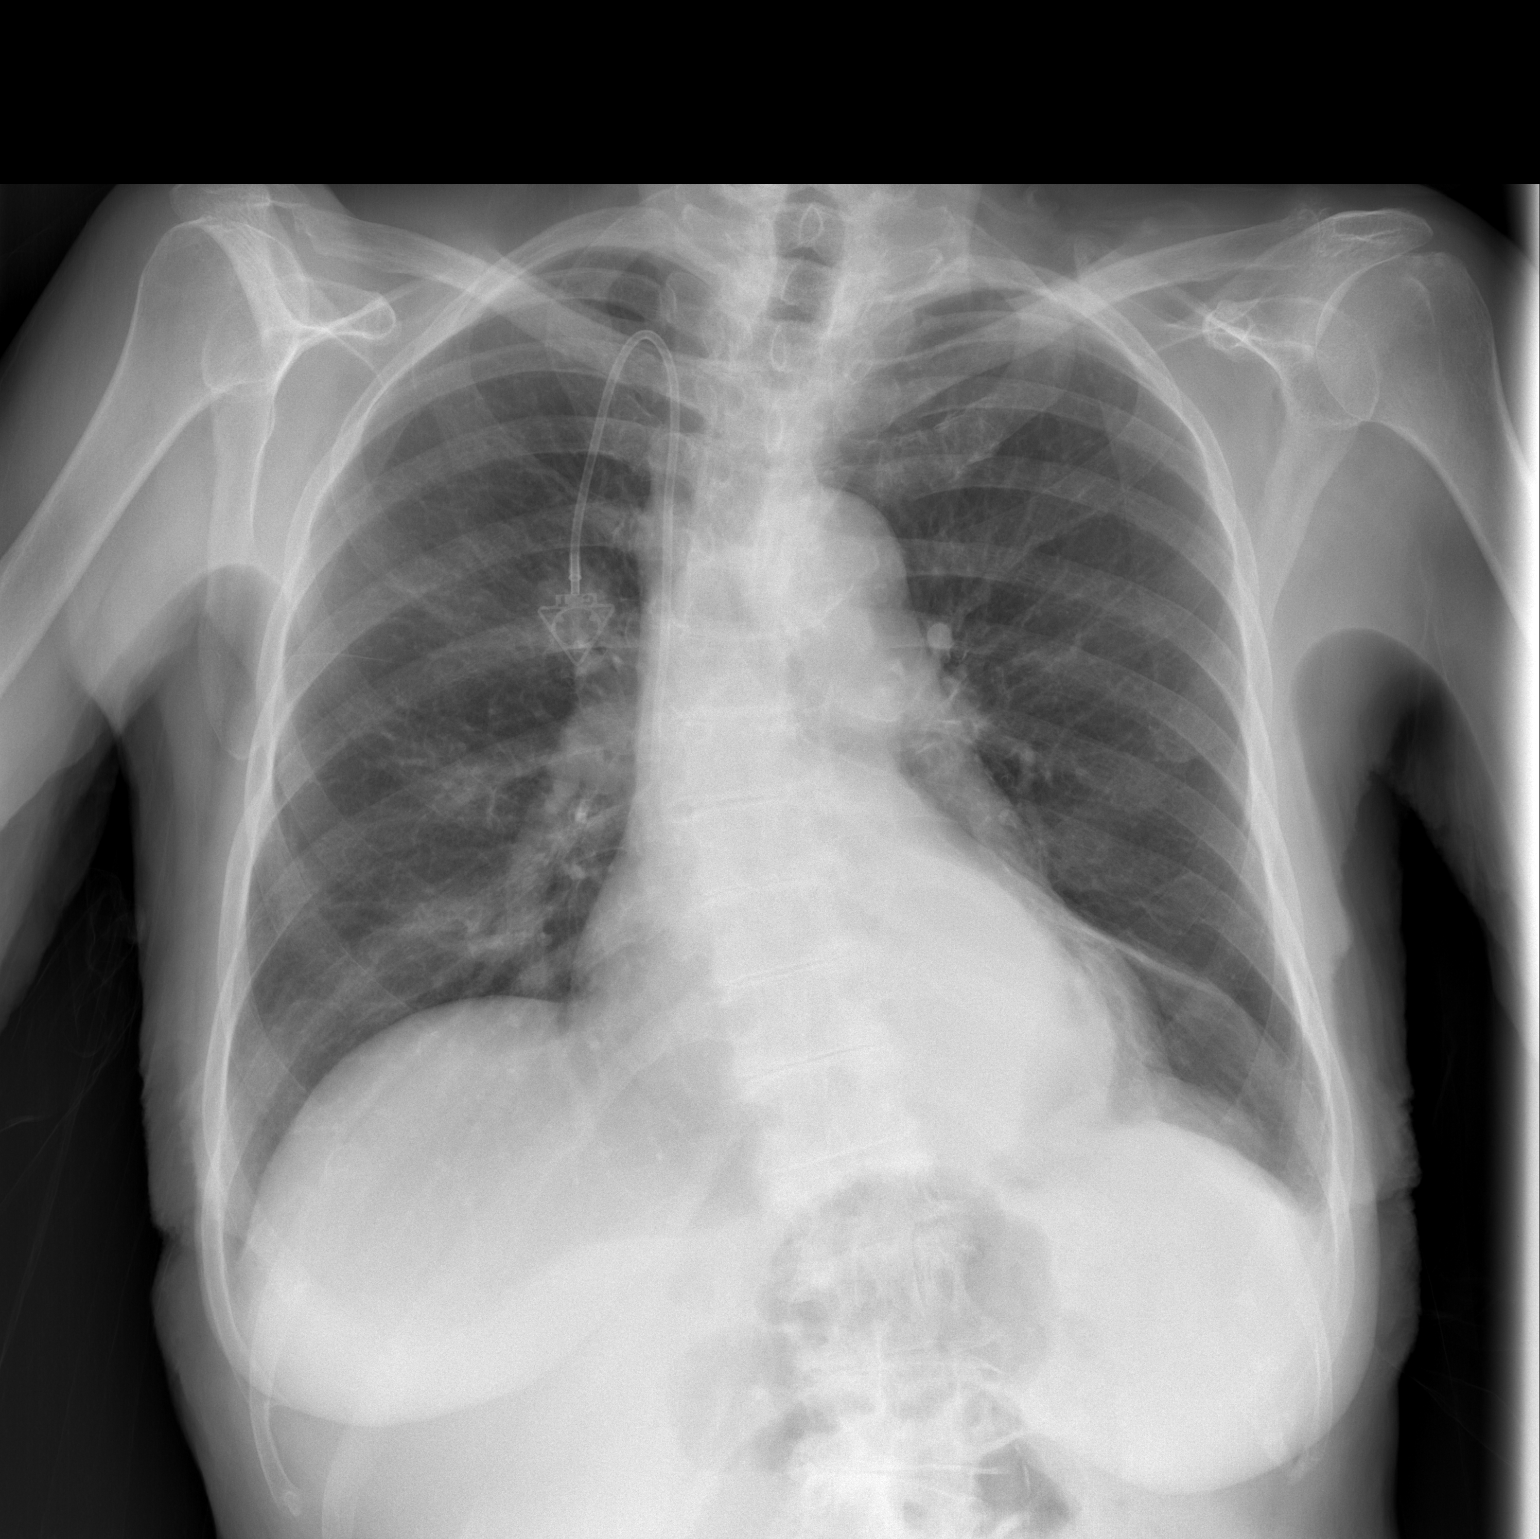

[w chest lat]
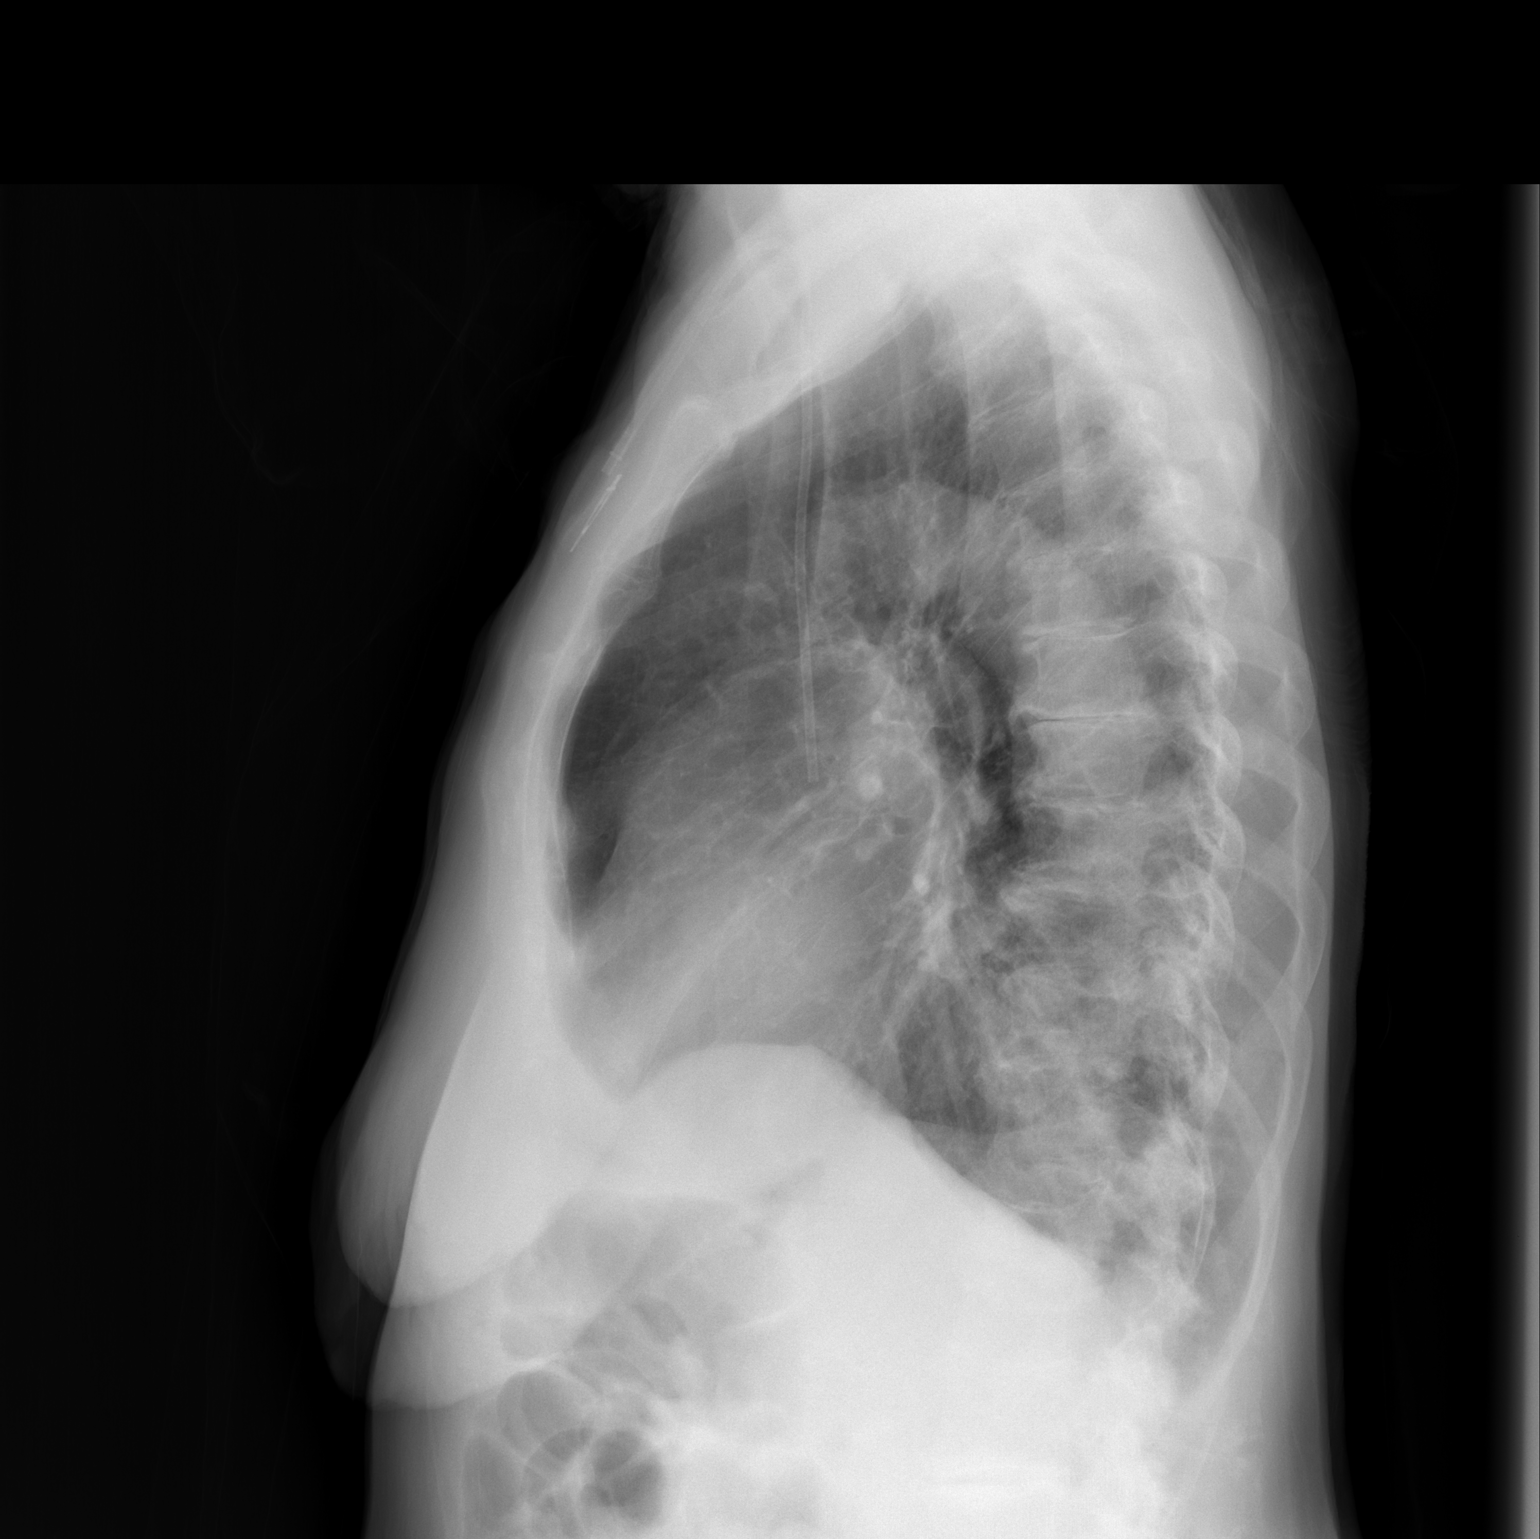

[2 of 2 positions shown; findings below may reference images not displayed]

FINDINGS: Stable right chest power port. Chronic hiatal hernia. Chronic
tortuosity of the thoracic aorta. Other mediastinal contours are
within normal limits. Visualized tracheal air column is within
normal limits. Normal lung volumes today. No pneumothorax, pulmonary
edema, pleural effusion or acute pulmonary opacity.

Degenerative changes throughout the spine with S-shaped
thoracolumbar scoliosis. No acute osseous abnormality identified.

Negative visible bowel gas pattern.
IMPRESSION: 1.  No acute cardiopulmonary abnormality.
2. Large chronic hiatal hernia.
